# Patient Record
Sex: Female | Born: 1935 | Race: Black or African American | Hispanic: No | Smoking: Never smoker
Health system: Southern US, Community
[De-identification: ages and names within clinical notes are randomized; demographics above are authoritative.]

## PROBLEM LIST (undated history)

## (undated) DIAGNOSIS — M1990 Unspecified osteoarthritis, unspecified site: Secondary | ICD-10-CM

## (undated) DIAGNOSIS — I10 Essential (primary) hypertension: Secondary | ICD-10-CM

## (undated) HISTORY — PX: TOTAL KNEE ARTHROPLASTY: SHX125

## (undated) HISTORY — PX: JOINT REPLACEMENT: SHX530

## (undated) HISTORY — PX: ABDOMINAL HYSTERECTOMY: SHX81

---

## 2001-09-10 ENCOUNTER — Encounter: Payer: Self-pay | Admitting: Family Medicine

## 2001-09-10 ENCOUNTER — Ambulatory Visit (HOSPITAL_COMMUNITY): Admission: RE | Admit: 2001-09-10 | Discharge: 2001-09-10 | Payer: Self-pay | Admitting: Family Medicine

## 2002-03-06 ENCOUNTER — Ambulatory Visit (HOSPITAL_COMMUNITY): Admission: RE | Admit: 2002-03-06 | Discharge: 2002-03-06 | Payer: Self-pay | Admitting: Family Medicine

## 2002-03-06 ENCOUNTER — Encounter: Payer: Self-pay | Admitting: Family Medicine

## 2002-05-16 ENCOUNTER — Encounter (HOSPITAL_COMMUNITY): Admission: RE | Admit: 2002-05-16 | Discharge: 2002-06-15 | Payer: Self-pay | Admitting: Family Medicine

## 2002-09-12 ENCOUNTER — Encounter: Payer: Self-pay | Admitting: Family Medicine

## 2002-09-12 ENCOUNTER — Ambulatory Visit (HOSPITAL_COMMUNITY): Admission: RE | Admit: 2002-09-12 | Discharge: 2002-09-12 | Payer: Self-pay | Admitting: Family Medicine

## 2002-12-24 ENCOUNTER — Encounter: Payer: Self-pay | Admitting: Family Medicine

## 2002-12-24 ENCOUNTER — Ambulatory Visit (HOSPITAL_COMMUNITY): Admission: RE | Admit: 2002-12-24 | Discharge: 2002-12-24 | Payer: Self-pay | Admitting: Family Medicine

## 2003-11-28 ENCOUNTER — Encounter: Admission: RE | Admit: 2003-11-28 | Discharge: 2003-11-28 | Payer: Self-pay | Admitting: Family Medicine

## 2003-12-31 ENCOUNTER — Encounter (INDEPENDENT_AMBULATORY_CARE_PROVIDER_SITE_OTHER): Payer: Self-pay | Admitting: Family Medicine

## 2004-06-11 ENCOUNTER — Inpatient Hospital Stay (HOSPITAL_COMMUNITY): Admission: RE | Admit: 2004-06-11 | Discharge: 2004-06-18 | Payer: Self-pay | Admitting: Orthopedic Surgery

## 2004-12-16 ENCOUNTER — Ambulatory Visit: Payer: Self-pay | Admitting: Orthopedic Surgery

## 2005-06-23 ENCOUNTER — Ambulatory Visit: Payer: Self-pay | Admitting: Orthopedic Surgery

## 2006-06-05 ENCOUNTER — Ambulatory Visit (HOSPITAL_COMMUNITY): Admission: RE | Admit: 2006-06-05 | Discharge: 2006-06-05 | Payer: Self-pay | Admitting: Family Medicine

## 2006-07-20 ENCOUNTER — Ambulatory Visit: Payer: Self-pay | Admitting: Family Medicine

## 2006-08-03 ENCOUNTER — Ambulatory Visit: Payer: Self-pay | Admitting: Family Medicine

## 2006-08-31 ENCOUNTER — Ambulatory Visit: Payer: Self-pay | Admitting: Family Medicine

## 2006-09-21 ENCOUNTER — Ambulatory Visit: Payer: Self-pay | Admitting: Family Medicine

## 2006-10-30 ENCOUNTER — Ambulatory Visit: Payer: Self-pay | Admitting: Family Medicine

## 2006-11-27 ENCOUNTER — Ambulatory Visit: Payer: Self-pay | Admitting: Family Medicine

## 2006-11-29 ENCOUNTER — Encounter: Payer: Self-pay | Admitting: Family Medicine

## 2006-11-29 DIAGNOSIS — F339 Major depressive disorder, recurrent, unspecified: Secondary | ICD-10-CM | POA: Insufficient documentation

## 2006-11-29 DIAGNOSIS — F411 Generalized anxiety disorder: Secondary | ICD-10-CM | POA: Insufficient documentation

## 2006-11-29 DIAGNOSIS — E669 Obesity, unspecified: Secondary | ICD-10-CM | POA: Insufficient documentation

## 2006-11-29 DIAGNOSIS — I10 Essential (primary) hypertension: Secondary | ICD-10-CM | POA: Insufficient documentation

## 2006-11-29 DIAGNOSIS — IMO0002 Reserved for concepts with insufficient information to code with codable children: Secondary | ICD-10-CM | POA: Insufficient documentation

## 2006-11-29 DIAGNOSIS — J309 Allergic rhinitis, unspecified: Secondary | ICD-10-CM | POA: Insufficient documentation

## 2006-11-29 DIAGNOSIS — F329 Major depressive disorder, single episode, unspecified: Secondary | ICD-10-CM | POA: Insufficient documentation

## 2006-11-29 DIAGNOSIS — M1990 Unspecified osteoarthritis, unspecified site: Secondary | ICD-10-CM | POA: Insufficient documentation

## 2007-01-26 ENCOUNTER — Telehealth (INDEPENDENT_AMBULATORY_CARE_PROVIDER_SITE_OTHER): Payer: Self-pay | Admitting: Family Medicine

## 2007-02-07 ENCOUNTER — Encounter (INDEPENDENT_AMBULATORY_CARE_PROVIDER_SITE_OTHER): Payer: Self-pay | Admitting: Family Medicine

## 2007-02-08 LAB — CONVERTED CEMR LAB
ALT: 19 units/L (ref 0–35)
AST: 20 units/L (ref 0–37)
Basophils Absolute: 0 10*3/uL (ref 0.0–0.1)
Basophils Relative: 0 % (ref 0–1)
Chloride: 105 meq/L (ref 96–112)
Creatinine, Ser: 0.87 mg/dL (ref 0.40–1.20)
Hemoglobin: 13.5 g/dL (ref 12.0–15.0)
Lymphocytes Relative: 35 % (ref 12–46)
MCHC: 32.4 g/dL (ref 30.0–36.0)
Monocytes Absolute: 0.5 10*3/uL (ref 0.2–0.7)
Neutro Abs: 3.9 10*3/uL (ref 1.7–7.7)
Platelets: 385 10*3/uL (ref 150–400)
RDW: 13.2 % (ref 11.5–14.0)
Sodium: 143 meq/L (ref 135–145)
TSH: 1.193 microintl units/mL (ref 0.350–5.50)
Total Bilirubin: 0.4 mg/dL (ref 0.3–1.2)
Total CHOL/HDL Ratio: 3.3
Total Protein: 7.4 g/dL (ref 6.0–8.3)
VLDL: 18 mg/dL (ref 0–40)

## 2007-02-26 ENCOUNTER — Ambulatory Visit: Payer: Self-pay | Admitting: Family Medicine

## 2007-02-26 LAB — CONVERTED CEMR LAB
Cholesterol, target level: 200 mg/dL
LDL Goal: 130 mg/dL

## 2007-03-02 ENCOUNTER — Encounter (INDEPENDENT_AMBULATORY_CARE_PROVIDER_SITE_OTHER): Payer: Self-pay | Admitting: Family Medicine

## 2007-03-05 ENCOUNTER — Ambulatory Visit (HOSPITAL_COMMUNITY): Admission: RE | Admit: 2007-03-05 | Discharge: 2007-03-05 | Payer: Self-pay | Admitting: Family Medicine

## 2007-03-05 ENCOUNTER — Encounter (INDEPENDENT_AMBULATORY_CARE_PROVIDER_SITE_OTHER): Payer: Self-pay | Admitting: Family Medicine

## 2007-03-06 ENCOUNTER — Encounter (INDEPENDENT_AMBULATORY_CARE_PROVIDER_SITE_OTHER): Payer: Self-pay | Admitting: Family Medicine

## 2007-03-06 DIAGNOSIS — E042 Nontoxic multinodular goiter: Secondary | ICD-10-CM | POA: Insufficient documentation

## 2007-03-12 ENCOUNTER — Telehealth (INDEPENDENT_AMBULATORY_CARE_PROVIDER_SITE_OTHER): Payer: Self-pay | Admitting: Family Medicine

## 2007-03-19 ENCOUNTER — Telehealth (INDEPENDENT_AMBULATORY_CARE_PROVIDER_SITE_OTHER): Payer: Self-pay | Admitting: Family Medicine

## 2007-03-30 ENCOUNTER — Ambulatory Visit: Payer: Self-pay | Admitting: Family Medicine

## 2007-04-17 ENCOUNTER — Encounter (INDEPENDENT_AMBULATORY_CARE_PROVIDER_SITE_OTHER): Payer: Self-pay | Admitting: Family Medicine

## 2007-05-03 ENCOUNTER — Encounter (INDEPENDENT_AMBULATORY_CARE_PROVIDER_SITE_OTHER): Payer: Self-pay | Admitting: Family Medicine

## 2007-05-22 ENCOUNTER — Encounter (INDEPENDENT_AMBULATORY_CARE_PROVIDER_SITE_OTHER): Payer: Self-pay | Admitting: Family Medicine

## 2007-06-11 ENCOUNTER — Telehealth (INDEPENDENT_AMBULATORY_CARE_PROVIDER_SITE_OTHER): Payer: Self-pay | Admitting: Family Medicine

## 2007-06-12 ENCOUNTER — Encounter (INDEPENDENT_AMBULATORY_CARE_PROVIDER_SITE_OTHER): Payer: Self-pay | Admitting: Family Medicine

## 2007-06-29 ENCOUNTER — Ambulatory Visit: Payer: Self-pay | Admitting: Family Medicine

## 2007-06-30 ENCOUNTER — Encounter (INDEPENDENT_AMBULATORY_CARE_PROVIDER_SITE_OTHER): Payer: Self-pay | Admitting: Family Medicine

## 2007-07-01 LAB — CONVERTED CEMR LAB
Basophils Absolute: 0 10*3/uL (ref 0.0–0.1)
Basophils Relative: 0 % (ref 0–1)
Calcium: 8.8 mg/dL (ref 8.4–10.5)
Eosinophils Absolute: 0.5 10*3/uL (ref 0.0–0.7)
Eosinophils Relative: 7 % — ABNORMAL HIGH (ref 0–5)
Glucose, Bld: 77 mg/dL (ref 70–99)
HCT: 37.9 % (ref 36.0–46.0)
MCHC: 33 g/dL (ref 30.0–36.0)
MCV: 94.8 fL (ref 78.0–100.0)
Platelets: 278 10*3/uL (ref 150–400)
RDW: 14 % (ref 11.5–14.0)
Sodium: 142 meq/L (ref 135–145)

## 2007-07-02 ENCOUNTER — Telehealth (INDEPENDENT_AMBULATORY_CARE_PROVIDER_SITE_OTHER): Payer: Self-pay | Admitting: *Deleted

## 2007-07-13 ENCOUNTER — Ambulatory Visit: Payer: Self-pay | Admitting: Family Medicine

## 2007-09-17 ENCOUNTER — Ambulatory Visit: Payer: Self-pay | Admitting: Family Medicine

## 2007-09-17 DIAGNOSIS — K219 Gastro-esophageal reflux disease without esophagitis: Secondary | ICD-10-CM | POA: Insufficient documentation

## 2007-09-18 ENCOUNTER — Encounter (INDEPENDENT_AMBULATORY_CARE_PROVIDER_SITE_OTHER): Payer: Self-pay | Admitting: Family Medicine

## 2007-10-09 ENCOUNTER — Ambulatory Visit: Payer: Self-pay | Admitting: Family Medicine

## 2007-10-18 ENCOUNTER — Telehealth (INDEPENDENT_AMBULATORY_CARE_PROVIDER_SITE_OTHER): Payer: Self-pay | Admitting: Family Medicine

## 2007-11-22 ENCOUNTER — Ambulatory Visit: Payer: Self-pay | Admitting: Family Medicine

## 2007-12-04 ENCOUNTER — Encounter (INDEPENDENT_AMBULATORY_CARE_PROVIDER_SITE_OTHER): Payer: Self-pay | Admitting: Family Medicine

## 2007-12-17 ENCOUNTER — Encounter (INDEPENDENT_AMBULATORY_CARE_PROVIDER_SITE_OTHER): Payer: Self-pay | Admitting: Family Medicine

## 2008-01-01 ENCOUNTER — Encounter (INDEPENDENT_AMBULATORY_CARE_PROVIDER_SITE_OTHER): Payer: Self-pay | Admitting: Family Medicine

## 2008-02-04 ENCOUNTER — Encounter (INDEPENDENT_AMBULATORY_CARE_PROVIDER_SITE_OTHER): Payer: Self-pay | Admitting: Family Medicine

## 2008-02-21 ENCOUNTER — Ambulatory Visit: Payer: Self-pay | Admitting: Family Medicine

## 2008-03-01 ENCOUNTER — Encounter (INDEPENDENT_AMBULATORY_CARE_PROVIDER_SITE_OTHER): Payer: Self-pay | Admitting: Family Medicine

## 2008-03-02 LAB — CONVERTED CEMR LAB
Albumin: 4.1 g/dL (ref 3.5–5.2)
CO2: 25 meq/L (ref 19–32)
Cholesterol: 184 mg/dL (ref 0–200)
Eosinophils Relative: 3 % (ref 0–5)
Glucose, Bld: 89 mg/dL (ref 70–99)
HCT: 41.9 % (ref 36.0–46.0)
Hemoglobin: 13.6 g/dL (ref 12.0–15.0)
Lymphocytes Relative: 37 % (ref 12–46)
Lymphs Abs: 2.3 10*3/uL (ref 0.7–4.0)
Monocytes Relative: 7 % (ref 3–12)
Platelets: 319 10*3/uL (ref 150–400)
RBC: 4.32 M/uL (ref 3.87–5.11)
Sodium: 142 meq/L (ref 135–145)
Total Bilirubin: 0.5 mg/dL (ref 0.3–1.2)
Total Protein: 7.2 g/dL (ref 6.0–8.3)
Triglycerides: 67 mg/dL (ref <150)
VLDL: 13 mg/dL (ref 0–40)
WBC: 6.3 10*3/uL (ref 4.0–10.5)

## 2008-03-03 ENCOUNTER — Telehealth (INDEPENDENT_AMBULATORY_CARE_PROVIDER_SITE_OTHER): Payer: Self-pay | Admitting: *Deleted

## 2008-03-07 ENCOUNTER — Ambulatory Visit: Payer: Self-pay | Admitting: Family Medicine

## 2008-03-07 ENCOUNTER — Telehealth (INDEPENDENT_AMBULATORY_CARE_PROVIDER_SITE_OTHER): Payer: Self-pay | Admitting: *Deleted

## 2008-03-13 ENCOUNTER — Encounter (INDEPENDENT_AMBULATORY_CARE_PROVIDER_SITE_OTHER): Payer: Self-pay | Admitting: Family Medicine

## 2008-03-13 ENCOUNTER — Ambulatory Visit (HOSPITAL_COMMUNITY): Admission: RE | Admit: 2008-03-13 | Discharge: 2008-03-13 | Payer: Self-pay | Admitting: Family Medicine

## 2008-04-03 ENCOUNTER — Ambulatory Visit: Payer: Self-pay | Admitting: Family Medicine

## 2008-04-03 LAB — CONVERTED CEMR LAB: Hemoglobin: 13.2 g/dL

## 2008-06-18 ENCOUNTER — Telehealth (INDEPENDENT_AMBULATORY_CARE_PROVIDER_SITE_OTHER): Payer: Self-pay | Admitting: *Deleted

## 2008-06-24 ENCOUNTER — Telehealth (INDEPENDENT_AMBULATORY_CARE_PROVIDER_SITE_OTHER): Payer: Self-pay | Admitting: Family Medicine

## 2008-07-08 ENCOUNTER — Ambulatory Visit: Payer: Self-pay | Admitting: Family Medicine

## 2008-08-07 ENCOUNTER — Telehealth (INDEPENDENT_AMBULATORY_CARE_PROVIDER_SITE_OTHER): Payer: Self-pay | Admitting: *Deleted

## 2008-10-06 ENCOUNTER — Ambulatory Visit: Payer: Self-pay | Admitting: Family Medicine

## 2008-10-07 ENCOUNTER — Encounter (INDEPENDENT_AMBULATORY_CARE_PROVIDER_SITE_OTHER): Payer: Self-pay | Admitting: Family Medicine

## 2008-10-07 LAB — CONVERTED CEMR LAB
CO2: 23 meq/L (ref 19–32)
Creatinine, Ser: 0.81 mg/dL (ref 0.40–1.20)
Eosinophils Relative: 7 % — ABNORMAL HIGH (ref 0–5)
Glucose, Bld: 78 mg/dL (ref 70–99)
HCT: 36.3 % (ref 36.0–46.0)
Hemoglobin: 11.9 g/dL — ABNORMAL LOW (ref 12.0–15.0)
Lymphocytes Relative: 43 % (ref 12–46)
Lymphs Abs: 1.9 10*3/uL (ref 0.7–4.0)
Monocytes Absolute: 0.3 10*3/uL (ref 0.1–1.0)
Total Bilirubin: 0.3 mg/dL (ref 0.3–1.2)
Total Protein: 6.5 g/dL (ref 6.0–8.3)
WBC: 4.4 10*3/uL (ref 4.0–10.5)

## 2008-10-27 ENCOUNTER — Telehealth (INDEPENDENT_AMBULATORY_CARE_PROVIDER_SITE_OTHER): Payer: Self-pay | Admitting: *Deleted

## 2009-01-05 ENCOUNTER — Ambulatory Visit: Payer: Self-pay | Admitting: Family Medicine

## 2009-01-05 LAB — CONVERTED CEMR LAB: Hemoglobin: 12.8 g/dL

## 2009-02-24 ENCOUNTER — Ambulatory Visit: Payer: Self-pay | Admitting: Family Medicine

## 2009-02-24 DIAGNOSIS — R609 Edema, unspecified: Secondary | ICD-10-CM | POA: Insufficient documentation

## 2009-04-06 ENCOUNTER — Ambulatory Visit: Payer: Self-pay | Admitting: Family Medicine

## 2009-04-07 ENCOUNTER — Encounter (INDEPENDENT_AMBULATORY_CARE_PROVIDER_SITE_OTHER): Payer: Self-pay | Admitting: Family Medicine

## 2009-04-07 LAB — CONVERTED CEMR LAB
ALT: 18 units/L (ref 0–35)
AST: 30 units/L (ref 0–37)
Albumin: 3.8 g/dL (ref 3.5–5.2)
BUN: 21 mg/dL (ref 6–23)
Calcium: 9 mg/dL (ref 8.4–10.5)
Chloride: 109 meq/L (ref 96–112)
Eosinophils Relative: 8 % — ABNORMAL HIGH (ref 0–5)
HCT: 36.9 % (ref 36.0–46.0)
HDL: 67 mg/dL (ref >39)
Hemoglobin: 12.1 g/dL (ref 12.0–15.0)
LDL Cholesterol: 110 mg/dL — ABNORMAL HIGH (ref 0–99)
Lymphocytes Relative: 36 % (ref 12–46)
Lymphs Abs: 1.6 10*3/uL (ref 0.7–4.0)
Neutro Abs: 2.2 10*3/uL (ref 1.7–7.7)
Platelets: 286 10*3/uL (ref 150–400)
Potassium: 4 meq/L (ref 3.5–5.3)
Sodium: 141 meq/L (ref 135–145)
TSH: 0.818 microintl units/mL (ref 0.350–4.500)
Total Protein: 7.2 g/dL (ref 6.0–8.3)
WBC: 4.5 10*3/uL (ref 4.0–10.5)

## 2009-04-08 ENCOUNTER — Encounter (INDEPENDENT_AMBULATORY_CARE_PROVIDER_SITE_OTHER): Payer: Self-pay | Admitting: Family Medicine

## 2009-05-12 ENCOUNTER — Telehealth (INDEPENDENT_AMBULATORY_CARE_PROVIDER_SITE_OTHER): Payer: Self-pay | Admitting: *Deleted

## 2009-05-19 ENCOUNTER — Ambulatory Visit: Payer: Self-pay | Admitting: Family Medicine

## 2009-07-09 ENCOUNTER — Encounter (INDEPENDENT_AMBULATORY_CARE_PROVIDER_SITE_OTHER): Payer: Self-pay | Admitting: Family Medicine

## 2009-07-13 ENCOUNTER — Ambulatory Visit: Payer: Self-pay | Admitting: Family Medicine

## 2009-07-13 DIAGNOSIS — H8110 Benign paroxysmal vertigo, unspecified ear: Secondary | ICD-10-CM | POA: Insufficient documentation

## 2009-07-14 LAB — CONVERTED CEMR LAB
CO2: 22 meq/L (ref 19–32)
Chloride: 108 meq/L (ref 96–112)
Potassium: 3.6 meq/L (ref 3.5–5.3)
Sodium: 144 meq/L (ref 135–145)

## 2011-05-06 NOTE — Discharge Summary (Signed)
NAME:  Dana Ellis, Dana Ellis                         ACCOUNT NO.:  [id number]   MEDICAL RECORD NO.:  [id number]                   PATIENT TYPE:  INP   LOCATION:  A340                                 FACILITY:  [facility]   PHYSICIAN:  Carole Civil, M.D.           DATE OF BIRTH:  11/16/1934   DATE OF ADMISSION:  06/10/2004  DATE OF DISCHARGE:                                 DISCHARGE SUMMARY   Ms. [name] was admitted on June 23.  She underwent uncomplicated  right total knee.  She tolerated that well.  She had a good postoperative  course with no complications.  She was going to go to rehabilitation; they  did not have a bed, so we extended her stay for two days and she will be  discharged.   ADMITTING DIAGNOSIS:  Osteoarthritis, right knee.   DISCHARGE DIAGNOSIS:  Osteoarthritis, right knee.   Discharging hemoglobin was 9.5, potassium was 3.8.   Follow-up should be one week after discharge from the hospital to have her  staples taken out.  She will have home health and home physical therapy.  She is weightbearing as tolerated.  She will be on Lovenox 40 mg q. day for  three weeks.   DISCHARGE MEDICATIONS:  1. Norvasc 10 mg a day.  2. Feosol 45 mg b.i.d.  3. Ativan 0.5 mg t.i.d.  4. Skelaxin 800 mg t.i.d.  5. Zaroxolyn 2.5 mg daily.  6. Protonix 40 mg daily.  7. Potassium 20 mEq b.i.d.  8. Darvocet one every four hours.   DISPOSITION:  Home.   CONDITION:  Improved.     ___________________________________________                                         Carole Civil, M.D.   SEH/MEDQ  D:  06/18/2004  T:  06/18/2004  Job:  [id number]

## 2011-05-06 NOTE — H&P (Signed)
NAME:  Dana Ellis, Dana Ellis                         ACCOUNT NO.:  [id number]   MEDICAL RECORD NO.:  [id number]                   PATIENT TYPE:  AMB   LOCATION:  DAY                                  FACILITY:  [facility]   PHYSICIAN:  Carole Civil, M.D.           DATE OF BIRTH:  1934/06/20   DATE OF ADMISSION:  DATE OF DISCHARGE:                                HISTORY & PHYSICAL   CHIEF COMPLAINT:  Right knee pain.   HISTORY:  Dana Ellis is now 77 years old.  She is complaining of right  knee pain for several years, worsening in severity.  Located primarily on  the lateral compartment.  The pain has an aching, dull quality to it.  It  has become severe and is constant.  It is in the setting of osteoarthritis  modified by activity.  The latest signs and symptoms are crepitus and  deformity of the limb.   REVIEW OF SYSTEMS:  Poor gait pattern.  Thyroid disease.  Anxiety.  Allergies and sinusitis.  General, respiratory, GI, urinary, skin, ears,  nose, and throat, lymphatic, no complaints.  CARDIOVASCULAR:  Complains of  circulation problems related to coolness in the extremities.   ALLERGIES:  ACE inhibitors.   PAST MEDICAL HISTORY:  1. Hypertension.  2. Arthritis.  3. Joint swelling.   PAST SURGICAL HISTORY:  Left total knee replacement done several years ago  by myself.   PHARMACY:  [facility].   LISTED MEDICATIONS:  Norvasc.  Skelaxin.  Evista.  Lorazepam.  Potassium.  Zaroxylan.  Arthrotec.  Aspirin.  Prevacid.  Tylenol w/Codeine.  Bisoprolol  HCTZ.   FAMILY HISTORY:  Noted.  Heart disease.  Arthritis.   SOCIAL HISTORY:  Family history, Dr. Berdine Addison.  Marital status:  Single.  Employment:  CNA.  Social habits:  None.  Caffeine use:  Yes.  Highest grade  completed in school:  12.   PHYSICAL EXAMINATION:  VITAL SIGNS:  Weight 248.  Pulse 78, respiratory rate  20.  GENERAL APPEARANCE:  Normal development and nutrition.  Knee deformity is  noted.  Grooming normal.  Body  habitus:  Medium-to-large frame.  CARDIOVASCULAR:  Observation.  No swelling or varicose veins.  Palpation:  Pulses and temperature normal.  No edema or tenderness.  LYMPH NODES:  In the neck area, normal.  Groin area, normal.  Popliteal  space, normal.  MUSCULOSKELETAL:  Gait and station:  Valgus knee on the right, poor gait  pattern with pain.  Inspection and palpation of the knee, lateral  compartment pain and crepitus.  Malalignment with valgus:  Tenderness, as  noted.  Range of motion:  There is a flexion contracture.  She has 100  degrees of flexion.  Pain limits some of the motion.  The joint is stable.  Strength and muscle tone are normal.  The left knee had total knee  replacement incision noted in the anterior knee.  Flexion 115 degrees.  Extension near 0.  SKIN:  Otherwise normal.  NEURO:  Coordination is good.  Reflexes are normal.  Sensation, touch, and  pain normal.  MENTAL STATUS:  She is awake, alert and oriented x 3.  Mood and affect  pleasant.   DIAGNOSIS:  Osteoarthritis, right knee.   PLAN:  Right total knee replacement.  Admission x4 days at [facility].     ___________________________________________                                         Carole Civil, M.D.   SEH/MEDQ  D:  06/10/2004  T:  06/10/2004  Job:  [id number]

## 2011-05-06 NOTE — Discharge Summary (Signed)
NAME:  Dana Ellis, Dana Ellis                         ACCOUNT NO.:  [id number]   MEDICAL RECORD NO.:  [id number]                   PATIENT TYPE:  INP   LOCATION:  A340                                 FACILITY:  [facility]   PHYSICIAN:  Carole Civil, M.D.           DATE OF BIRTH:  21-Jun-1934   DATE OF ADMISSION:  06/11/2004  DATE OF DISCHARGE:                                 DISCHARGE SUMMARY   ADMISSION DIAGNOSIS:  Osteoarthritis of the right knee.   DISCHARGE DIAGNOSIS:  Osteoarthritis of the right knee.   PROCEDURE:  Right total knee replacement performed on June 11, 2004.  Right  total knee arthroplasty, Osteonics Scorpio total knee, posterior stabilizer.  Components:  Size 7 femur, 7 tibia, 5 patella and 10 mm insert with a flexed  configuration.   ESTIMATED BLOOD LOSS:  Zero.   TOURNIQUET TIME:  1 hour and 29 minutes.   ANESTHESIA:  Spinal anesthesia was used.   HOSPITAL COURSE:  The patient was admitted on June 11, 2004 and had an  uncomplicated total knee replacement.  She was admitted back to the floor  and followed a routine postoperative total knee protocol and progressed  normally.  She was referred for rehab and approved.   DISCHARGE MEDICATIONS:  1. Norvasc 10 mg daily.  2. Lovenox 30 mg subcutaneous q.12h until discharged from rehab.  3. Ativan 0.5 mg t.i.d.  4. Skelaxin 800 mg t.i.d.  5. Zaroxolyn 2.5 mg daily.  6. Protonix 40 mg daily.  7. Potassium 20 mEq b.i.d.  8. Darvocet one q.4h.  9. Evista 60 mg daily.  10.      Reglan 10 mg q.6h p.r.n.   SPECIAL INSTRUCTIONS:  1. Staples out on postoperative day number twelve which will be June 23, 2004.  2. Follow postoperative total knee protocol.  3. CPM advance as tolerated.  4. Full weight bearing.  5. Passive and active assisted range of motion exercises.   FOLLOW UP:  Follow-up in the office in the fourth week of July.  Call [phone] for an appointment.     ___________________________________________                                         Carole Civil, M.D.   SEH/MEDQ  D:  06/15/2004  T:  06/15/2004  Job:  [id number]

## 2011-05-06 NOTE — Op Note (Signed)
NAME:  Dana Ellis, Dana Ellis                         ACCOUNT NO.:  [id number]   MEDICAL RECORD NO.:  [id number]                   PATIENT TYPE:  AMB   LOCATION:  DAY                                  FACILITY:  [facility]   PHYSICIAN:  Carole Civil, M.D.           DATE OF BIRTH:  09/28/1934   DATE OF PROCEDURE:  06/11/2004  DATE OF DISCHARGE:                                 OPERATIVE REPORT   INDICATIONS FOR PROCEDURE:  Knee pain.   HISTORY:  This is a 77 year old female status post left total knee  presenting with right knee pain, end-stage arthritis.  Failed conservative  care and presented for a right total knee replacement.   PREOPERATIVE DIAGNOSIS:  Osteoarthritis of the right knee.   POSTOPERATIVE DIAGNOSIS:  Osteoarthritis of the right knee.   PROCEDURE:  Right total knee arthroplasty.   COMPONENTS USED:  [employer] Osteonics Scorpio total knee and posterior  stabilized knee replacement.  We used a 7 femur, 7 tibia, 5 patella, and a  10-mm polyethylene insert with a flexed configuration.   SURGEON:  Carole Civil, M.D.   FINDINGS:  Degenerative arthritis of the cartilaginous surfaces of the  tibia, femur, and patella as well as approximately 10 loose bodies.   ESTIMATED BLOOD LOSS:  Zero.   TOURNIQUET TIME:  1 hour and 29 minutes.   COUNTS:  Sponge and needle counts were correct.   DESCRIPTION OF PROCEDURE:  In the preoperative holding area, the medical  record was reviewed, including the consent and history.  My initials were  placed over the surgical site as indicated by the patient prior to any  sedatives given.  Preoperative antibiotics of 1 g of Ancef were given, and  the patient was taken to the operating suite.   The patient was given a spinal anesthetic and placed supine on the operating  table, and a Foley catheter was placed.  The right knee was prepped and  draped using sterile technique, and a time out was taken.  Everyone  concurred on the procedure of  right total knee replacement on Dana Ellis.  The right lower extremity was exsanguinated with a 4-inch Esmarch,  and the tourniquet was inflated to 300 mmHg.   An incision was made over the right knee centered over the patella.  The  dissection was carried through the subcutaneous tissues to the extensor  mechanism.  The joint was entered via medial arthrotomy.  A complete  synovectomy was done.  The cruciates and menisci were removed.  A medial  periosteal sleeve was elevated.  The knee was flexed to 90 degrees and  subluxated forward.   The tibial guide was set to neutral alignment with a 5-degree posterior  slope.  The stylus was set for 10 mm resection from the normal medial side.  I measured the tibial base plate to a size 7.  The femoral canal was then  entered with  a three-eighths inch drill bit and decompressed with suction  and fluted guide rod.  A 10-mm distal resection was made.  The femur was  measured to a size 7, and the four-in-one cutting block was used to make  four femoral cuts with 3 degrees of external rotation.  The box cut was  made.   A trial reduction was done.  The cuts were acceptable.  The tibial punches  were made.  The patella was measured for thickness and cut down to a size  12.  A total of 11 mm of bone was resected.  A trial was placed and the  tracking was checked.  No lateral release was necessary.   The knee was irrigated.  The bone was dried.  The prosthesis was cemented  into place using a size 7 femur, size 7 tibia, size 5 patella, 12-mm  polyethylene flexed insert.   A Hemovac drain was placed along with a subcutaneous pain pump.  The knee  was closed in layers with #1 Bralon, 0 and 2-0 Vicryl, and skin staples.  CryoCuff was placed over sterile dressings.   The patient was moved to a regular bed and taken to the recovery room in  stable condition.   POSTOPERATIVE PLANS:  Routine postoperative total knee protocol.       ___________________________________________                                            Carole Civil, M.D.   SEH/MEDQ  D:  06/11/2004  T:  06/11/2004  Job:  [id number]

## 2011-05-06 NOTE — Group Therapy Note (Signed)
NAME:  Dana Ellis, Dana Ellis                         ACCOUNT NO.:  [id number]   MEDICAL RECORD NO.:  [id number]                   PATIENT TYPE:  INP   LOCATION:  A340                                 FACILITY:  [facility]   PHYSICIAN:  Carole Civil, M.D.           DATE OF BIRTH:  [birth year]   DATE OF PROCEDURE:  06/13/2004  DATE OF DISCHARGE:                                   PROGRESS NOTE   SUBJECTIVE:  Hemoglobin today is 9.4.  Preoperative hemoglobin was 12.9.  Potassium is 2.7.  Hemovac drain was removed.  Pain pump catheter is still  functioning.  Cuff is in place.  Knee immobilizer was in place.  The patient  wore CPM nine hours yesterday.   ASSESSMENT:  Pain level is acceptable.   PLAN:  Recommend 40 mEq of potassium x2 and then continue with b.i.d.  potassium.  Cuff can be removed at this time and used on a p.r.n. basis.      ___________________________________________                                            Carole Civil, M.D.   SEH/MEDQ  D:  06/13/2004  T:  06/13/2004  Job:  [id number]

## 2011-05-06 NOTE — Group Therapy Note (Signed)
NAME:  Dana Ellis, Dana Ellis                         ACCOUNT NO.:  [id number]   MEDICAL RECORD NO.:  [id number]                   PATIENT TYPE:  INP   LOCATION:  A340                                 FACILITY:  [facility]   PHYSICIAN:  Carole Civil, M.D.           DATE OF BIRTH:  1934/03/13   DATE OF PROCEDURE:  06/12/2004  DATE OF DISCHARGE:                                   PROGRESS NOTE   CHIEF COMPLAINT:  Right total knee replacement.   HISTORY:  This is a 77 year old female who is postop day #1 status post  right total knee replacement.  She is doing well.  She did not like the way  Vicodin made her feel but she is having minimal pain.  She is taking Tylenol  and has hit her PCA pump on 2 occasions.   Hemoglobin is 10.7.  Her knee looks good.  Her drain output is decreasing.  She is neurovascularly intact.  She is awake and alert.   Recommend add iron and potassium to her medications and continue physical  therapy as per protocol.      ___________________________________________                                            Carole Civil, M.D.   SEH/MEDQ  D:  06/12/2004  T:  06/12/2004  Job:  [id number]

## 2011-05-06 NOTE — Consult Note (Signed)
NAME:  Dana Ellis, INABNIT                         ACCOUNT NO.:  [id number]   MEDICAL RECORD NO.:  [id number]                   PATIENT TYPE:  INP   LOCATION:  A340                                 FACILITY:  [facility]   PHYSICIAN:  Barrie Folk. Berdine Addison, M.D.                DATE OF BIRTH:  [birth year]   DATE OF CONSULTATION:  06/12/2004  DATE OF DISCHARGE:                                   CONSULTATION   The patient is a 77 year old widow, gravida 1, para 1, AB 0 black female  from [place], [place].  The patient was admitted by Dr. Arther Abbott, orthopedics, for total right knee replacement due to progressive  deterioration and severe pain over several years.  The patient underwent a  total right knee replacement on June 24, [id number], without complications by Dr.  Arther Abbott.   History is positive for multiple steroid injections, use of nonsteroidal  anti-inflammatory agents, and other analgesics for pain with tentative  treatment of her right knee chronic pain.  History is positive for swelling  and severe stiffness of her right knee.  The patient found it difficult to  go up stairs due to severity of stiffness of her right knee.   Medical history is also positive for hypertension, hyperlipidemia, hiatal  hernia, and surgical menopause.  Medical history is negative for diabetes,  tuberculosis, cancer, sickle cell, asthma, and seizure disorder.   PRESCRIBED MEDICATIONS:  '  1. Norvasc 10 mg p.o. daily.  2. Zaroxolyn 2.5 mg p.o. every day.  3. K-Dur 20 mEq 1-1/2 tablets p.o. every day.  4. Lorazepam 0.5 mg p.o. t.i.d.  5. Prevacid 30 mg p.o. daily.  6. Evista 60 mg p.o. daily.   ALLERGIES:  The patient is allergic to ACE INHIBITOR.   PAST MEDICAL HISTORY:  1. Hospitalization for left knee replacement by Dr. Aline Brochure in September     1998.  2. Hysterectomy at [facility].   HABITS:  Negative for tobacco, ethanol, or street drugs.   SEXUALLY TRANSMITTED DISEASES:   Negative for gonorrhea, syphilis, herpes,  and HIV infections.   FAMILY HISTORY:  Mother deceased at age 55 secondary to heart disease.  Father deceased in his 8s secondary to complications of stroke.  One sister  deceased at age 7 secondary to complications of stroke; two brothers are  living, on in his 8s with history of hypertension, colon cancer; one  brother in his 83s is in good health.  On son is living in his 56s in good  health.   REVIEW OF SYSTEMS:  Negative for epistaxis, chronic headache, chest pain,  shortness of breath, nausea, vomiting, diarrhea, constipation, syncope,  dizziness, melena, gross hematuria, and dysuria.  Review of Systems is  positive for episodic nervous spells, hot flashes, insomnia, etc.   PHYSICAL EXAMINATION:  GENERAL:  The patient is an elderly, overweight,  medium-height, alert, black female in no apparent  respiratory distress.  VITAL SIGNS:  Blood pressure 145/68, pulse 55, respirations 20, temperature  97.6.  HEENT:  Head: Noncontributory.  Ears:  Normal auricles.  Eyes:  Lids  negative for ptosis.  Sclerae white.  Pupils equal, round, and reactive to  light.  Extraocular movements intact.  Nose: Negative for discharge.  Mouth:  Positive upper and lower dentures.  No oral lesions.  Posterior pharynx  benign.  NECK:  Negative for lymphadenopathy or thyromegaly.  No carotid bruits on  auscultation.  LUNGS:  Clear.  HEART:  Audible S1 and S2 without murmur.  Rate equals 64 and regular.  ABDOMEN:  Obese.  Soft and nontender in all four quadrants.  Positive old  healed mid hypogastric surgical scar.  No palpable masses.  No organomegaly.  PELVIC:  Deferred.  RECTAL:  Deferred.  EXTREMITIES:  Right knee positive for orthopedic apparatus intact.  Hemovac  drainage of dark blood.  Left knee positive for old healed surgical scar.  Flex and extend without tenderness.  No discoloration of extremities.  Palpable dorsalis pedis bilaterally.   NEUROLOGIC:  Alert and oriented to person, place, and time,  Cranial nerves  II-XII appeared intact.   LABORATORY AND X-RAY DATA:  Hemoglobin 10.7, hematocrit 31.0 on June 12, 2004.  Labs of June 09, 2004: Hemoglobin 12.9, hematocrit 37.6.  Labs on  June 09, 2004:  Sodium 140, potassium 3.7, chloride 104, CO2 31, glucose 88,  BUN 11, creatinine 0.7.  Labs of June 12, 2004: Sodium 138, potassium 3.2,  chloride 102, CO2 32, glucose 117, BUN 5, creatinine 0.7.   PRIMARY IMPRESSION:  Status post acute left knee replacement secondary to  severe degenerative joint disease.   SECONDARY DIAGNOSES:  1. Mild hypokalemia.  2. Hypertension.  3. Hyperlipidemia.  4. Surgical menopause.  5. Chronic anxiety.  6. Chronic mild anemia.   PLAN:  1. Potassium supplements.  2. Watch hemoglobin and hematocrit daily as ordered by orthopedist.  3. Continue protocol for postop knee plan.  4. Norvasc 10 mg p.o. daily.  5. Zaroxolyn 2.5 mg p.o. every day.  6. Lorazepam 0.5 mg p.o. t.i.d.  7. Prevacid 30 mg p.o. daily.  8. Evista 60 mg p.o. daily.  9. Analgesics for pain.  10.      Iron supplements.  11.      Continue IV fluids.  12.      Foley catheter, watch I's and O's.  13.      Lovenox 30 mg subcutaneously b.i.d.  14.      Tylenol for temperature with fever 101 or greater.      ___________________________________________                                            Barrie Folk. Berdine Addison, M.D.   Armanda Heritage  D:  06/12/2004  T:  06/12/2004  Job:  [id number]

## 2011-05-06 NOTE — Group Therapy Note (Signed)
NAME:  Dana Ellis, Dana Ellis                         ACCOUNT NO.:  [id number]   MEDICAL RECORD NO.:  [id number]                   PATIENT TYPE:  INP   LOCATION:  A340                                 FACILITY:  [facility]   PHYSICIAN:  Carole Civil, M.D.           DATE OF BIRTH:  [birth year]   DATE OF PROCEDURE:  06/17/2004  DATE OF DISCHARGE:                                   PROGRESS NOTE   [name] was scheduled for discharge yesterday.  She is now postop day  #6.  She had a right total knee replacement.  There are no beds at [facility], and she will have to stay in the hospital until it is time for her to  be discharged to home.  She is in stable condition.  Her hemoglobin remains  in the 9.5 with a hematocrit of 27.  Potassium is 3.8.  Stable condition.  Continue physical therapy.  Advance as tolerated.      ___________________________________________                                            Carole Civil, M.D.   SEH/MEDQ  D:  06/17/2004  T:  06/17/2004  Job:  [id number]

## 2012-09-14 ENCOUNTER — Emergency Department (HOSPITAL_COMMUNITY): Payer: Medicare Other

## 2012-09-14 ENCOUNTER — Emergency Department (HOSPITAL_COMMUNITY)
Admission: EM | Admit: 2012-09-14 | Discharge: 2012-09-14 | Disposition: A | Discharge status: Home / Self Care | Payer: Medicare Other | Attending: Emergency Medicine | Admitting: Emergency Medicine

## 2012-09-14 ENCOUNTER — Encounter (HOSPITAL_COMMUNITY): Payer: Self-pay | Admitting: *Deleted

## 2012-09-14 DIAGNOSIS — Z79899 Other long term (current) drug therapy: Secondary | ICD-10-CM | POA: Insufficient documentation

## 2012-09-14 DIAGNOSIS — I10 Essential (primary) hypertension: Secondary | ICD-10-CM | POA: Insufficient documentation

## 2012-09-14 DIAGNOSIS — F0390 Unspecified dementia without behavioral disturbance: Secondary | ICD-10-CM | POA: Insufficient documentation

## 2012-09-14 HISTORY — DX: Essential (primary) hypertension: I10

## 2012-09-14 LAB — COMPREHENSIVE METABOLIC PANEL
Albumin: 3.7 g/dL (ref 3.5–5.2)
Alkaline Phosphatase: 97 U/L (ref 39–117)
BUN: 28 mg/dL — ABNORMAL HIGH (ref 6–23)
Chloride: 100 mEq/L (ref 96–112)
Creatinine, Ser: 1.01 mg/dL (ref 0.50–1.10)
GFR calc Af Amer: 61 mL/min — ABNORMAL LOW (ref >90)
Glucose, Bld: 115 mg/dL — ABNORMAL HIGH (ref 70–99)
Potassium: 3.5 mEq/L (ref 3.5–5.1)
Total Bilirubin: 0.3 mg/dL (ref 0.3–1.2)
Total Protein: 7.7 g/dL (ref 6.0–8.3)

## 2012-09-14 LAB — CBC WITH DIFFERENTIAL/PLATELET
Basophils Relative: 1 % (ref 0–1)
Eosinophils Absolute: 0.2 10*3/uL (ref 0.0–0.7)
HCT: 34.1 % — ABNORMAL LOW (ref 36.0–46.0)
Hemoglobin: 11.5 g/dL — ABNORMAL LOW (ref 12.0–15.0)
Lymphs Abs: 1.1 10*3/uL (ref 0.7–4.0)
MCH: 30.9 pg (ref 26.0–34.0)
MCHC: 33.7 g/dL (ref 30.0–36.0)
MCV: 91.7 fL (ref 78.0–100.0)
Monocytes Absolute: 0.2 10*3/uL (ref 0.1–1.0)
Monocytes Relative: 4 % (ref 3–12)
RBC: 3.72 MIL/uL — ABNORMAL LOW (ref 3.87–5.11)

## 2012-09-14 LAB — URINALYSIS, ROUTINE W REFLEX MICROSCOPIC
Glucose, UA: NEGATIVE mg/dL
Ketones, ur: NEGATIVE mg/dL
Leukocytes, UA: NEGATIVE
Nitrite: NEGATIVE
Protein, ur: NEGATIVE mg/dL

## 2012-09-14 NOTE — ED Provider Notes (Cosign Needed)
History   This chart was scribed for Dana Diego, MD by Shona Needles. The patient was seen in room APA04/APA04. Patient's care was started at 1824.  CSN: [id number]  Arrival date & time 09/14/12  Dana Ellis   First MD Initiated Contact with Patient 09/14/12 1838      Chief Complaint  Patient presents with  . Altered Mental Status   Patient is a 78 y.o. female presenting with altered mental status. The history is provided by the patient and a relative. No language interpreter was used.  Altered Mental Status This is a new problem. The current episode started 3 to 5 hours ago. The problem occurs constantly. The problem has not changed since onset.Pertinent negatives include no chest pain, no abdominal pain and no headaches. She has tried nothing for the symptoms. The treatment provided no relief.    Dana Ellis is a 78 y.o. female who accompanied by family presents to the Emergency Department because of 5 hours of unchanged moderate constant confusion. Family denotes associated disorientation to time and forgetfulness. Pt is typically healthy and currently displaying baseline health. Pt denies fever, chills, emesis, nausea, rash, and cough.  Pt list PCP as Dr. [name]  Past Medical History  Diagnosis Date  . Hypertension     Past Surgical History  Procedure Date  . Abdominal hysterectomy   . Joint replacement   . Total knee arthroplasty     History reviewed. No pertinent family history.  History  Substance Use Topics  . Smoking status: Never Smoker   . Smokeless tobacco: Not on file  . Alcohol Use: No   Review of Systems  Constitutional: Negative for fatigue.  HENT: Negative for congestion, sinus pressure and ear discharge.   Eyes: Negative for discharge.  Respiratory: Negative for cough.   Cardiovascular: Negative for chest pain.  Gastrointestinal: Negative for abdominal pain and diarrhea.  Genitourinary: Negative for frequency and hematuria.  Musculoskeletal: Negative  for back pain.  Skin: Negative for rash.  Neurological: Negative for seizures and headaches.  Hematological: Negative.   Psychiatric/Behavioral: Positive for confusion and altered mental status. Negative for hallucinations.    Allergies  Ace inhibitors  Home Medications   Current Outpatient Rx  Name Route Sig Dispense Refill  . ACETAMINOPHEN 500 MG PO TABS Oral Take 1,000 mg by mouth daily as needed. For pain    . ASPIRIN EC 81 MG PO TBEC Oral Take 81 mg by mouth every morning.    Marland Kitchen CALCIUM CARBONATE-VITAMIN D 500-200 MG-UNIT PO TABS Oral Take 1 tablet by mouth 2 (two) times daily.    Marland Kitchen CETIRIZINE HCL 10 MG PO TABS Oral Take 10 mg by mouth every morning.    Marland Kitchen CITALOPRAM HYDROBROMIDE 10 MG PO TABS Oral Take 10 mg by mouth every morning.    . FUROSEMIDE 40 MG PO TABS Oral Take 40 mg by mouth every morning. *Take one tablet 30 minutes after taking Metolazone*    . MELOXICAM 7.5 MG PO TABS Oral Take 7.5 mg by mouth every morning.    Marland Kitchen METOLAZONE 2.5 MG PO TABS Oral Take 2.5 mg by mouth every morning. *Take one tablet by mouth daily 30 minutes prior to taking LASIX (Furosemide)    . OLMESARTAN MEDOXOMIL 20 MG PO TABS Oral Take 20 mg by mouth every morning.    Marland Kitchen OMEPRAZOLE 20 MG PO CPDR Oral Take 20 mg by mouth every morning.    Marland Kitchen POTASSIUM CHLORIDE CRYS ER 20 MEQ PO TBCR Oral Take  20-40 mEq by mouth every morning. *Alternate taking one tablet daily and taking two tablets daily*    . PRAVASTATIN SODIUM 40 MG PO TABS Oral Take 40 mg by mouth every morning.      BP 180/70  Pulse 60  Temp 98.5 F (36.9 C) (Oral)  Resp 20  Ht 5\' 5"  (1.651 m)  SpO2 98%  Physical Exam  Constitutional: She appears well-developed and well-nourished.  HENT:  Head: Normocephalic.  Right Ear: External ear normal.  Left Ear: External ear normal.  Nose: Nose normal.  Mouth/Throat: Oropharynx is clear and moist.  Eyes: EOM are normal. Pupils are equal, round, and reactive to light. Right eye exhibits no  discharge. Left eye exhibits no discharge.  Neck: Normal range of motion. Neck supple. No tracheal deviation present.       No nuchal rigidity no meningeal signs  Cardiovascular: Normal rate and regular rhythm.   Pulmonary/Chest: Effort normal and breath sounds normal. No stridor. No respiratory distress. She has no wheezes. She has no rales.  Abdominal: Soft. She exhibits no distension and no mass. There is no tenderness. There is no rebound and no guarding.  Musculoskeletal: Normal range of motion. She exhibits no edema and no tenderness.  Neurological: She is alert. She has normal reflexes. No cranial nerve deficit. Coordination normal.       Not oriented to time.  Skin: Skin is warm. No rash noted. She is not diaphoretic. No erythema. No pallor.       No pettechia no purpura    ED Course  Procedures (including critical care time) DIAGNOSTIC STUDIES: Oxygen Saturation is 98% on room air, normal by my interpretation.    COORDINATION OF CARE: 18:42- Evaluated Pt. Pt is awake and alert, though not oriented to time. 18:48- Ordered CT Head Wo Contrast 1 time imaging. 18:48- Ordered CBC with Differential Once. 18:48- Ordered Comprehensive metabolic panel STAT. 18:48- Ordered Urinalysis, Routine w reflex microscopic Once.  Labs Reviewed  CBC WITH DIFFERENTIAL - Abnormal; Notable for the following:    RBC 3.72 (*)     Hemoglobin 11.5 (*)     HCT 34.1 (*)     All other components within normal limits  COMPREHENSIVE METABOLIC PANEL - Abnormal; Notable for the following:    Glucose, Bld 115 (*)     BUN 28 (*)     GFR calc non Af Amer 53 (*)     GFR calc Af Amer 61 (*)     All other components within normal limits  URINALYSIS, ROUTINE W REFLEX MICROSCOPIC   Ct Head Wo Contrast  09/14/2012  *RADIOLOGY REPORT*  Clinical Data: Altered mental status  CT HEAD WITHOUT CONTRAST  Technique:  Contiguous axial images were obtained from the base of the skull through the vertex without contrast.   Comparison: None.  Findings: No evidence of parenchymal hemorrhage or extra-axial fluid collection. No mass lesion, mass effect, or midline shift.  No CT evidence of acute infarction.  Cerebral volume is age appropriate.  No ventriculomegaly.  The visualized paranasal sinuses are essentially clear. The mastoid air cells are unopacified.  No evidence of calvarial fracture.  IMPRESSION: No evidence of acute intracranial abnormality.   Original Report Authenticated By: Julian Hy, M.D.      No diagnosis found.  Pt's memory improving by discharge  MDM       The chart was scribed for me under my direct supervision.  I personally performed the history, physical, and medical decision making and  all procedures in the evaluation of this patient.Dana Diego, MD 09/14/12 2026  Dana Diego, MD 10/23/12 [phone]

## 2012-09-14 NOTE — ED Notes (Addendum)
Family members say pt is confused, cannot remember if she took her meds today. Knows where she is and names of her family, but says the month is January.  Walks with a cane.

## 2013-01-26 ENCOUNTER — Emergency Department (HOSPITAL_COMMUNITY)
Admission: EM | Admit: 2013-01-26 | Discharge: 2013-01-26 | Disposition: A | Discharge status: Home / Self Care | Payer: Medicare Other | Attending: Emergency Medicine | Admitting: Emergency Medicine

## 2013-01-26 ENCOUNTER — Encounter (HOSPITAL_COMMUNITY): Payer: Self-pay

## 2013-01-26 DIAGNOSIS — N390 Urinary tract infection, site not specified: Secondary | ICD-10-CM | POA: Insufficient documentation

## 2013-01-26 DIAGNOSIS — I10 Essential (primary) hypertension: Secondary | ICD-10-CM | POA: Insufficient documentation

## 2013-01-26 DIAGNOSIS — M129 Arthropathy, unspecified: Secondary | ICD-10-CM | POA: Insufficient documentation

## 2013-01-26 DIAGNOSIS — Z7982 Long term (current) use of aspirin: Secondary | ICD-10-CM | POA: Insufficient documentation

## 2013-01-26 DIAGNOSIS — R6883 Chills (without fever): Secondary | ICD-10-CM | POA: Insufficient documentation

## 2013-01-26 DIAGNOSIS — Z79899 Other long term (current) drug therapy: Secondary | ICD-10-CM | POA: Insufficient documentation

## 2013-01-26 HISTORY — DX: Unspecified osteoarthritis, unspecified site: M19.90

## 2013-01-26 LAB — URINALYSIS, ROUTINE W REFLEX MICROSCOPIC
Glucose, UA: NEGATIVE mg/dL
Ketones, ur: NEGATIVE mg/dL
Specific Gravity, Urine: 1.02 (ref 1.005–1.030)
Urobilinogen, UA: 0.2 mg/dL (ref 0.0–1.0)
pH: 6 (ref 5.0–8.0)

## 2013-01-26 LAB — CBC WITH DIFFERENTIAL/PLATELET
Basophils Absolute: 0 10*3/uL (ref 0.0–0.1)
Basophils Relative: 0 % (ref 0–1)
Lymphocytes Relative: 9 % — ABNORMAL LOW (ref 12–46)
MCHC: 33.7 g/dL (ref 30.0–36.0)
Neutro Abs: 17.1 10*3/uL — ABNORMAL HIGH (ref 1.7–7.7)
Neutrophils Relative %: 85 % — ABNORMAL HIGH (ref 43–77)
Platelets: 456 10*3/uL — ABNORMAL HIGH (ref 150–400)
RDW: 14.7 % (ref 11.5–15.5)
WBC: 20.2 10*3/uL — ABNORMAL HIGH (ref 4.0–10.5)

## 2013-01-26 LAB — BASIC METABOLIC PANEL
CO2: 24 mEq/L (ref 19–32)
Chloride: 96 mEq/L (ref 96–112)
Creatinine, Ser: 1.49 mg/dL — ABNORMAL HIGH (ref 0.50–1.10)
GFR calc Af Amer: 38 mL/min — ABNORMAL LOW (ref >90)
Potassium: 3.9 mEq/L (ref 3.5–5.1)
Sodium: 133 mEq/L — ABNORMAL LOW (ref 135–145)

## 2013-01-26 LAB — URINE MICROSCOPIC-ADD ON

## 2013-01-26 MED ORDER — CEPHALEXIN 500 MG PO CAPS: 500.0000 mg | ORAL_CAPSULE | Freq: Four times a day (QID) | ORAL | Status: DC

## 2013-01-26 MED ORDER — CEFTRIAXONE SODIUM 1 G IJ SOLR
1.0000 g | Freq: Once | INTRAMUSCULAR | Status: AC
  Filled 2013-01-26: qty 10

## 2013-01-26 MED ORDER — SODIUM CHLORIDE 0.9 % IV SOLN
Freq: Once | INTRAVENOUS | Status: AC
  Administered 2013-01-26: 14:00:00 via INTRAVENOUS

## 2013-01-26 MED ORDER — DEXTROSE 5 % IV SOLN
INTRAVENOUS | Status: AC
  Administered 2013-01-26: 14:00:00
  Filled 2013-01-26: qty 10

## 2013-01-26 NOTE — ED Notes (Signed)
RN at bedside

## 2013-01-26 NOTE — ED Notes (Signed)
Patient is just wanting to know what her results are and how long till she will be discharged.

## 2013-01-26 NOTE — ED Provider Notes (Addendum)
History    This chart was scribed for Dana Diego, MD by [name], ED Scribe. The patient was seen in room APA10/APA10. Patient's care was started at 1302.    CSN: [id number]  Arrival date & time 01/26/13  1004   First MD Initiated Contact with Patient 01/26/13 1302      Chief Complaint  Patient presents with  . Urinary Tract Infection  . Chills   Dana Ellis is a 79 y.o. female who presents to the Emergency Department complaining of persistent, moderate dysuria for the week. She states that her urine has had a foul odor for the past week and she took OTC Azo with some relief. She states that everything she has eaten has tasted salty for the past week and has had decreased fluid intake. She also reports some chills but denies any fever. Temperature here in ED is 98.2. She denies any nausea, vomiting, abdominal pain. She states that she hasn't taken her medications due to everything tasting salty. She has not been seen for her symptoms.   Patient is a 79 y.o. female presenting with dysuria. The history is provided by the patient. No language interpreter was used.  Dysuria  This is a new problem. The current episode started more than 2 days ago. The problem occurs every urination. The problem has been gradually worsening. There has been no fever. Associated symptoms include chills. Pertinent negatives include no vomiting, no frequency and no hematuria. Treatments tried: Azo.      Past Medical History  Diagnosis Date  . Hypertension   . Arthritis     Past Surgical History  Procedure Laterality Date  . Abdominal hysterectomy    . Joint replacement    . Total knee arthroplasty      No family history on file.  History  Substance Use Topics  . Smoking status: Never Smoker   . Smokeless tobacco: Not on file  . Alcohol Use: No    OB History   Grav Para Term Preterm Abortions TAB SAB Ect Mult Living                  Review of Systems  Constitutional:  Positive for chills. Negative for fever and fatigue.  HENT: Negative for congestion, sinus pressure and ear discharge.   Eyes: Negative for discharge.  Respiratory: Negative for cough.   Cardiovascular: Negative for chest pain.  Gastrointestinal: Negative for vomiting, abdominal pain and diarrhea.  Genitourinary: Positive for dysuria. Negative for frequency and hematuria.       Malodorous urine.   Musculoskeletal: Negative for back pain.  Skin: Negative for rash.  Neurological: Negative for seizures and headaches.  Psychiatric/Behavioral: Negative for hallucinations.  All other systems reviewed and are negative.    Allergies  Ace inhibitors  Home Medications   Current Outpatient Rx  Name  Route  Sig  Dispense  Refill  . acetaminophen (TYLENOL) 500 MG tablet   Oral   Take 1,000 mg by mouth daily as needed. For pain         . aspirin EC 81 MG tablet   Oral   Take 81 mg by mouth every morning.         . calcium-vitamin D (OSCAL WITH D) 500-200 MG-UNIT per tablet   Oral   Take 1 tablet by mouth 2 (two) times daily.         . cetirizine (ZYRTEC) 10 MG tablet   Oral   Take 10 mg by  mouth every morning.         . citalopram (CELEXA) 10 MG tablet   Oral   Take 10 mg by mouth every morning.         . furosemide (LASIX) 40 MG tablet   Oral   Take 40 mg by mouth every morning. *Take one tablet 30 minutes after taking Metolazone*         . meloxicam (MOBIC) 7.5 MG tablet   Oral   Take 7.5 mg by mouth every morning.         . metolazone (ZAROXOLYN) 2.5 MG tablet   Oral   Take 2.5 mg by mouth every morning. *Take one tablet by mouth daily 30 minutes prior to taking LASIX (Furosemide)         . olmesartan (BENICAR) 20 MG tablet   Oral   Take 20 mg by mouth every morning.         Marland Kitchen omeprazole (PRILOSEC) 20 MG capsule   Oral   Take 20 mg by mouth every morning.         . potassium chloride SA (K-DUR,KLOR-CON) 20 MEQ tablet   Oral   Take 20-40 mEq by  mouth every morning. *Alternate taking one tablet daily and taking two tablets daily*         . pravastatin (PRAVACHOL) 40 MG tablet   Oral   Take 40 mg by mouth every morning.           BP 123/58  Pulse 73  Temp(Src) 98.2 F (36.8 C) (Oral)  Resp 18  SpO2 98%  Physical Exam  Nursing note and vitals reviewed. Constitutional: She is oriented to person, place, and time. She appears well-developed.  HENT:  Head: Normocephalic and atraumatic.  Mouth/Throat: Mucous membranes are dry.  Eyes: Conjunctivae and EOM are normal. No scleral icterus.  Neck: Neck supple. No thyromegaly present.  Cardiovascular: Normal rate and regular rhythm.  Exam reveals no gallop and no friction rub.   No murmur heard. Pulmonary/Chest: No stridor. She has no wheezes. She has no rales. She exhibits no tenderness.  Abdominal: She exhibits no distension. There is no tenderness. There is no rebound.  Musculoskeletal: Normal range of motion. She exhibits edema.  2+ edema in ankles bilaterally.   Lymphadenopathy:    She has no cervical adenopathy.  Neurological: She is oriented to person, place, and time. Coordination normal.  Skin: No rash noted. No erythema.  Psychiatric: She has a normal mood and affect. Her behavior is normal.    ED Course  Procedures (including critical care time)  DIAGNOSTIC STUDIES: Oxygen Saturation is 98% on room air, normal by my interpretation.    COORDINATION OF CARE:  13:10-Discussed planned course of treatment with the patient including IV abx, who is agreeable at this time.   13:15-Medication Orders: Ceftriaxone (Rocephin) 1 g in dextrose 5% 50 mL IVPB  Results for orders placed during the hospital encounter of 01/26/13  CBC WITH DIFFERENTIAL      Result Value Range   WBC 20.2 (*) 4.0 - 10.5 K/uL   RBC 3.18 (*) 3.87 - 5.11 MIL/uL   Hemoglobin 9.3 (*) 12.0 - 15.0 g/dL   HCT 27.6 (*) 36.0 - 46.0 %   MCV 86.8  78.0 - 100.0 fL   MCH 29.2  26.0 - 34.0 pg   MCHC  33.7  30.0 - 36.0 g/dL   RDW 14.7  11.5 - 15.5 %   Platelets 456 (*) 150 - 400 K/uL  Neutrophils Relative 85 (*) 43 - 77 %   Neutro Abs 17.1 (*) 1.7 - 7.7 K/uL   Lymphocytes Relative 9 (*) 12 - 46 %   Lymphs Abs 1.8  0.7 - 4.0 K/uL   Monocytes Relative 5  3 - 12 %   Monocytes Absolute 1.1 (*) 0.1 - 1.0 K/uL   Eosinophils Relative 1  0 - 5 %   Eosinophils Absolute 0.3  0.0 - 0.7 K/uL   Basophils Relative 0  0 - 1 %   Basophils Absolute 0.0  0.0 - 0.1 K/uL  BASIC METABOLIC PANEL      Result Value Range   Sodium 133 (*) 135 - 145 mEq/L   Potassium 3.9  3.5 - 5.1 mEq/L   Chloride 96  96 - 112 mEq/L   CO2 24  19 - 32 mEq/L   Glucose, Bld 119 (*) 70 - 99 mg/dL   BUN 29 (*) 6 - 23 mg/dL   Creatinine, Ser 1.49 (*) 0.50 - 1.10 mg/dL   Calcium 9.3  8.4 - 10.5 mg/dL   GFR calc non Af Amer 33 (*) >90 mL/min   GFR calc Af Amer 38 (*) >90 mL/min  URINALYSIS, ROUTINE W REFLEX MICROSCOPIC      Result Value Range   Color, Urine YELLOW  YELLOW   APPearance CLOUDY (*) CLEAR   Specific Gravity, Urine 1.020  1.005 - 1.030   pH 6.0  5.0 - 8.0   Glucose, UA NEGATIVE  NEGATIVE mg/dL   Hgb urine dipstick LARGE (*) NEGATIVE   Bilirubin Urine SMALL (*) NEGATIVE   Ketones, ur NEGATIVE  NEGATIVE mg/dL   Protein, ur 100 (*) NEGATIVE mg/dL   Urobilinogen, UA 0.2  0.0 - 1.0 mg/dL   Nitrite NEGATIVE  NEGATIVE   Leukocytes, UA LARGE (*) NEGATIVE  URINE MICROSCOPIC-ADD ON      Result Value Range   WBC, UA TOO NUMEROUS TO COUNT  <3 WBC/hpf   RBC / HPF TOO NUMEROUS TO COUNT  <3 RBC/hpf   Bacteria, UA MANY (*) RARE   No results found.   No diagnosis found.    MDM      The chart was scribed for me under my direct supervision.  I personally performed the history, physical, and medical decision making and all procedures in the evaluation of this patient..     Pt improving and wants to go home Dana Diego, MD 01/26/13 [place], MD 01/26/13 [phone]

## 2013-01-26 NOTE — ED Notes (Signed)
1. Foul odor to urine for 1 week.  Took 2 days of otc azo because she felt better then stopped. 2. Chills off/on for 1 week 3. Food tastes salty for 1 week 4. Not eating well for the last week Has been unable to see pmd, no fever that she could tell.

## 2013-01-29 LAB — URINE CULTURE

## 2013-01-30 NOTE — ED Notes (Signed)
+   Urine Patient treated with Keflex-sensitive to same-chart appended per protocol MD. 

## 2013-03-26 ENCOUNTER — Ambulatory Visit (INDEPENDENT_AMBULATORY_CARE_PROVIDER_SITE_OTHER): Payer: Medicare Other | Admitting: Orthopedic Surgery

## 2013-03-26 ENCOUNTER — Encounter: Payer: Self-pay | Admitting: Orthopedic Surgery

## 2013-03-26 ENCOUNTER — Ambulatory Visit (INDEPENDENT_AMBULATORY_CARE_PROVIDER_SITE_OTHER): Payer: Medicare Other

## 2013-03-26 VITALS — BP 170/80 | Ht 64.0 in | Wt 215.0 lb

## 2013-03-26 DIAGNOSIS — M7020 Olecranon bursitis, unspecified elbow: Secondary | ICD-10-CM

## 2013-03-26 DIAGNOSIS — M7022 Olecranon bursitis, left elbow: Secondary | ICD-10-CM | POA: Insufficient documentation

## 2013-03-26 DIAGNOSIS — M25529 Pain in unspecified elbow: Secondary | ICD-10-CM

## 2013-03-26 DIAGNOSIS — M25522 Pain in left elbow: Secondary | ICD-10-CM | POA: Insufficient documentation

## 2013-03-26 NOTE — Progress Notes (Signed)
Patient ID: Dana Ellis, female   DOB: [birth year], 79 y.o.   MRN: [id number] Chief Complaint  Patient presents with  . Arm Pain    Left arm pain fell about 3 weeks ago, but doesn't remember hurting arm    History 79 year-old female status post bilateral knee replacements presents with pain and swelling and a mass over the left arm near the elbow without any trauma she did fall but she her knees at that time she complains of some numbness tingling and swelling over the left elbow and proximal forearm symptoms have been present for about 2 months came on gradually  She has a history of arthritis hypertension she's had knee replacements she is noted to have diabetes and heart disease and arthritis as part of her family history she's divorced has no social habits  She complains of some eye pain changes in her skin seasonal allergies but denies chest pain shortness of breath or heartburn  General appearance is normal, the patient is alert and oriented x3 with normal mood and affect. BP 170/80  Ht 5\' 4"  (1.626 m)  Wt 215 lb (97.523 kg)  BMI 36.89 kg/m2 Left elbow is swollen and tender it looks like she is a ruptured olecranon bursitis as the bursa is swollen and it tracks to the proximal forearm and there is a fluid wave range of motion not affected, stability tests are normal strength is normal skin is normal pulses are normal sensation is normal  X-ray shows soft tissue swelling with some arthritis in the radiocapitellar joint  Impression bursitis left elbow  She would like some pain medicine for her knees and on Tylenol No. 3  When she returns we'll check her elbow again and an x-ray both of her knee replacement since this hasn't been done in a long time and she did have a fall although her knee was actually rock stable with no swelling  Procedure note Aspiration left elbow Diagnosis left olecranon bursitis Verbal consent and timeout was taken to confirm left elbow procedure  site  Sterile alcohol prep, Medications at the chloride for anesthesia  After cleaning the area aspirated about 30 cc of bloody fluid  Sterile dressing and Ace wrap were placed  Return in about 3 weeks

## 2013-03-26 NOTE — Patient Instructions (Signed)
Ace wrap x 48 hours

## 2013-04-23 ENCOUNTER — Ambulatory Visit (INDEPENDENT_AMBULATORY_CARE_PROVIDER_SITE_OTHER): Payer: Medicare Other

## 2013-04-23 ENCOUNTER — Ambulatory Visit (INDEPENDENT_AMBULATORY_CARE_PROVIDER_SITE_OTHER): Payer: Medicare Other | Admitting: Orthopedic Surgery

## 2013-04-23 ENCOUNTER — Encounter: Payer: Self-pay | Admitting: Orthopedic Surgery

## 2013-04-23 VITALS — BP 152/70 | Ht 64.0 in | Wt 215.0 lb

## 2013-04-23 DIAGNOSIS — Z96652 Presence of left artificial knee joint: Secondary | ICD-10-CM

## 2013-04-23 DIAGNOSIS — Z96659 Presence of unspecified artificial knee joint: Secondary | ICD-10-CM

## 2013-04-23 DIAGNOSIS — M2550 Pain in unspecified joint: Secondary | ICD-10-CM

## 2013-04-23 MED ORDER — ACETAMINOPHEN-CODEINE #3 300-30 MG PO TABS: 1.0000 | ORAL_TABLET | ORAL | Status: DC | PRN

## 2013-04-23 NOTE — Progress Notes (Signed)
Patient ID: Dana Ellis, female   DOB: 12/22/1933, 79 y.o.   MRN: [id number] Chief Complaint  Patient presents with  . Follow-up    follow up left elbow and bilateral TKA's    Bilateral total knees many many years ago  Left elbow mass status post aspiration recheck  Scheduled for x-rays of both knees is surveying studies  Patient complains pain in her left knee relieved by Tylenol #3  Mass left elbow returned  Bilateral x-rays show posterior lucency on the right knee although the knee is the one that hurts this can be followed with serial x-rays  She would like them left elbow mass were excised  She would like something for pain  Resume or refill Tylenol #3 she will call us and July to have the left elbow mass surgically excised.

## 2014-02-17 ENCOUNTER — Other Ambulatory Visit: Payer: Self-pay | Admitting: *Deleted

## 2014-02-17 DIAGNOSIS — M2550 Pain in unspecified joint: Secondary | ICD-10-CM

## 2014-02-17 MED ORDER — ACETAMINOPHEN-CODEINE #3 300-30 MG PO TABS: 1.0000 | ORAL_TABLET | ORAL | Status: DC | PRN

## 2014-11-19 ENCOUNTER — Other Ambulatory Visit: Payer: Self-pay | Admitting: *Deleted

## 2014-11-19 DIAGNOSIS — M2550 Pain in unspecified joint: Secondary | ICD-10-CM

## 2014-11-19 MED ORDER — ACETAMINOPHEN-CODEINE #3 300-30 MG PO TABS: 1.0000 | ORAL_TABLET | ORAL | Status: DC | PRN

## 2015-07-21 ENCOUNTER — Other Ambulatory Visit: Payer: Self-pay | Admitting: Orthopedic Surgery

## 2015-07-30 ENCOUNTER — Other Ambulatory Visit: Payer: Self-pay | Admitting: *Deleted

## 2015-07-30 DIAGNOSIS — M2550 Pain in unspecified joint: Secondary | ICD-10-CM

## 2015-07-30 MED ORDER — ACETAMINOPHEN-CODEINE #3 300-30 MG PO TABS: 1.0000 | ORAL_TABLET | ORAL | Status: DC | PRN

## 2015-10-19 DIAGNOSIS — H524 Presbyopia: Secondary | ICD-10-CM | POA: Diagnosis not present

## 2015-10-19 DIAGNOSIS — Z0100 Encounter for examination of eyes and vision without abnormal findings: Secondary | ICD-10-CM | POA: Diagnosis not present

## 2015-10-19 DIAGNOSIS — H2510 Age-related nuclear cataract, unspecified eye: Secondary | ICD-10-CM | POA: Diagnosis not present

## 2015-11-09 DIAGNOSIS — I10 Essential (primary) hypertension: Secondary | ICD-10-CM | POA: Diagnosis not present

## 2015-11-09 DIAGNOSIS — K219 Gastro-esophageal reflux disease without esophagitis: Secondary | ICD-10-CM | POA: Diagnosis not present

## 2015-11-09 DIAGNOSIS — R69 Illness, unspecified: Secondary | ICD-10-CM | POA: Diagnosis not present

## 2015-11-09 DIAGNOSIS — E785 Hyperlipidemia, unspecified: Secondary | ICD-10-CM | POA: Diagnosis not present

## 2015-12-23 DIAGNOSIS — I10 Essential (primary) hypertension: Secondary | ICD-10-CM | POA: Diagnosis not present

## 2015-12-23 DIAGNOSIS — N183 Chronic kidney disease, stage 3 (moderate): Secondary | ICD-10-CM | POA: Diagnosis not present

## 2015-12-23 DIAGNOSIS — E782 Mixed hyperlipidemia: Secondary | ICD-10-CM | POA: Diagnosis not present

## 2015-12-23 DIAGNOSIS — R7301 Impaired fasting glucose: Secondary | ICD-10-CM | POA: Diagnosis not present

## 2015-12-23 DIAGNOSIS — D509 Iron deficiency anemia, unspecified: Secondary | ICD-10-CM | POA: Diagnosis not present

## 2016-01-04 DIAGNOSIS — E782 Mixed hyperlipidemia: Secondary | ICD-10-CM | POA: Diagnosis not present

## 2016-01-04 DIAGNOSIS — D509 Iron deficiency anemia, unspecified: Secondary | ICD-10-CM | POA: Diagnosis not present

## 2016-01-04 DIAGNOSIS — N184 Chronic kidney disease, stage 4 (severe): Secondary | ICD-10-CM | POA: Diagnosis not present

## 2016-01-04 DIAGNOSIS — R001 Bradycardia, unspecified: Secondary | ICD-10-CM | POA: Diagnosis not present

## 2016-01-04 DIAGNOSIS — R7301 Impaired fasting glucose: Secondary | ICD-10-CM | POA: Diagnosis not present

## 2016-01-04 DIAGNOSIS — I10 Essential (primary) hypertension: Secondary | ICD-10-CM | POA: Diagnosis not present

## 2016-01-04 DIAGNOSIS — M159 Polyosteoarthritis, unspecified: Secondary | ICD-10-CM | POA: Diagnosis not present

## 2016-03-14 ENCOUNTER — Other Ambulatory Visit: Payer: Self-pay | Admitting: *Deleted

## 2016-03-14 DIAGNOSIS — M2550 Pain in unspecified joint: Secondary | ICD-10-CM

## 2016-03-14 MED ORDER — ACETAMINOPHEN-CODEINE #3 300-30 MG PO TABS: 1.0000 | ORAL_TABLET | ORAL | Status: DC | PRN

## 2016-04-15 DIAGNOSIS — M4806 Spinal stenosis, lumbar region: Secondary | ICD-10-CM | POA: Diagnosis not present

## 2016-04-15 DIAGNOSIS — M1712 Unilateral primary osteoarthritis, left knee: Secondary | ICD-10-CM | POA: Diagnosis not present

## 2016-04-15 DIAGNOSIS — M1711 Unilateral primary osteoarthritis, right knee: Secondary | ICD-10-CM | POA: Diagnosis not present

## 2016-06-02 DIAGNOSIS — M170 Bilateral primary osteoarthritis of knee: Secondary | ICD-10-CM | POA: Diagnosis not present

## 2016-06-02 DIAGNOSIS — M21239 Flexion deformity, unspecified wrist: Secondary | ICD-10-CM | POA: Diagnosis not present

## 2016-06-02 DIAGNOSIS — M545 Low back pain: Secondary | ICD-10-CM | POA: Diagnosis not present

## 2016-06-02 DIAGNOSIS — M5136 Other intervertebral disc degeneration, lumbar region: Secondary | ICD-10-CM | POA: Diagnosis not present

## 2016-06-03 DIAGNOSIS — M170 Bilateral primary osteoarthritis of knee: Secondary | ICD-10-CM | POA: Diagnosis not present

## 2016-06-03 DIAGNOSIS — M5136 Other intervertebral disc degeneration, lumbar region: Secondary | ICD-10-CM | POA: Diagnosis not present

## 2016-06-03 DIAGNOSIS — M21239 Flexion deformity, unspecified wrist: Secondary | ICD-10-CM | POA: Diagnosis not present

## 2016-06-03 DIAGNOSIS — M545 Low back pain: Secondary | ICD-10-CM | POA: Diagnosis not present

## 2016-06-20 DIAGNOSIS — E782 Mixed hyperlipidemia: Secondary | ICD-10-CM | POA: Diagnosis not present

## 2016-06-20 DIAGNOSIS — D509 Iron deficiency anemia, unspecified: Secondary | ICD-10-CM | POA: Diagnosis not present

## 2016-06-20 DIAGNOSIS — R7301 Impaired fasting glucose: Secondary | ICD-10-CM | POA: Diagnosis not present

## 2016-06-27 DIAGNOSIS — R001 Bradycardia, unspecified: Secondary | ICD-10-CM | POA: Diagnosis not present

## 2016-06-27 DIAGNOSIS — N184 Chronic kidney disease, stage 4 (severe): Secondary | ICD-10-CM | POA: Diagnosis not present

## 2016-06-27 DIAGNOSIS — M159 Polyosteoarthritis, unspecified: Secondary | ICD-10-CM | POA: Diagnosis not present

## 2016-06-27 DIAGNOSIS — E782 Mixed hyperlipidemia: Secondary | ICD-10-CM | POA: Diagnosis not present

## 2016-06-27 DIAGNOSIS — I10 Essential (primary) hypertension: Secondary | ICD-10-CM | POA: Diagnosis not present

## 2016-06-27 DIAGNOSIS — L309 Dermatitis, unspecified: Secondary | ICD-10-CM | POA: Diagnosis not present

## 2016-06-27 DIAGNOSIS — Z0000 Encounter for general adult medical examination without abnormal findings: Secondary | ICD-10-CM | POA: Diagnosis not present

## 2016-06-27 DIAGNOSIS — R7301 Impaired fasting glucose: Secondary | ICD-10-CM | POA: Diagnosis not present

## 2016-06-27 DIAGNOSIS — D509 Iron deficiency anemia, unspecified: Secondary | ICD-10-CM | POA: Diagnosis not present

## 2016-07-01 DIAGNOSIS — M545 Low back pain: Secondary | ICD-10-CM | POA: Diagnosis not present

## 2016-07-01 DIAGNOSIS — M7531 Calcific tendinitis of right shoulder: Secondary | ICD-10-CM | POA: Diagnosis not present

## 2016-07-04 DIAGNOSIS — I739 Peripheral vascular disease, unspecified: Secondary | ICD-10-CM | POA: Diagnosis not present

## 2016-07-04 DIAGNOSIS — M2041 Other hammer toe(s) (acquired), right foot: Secondary | ICD-10-CM | POA: Diagnosis not present

## 2016-07-04 DIAGNOSIS — M2042 Other hammer toe(s) (acquired), left foot: Secondary | ICD-10-CM | POA: Diagnosis not present

## 2016-07-04 DIAGNOSIS — M79671 Pain in right foot: Secondary | ICD-10-CM | POA: Diagnosis not present

## 2016-07-28 DIAGNOSIS — M7531 Calcific tendinitis of right shoulder: Secondary | ICD-10-CM | POA: Diagnosis not present

## 2016-07-29 DIAGNOSIS — M545 Low back pain: Secondary | ICD-10-CM | POA: Diagnosis not present

## 2016-09-12 DIAGNOSIS — M2041 Other hammer toe(s) (acquired), right foot: Secondary | ICD-10-CM | POA: Diagnosis not present

## 2016-09-12 DIAGNOSIS — M2042 Other hammer toe(s) (acquired), left foot: Secondary | ICD-10-CM | POA: Diagnosis not present

## 2016-09-12 DIAGNOSIS — I739 Peripheral vascular disease, unspecified: Secondary | ICD-10-CM | POA: Diagnosis not present

## 2016-09-12 DIAGNOSIS — M79671 Pain in right foot: Secondary | ICD-10-CM | POA: Diagnosis not present

## 2016-10-03 DIAGNOSIS — M79671 Pain in right foot: Secondary | ICD-10-CM | POA: Diagnosis not present

## 2016-10-03 DIAGNOSIS — L89892 Pressure ulcer of other site, stage 2: Secondary | ICD-10-CM | POA: Diagnosis not present

## 2016-10-06 ENCOUNTER — Telehealth: Payer: Self-pay | Admitting: Orthopedic Surgery

## 2016-10-06 NOTE — Telephone Encounter (Signed)
[facility] fax received for refill:  acetaminophen-codeine (TYLENOL #3) 300-30 MG tablet 60 tablet 5

## 2016-10-07 ENCOUNTER — Other Ambulatory Visit: Payer: Self-pay | Admitting: *Deleted

## 2016-10-07 DIAGNOSIS — M2550 Pain in unspecified joint: Secondary | ICD-10-CM

## 2016-10-07 MED ORDER — ACETAMINOPHEN-CODEINE #3 300-30 MG PO TABS: 1.0000 | ORAL_TABLET | ORAL | 5 refills | Status: DC | PRN

## 2016-10-07 NOTE — Telephone Encounter (Signed)
yes

## 2016-10-07 NOTE — Telephone Encounter (Signed)
Routing to Dr Harrison for approval 

## 2016-11-21 DIAGNOSIS — M2041 Other hammer toe(s) (acquired), right foot: Secondary | ICD-10-CM | POA: Diagnosis not present

## 2016-11-21 DIAGNOSIS — M2042 Other hammer toe(s) (acquired), left foot: Secondary | ICD-10-CM | POA: Diagnosis not present

## 2016-11-21 DIAGNOSIS — I739 Peripheral vascular disease, unspecified: Secondary | ICD-10-CM | POA: Diagnosis not present

## 2016-11-21 DIAGNOSIS — M79671 Pain in right foot: Secondary | ICD-10-CM | POA: Diagnosis not present

## 2017-01-09 DIAGNOSIS — I10 Essential (primary) hypertension: Secondary | ICD-10-CM | POA: Diagnosis not present

## 2017-01-09 DIAGNOSIS — R7301 Impaired fasting glucose: Secondary | ICD-10-CM | POA: Diagnosis not present

## 2017-01-09 DIAGNOSIS — E782 Mixed hyperlipidemia: Secondary | ICD-10-CM | POA: Diagnosis not present

## 2017-01-16 DIAGNOSIS — M159 Polyosteoarthritis, unspecified: Secondary | ICD-10-CM | POA: Diagnosis not present

## 2017-01-16 DIAGNOSIS — K219 Gastro-esophageal reflux disease without esophagitis: Secondary | ICD-10-CM | POA: Diagnosis not present

## 2017-01-16 DIAGNOSIS — N182 Chronic kidney disease, stage 2 (mild): Secondary | ICD-10-CM | POA: Diagnosis not present

## 2017-01-16 DIAGNOSIS — R7301 Impaired fasting glucose: Secondary | ICD-10-CM | POA: Diagnosis not present

## 2017-01-16 DIAGNOSIS — E782 Mixed hyperlipidemia: Secondary | ICD-10-CM | POA: Diagnosis not present

## 2017-01-16 DIAGNOSIS — D509 Iron deficiency anemia, unspecified: Secondary | ICD-10-CM | POA: Diagnosis not present

## 2017-01-16 DIAGNOSIS — R001 Bradycardia, unspecified: Secondary | ICD-10-CM | POA: Diagnosis not present

## 2017-01-16 DIAGNOSIS — I10 Essential (primary) hypertension: Secondary | ICD-10-CM | POA: Diagnosis not present

## 2017-01-30 DIAGNOSIS — I739 Peripheral vascular disease, unspecified: Secondary | ICD-10-CM | POA: Diagnosis not present

## 2017-01-30 DIAGNOSIS — M2042 Other hammer toe(s) (acquired), left foot: Secondary | ICD-10-CM | POA: Diagnosis not present

## 2017-01-30 DIAGNOSIS — M2041 Other hammer toe(s) (acquired), right foot: Secondary | ICD-10-CM | POA: Diagnosis not present

## 2017-01-30 DIAGNOSIS — M79671 Pain in right foot: Secondary | ICD-10-CM | POA: Diagnosis not present

## 2017-02-04 ENCOUNTER — Emergency Department (HOSPITAL_COMMUNITY): Payer: Medicare HMO

## 2017-02-04 ENCOUNTER — Emergency Department (HOSPITAL_COMMUNITY)
Admission: EM | Admit: 2017-02-04 | Discharge: 2017-02-04 | Disposition: A | Discharge status: Home / Self Care | Payer: Medicare HMO | Attending: Emergency Medicine | Admitting: Emergency Medicine

## 2017-02-04 ENCOUNTER — Encounter (HOSPITAL_COMMUNITY): Payer: Self-pay | Admitting: Emergency Medicine

## 2017-02-04 DIAGNOSIS — R918 Other nonspecific abnormal finding of lung field: Secondary | ICD-10-CM | POA: Diagnosis not present

## 2017-02-04 DIAGNOSIS — N390 Urinary tract infection, site not specified: Secondary | ICD-10-CM | POA: Insufficient documentation

## 2017-02-04 DIAGNOSIS — Z79899 Other long term (current) drug therapy: Secondary | ICD-10-CM | POA: Insufficient documentation

## 2017-02-04 DIAGNOSIS — R1110 Vomiting, unspecified: Secondary | ICD-10-CM | POA: Diagnosis not present

## 2017-02-04 DIAGNOSIS — R109 Unspecified abdominal pain: Secondary | ICD-10-CM | POA: Diagnosis not present

## 2017-02-04 DIAGNOSIS — I10 Essential (primary) hypertension: Secondary | ICD-10-CM | POA: Diagnosis not present

## 2017-02-04 DIAGNOSIS — Z7982 Long term (current) use of aspirin: Secondary | ICD-10-CM | POA: Insufficient documentation

## 2017-02-04 DIAGNOSIS — R1013 Epigastric pain: Secondary | ICD-10-CM | POA: Diagnosis present

## 2017-02-04 DIAGNOSIS — R112 Nausea with vomiting, unspecified: Secondary | ICD-10-CM | POA: Diagnosis not present

## 2017-02-04 LAB — LIPASE, BLOOD: Lipase: 35 U/L (ref 11–51)

## 2017-02-04 LAB — URINALYSIS, ROUTINE W REFLEX MICROSCOPIC
BILIRUBIN URINE: NEGATIVE
Glucose, UA: NEGATIVE mg/dL
Ketones, ur: 5 mg/dL — AB
NITRITE: NEGATIVE
Protein, ur: 30 mg/dL — AB
SPECIFIC GRAVITY, URINE: 1.015 (ref 1.005–1.030)
pH: 6 (ref 5.0–8.0)

## 2017-02-04 LAB — COMPREHENSIVE METABOLIC PANEL
ALBUMIN: 3.4 g/dL — AB (ref 3.5–5.0)
ALT: 14 U/L (ref 14–54)
ANION GAP: 10 (ref 5–15)
AST: 19 U/L (ref 15–41)
Alkaline Phosphatase: 100 U/L (ref 38–126)
BUN: 15 mg/dL (ref 6–20)
CHLORIDE: 100 mmol/L — AB (ref 101–111)
CO2: 27 mmol/L (ref 22–32)
Calcium: 9.5 mg/dL (ref 8.9–10.3)
Creatinine, Ser: 0.85 mg/dL (ref 0.44–1.00)
GFR calc Af Amer: >60 mL/min (ref >60)
GFR calc non Af Amer: >60 mL/min (ref >60)
GLUCOSE: 122 mg/dL — AB (ref 65–99)
Potassium: 4.1 mmol/L (ref 3.5–5.1)
SODIUM: 137 mmol/L (ref 135–145)
Total Bilirubin: 0.5 mg/dL (ref 0.3–1.2)
Total Protein: 7.8 g/dL (ref 6.5–8.1)

## 2017-02-04 LAB — CBC
HEMATOCRIT: 36.1 % (ref 36.0–46.0)
HEMOGLOBIN: 11.8 g/dL — AB (ref 12.0–15.0)
MCH: 31.7 pg (ref 26.0–34.0)
MCHC: 32.7 g/dL (ref 30.0–36.0)
MCV: 97 fL (ref 78.0–100.0)
Platelets: 363 10*3/uL (ref 150–400)
RBC: 3.72 MIL/uL — ABNORMAL LOW (ref 3.87–5.11)
RDW: 13.2 % (ref 11.5–15.5)
WBC: 8.6 10*3/uL (ref 4.0–10.5)

## 2017-02-04 LAB — TROPONIN I

## 2017-02-04 MED ORDER — IOPAMIDOL (ISOVUE-300) INJECTION 61%
INTRAVENOUS | Status: AC
  Administered 2017-02-04: 30 mL via ORAL
  Filled 2017-02-04: qty 30

## 2017-02-04 MED ORDER — ONDANSETRON HCL 4 MG/2ML IJ SOLN
4.0000 mg | Freq: Once | INTRAMUSCULAR | Status: AC
  Administered 2017-02-04: 4 mg via INTRAVENOUS
  Filled 2017-02-04: qty 2

## 2017-02-04 MED ORDER — CEFTRIAXONE SODIUM 1 G IJ SOLR
1.0000 g | Freq: Once | INTRAMUSCULAR | Status: AC
  Administered 2017-02-04: 1 g via INTRAVENOUS
  Filled 2017-02-04: qty 10

## 2017-02-04 MED ORDER — MORPHINE SULFATE (PF) 4 MG/ML IV SOLN
4.0000 mg | Freq: Once | INTRAVENOUS | Status: AC
  Administered 2017-02-04: 4 mg via INTRAVENOUS
  Filled 2017-02-04: qty 1

## 2017-02-04 MED ORDER — SODIUM CHLORIDE 0.9 % IV SOLN
INTRAVENOUS | Status: DC
  Administered 2017-02-04: 18:00:00 via INTRAVENOUS

## 2017-02-04 MED ORDER — CEPHALEXIN 500 MG PO CAPS: 500.0000 mg | ORAL_CAPSULE | Freq: Three times a day (TID) | ORAL | 0 refills | Status: DC

## 2017-02-04 MED ORDER — IOPAMIDOL (ISOVUE-300) INJECTION 61%
100.0000 mL | Freq: Once | INTRAVENOUS | Status: AC | PRN
  Administered 2017-02-04: 100 mL via INTRAVENOUS

## 2017-02-04 NOTE — ED Provider Notes (Signed)
[facility] DEPT Provider Note   CSN: [id number] Arrival date & time: 02/04/17  1429     History   Chief Complaint Chief Complaint  Patient presents with  . Abdominal Pain    HPI Dana Ellis is a 83 y.o. female.  Pain started in the upper abdomen.  Moved to the left side.   The history is provided by the patient.  Abdominal Pain   This is a new problem. The current episode started 3 to 5 hours ago. The problem occurs constantly. The problem has been gradually worsening. The pain is located in the epigastric region. The quality of the pain is sharp and aching. The pain is severe. Associated symptoms include nausea and vomiting. Pertinent negatives include fever and dysuria. Exacerbated by: moving and coughing. Nothing relieves the symptoms. Her past medical history does not include gallstones or GERD.    Past Medical History:  Diagnosis Date  . Arthritis   . Hypertension     Patient Active Problem List   Diagnosis Date Noted  . Olecranon bursitis of left elbow 03/26/2013  . Left elbow pain 03/26/2013  . BENIGN POSITIONAL VERTIGO 07/13/2009  . PERIPHERAL EDEMA 02/24/2009  . GERD 09/17/2007  . GOITER, MULTINODULAR 03/06/2007  . OBESITY NOS 11/29/2006  . ANXIETY 11/29/2006  . DEPRESSION 11/29/2006  . HYPERTENSION 11/29/2006  . ALLERGIC RHINITIS 11/29/2006  . OSTEOARTHRITIS 11/29/2006  . DEGENERATION, DISC NOS 11/29/2006    Past Surgical History:  Procedure Laterality Date  . ABDOMINAL HYSTERECTOMY    . JOINT REPLACEMENT    . TOTAL KNEE ARTHROPLASTY      OB History    No data available       Home Medications    Prior to Admission medications   Medication Sig Start Date End Date Taking? Authorizing Provider  acetaminophen (TYLENOL) 500 MG tablet Take 1,000 mg by mouth 2 (two) times daily. For pain    Yes Historical Provider, MD  acetaminophen-codeine (TYLENOL #3) 300-30 MG tablet Take 1 tablet by mouth every 4 (four) hours as needed. Patient  taking differently: Take 1 tablet by mouth every 4 (four) hours as needed for moderate pain or severe pain.  10/07/16  Yes Carole Civil, MD  amLODipine (NORVASC) 2.5 MG tablet Take 2.5 mg by mouth daily.   Yes Historical Provider, MD  aspirin EC 81 MG tablet Take 81 mg by mouth daily.    Yes Historical Provider, MD  calcium-vitamin D (OSCAL WITH D) 500-200 MG-UNIT per tablet Take 1 tablet by mouth 2 (two) times daily.   Yes Historical Provider, MD  cetirizine (ZYRTEC) 10 MG tablet Take 10 mg by mouth daily.    Yes Historical Provider, MD  citalopram (CELEXA) 20 MG tablet Take 20 mg by mouth daily.    Yes Historical Provider, MD  ferrous sulfate 325 (65 FE) MG tablet Take 325 mg by mouth daily with breakfast.   Yes Historical Provider, MD  meclizine (ANTIVERT) 12.5 MG tablet Take 12.5 mg by mouth daily.   Yes Historical Provider, MD  metoprolol succinate (TOPROL-XL) 50 MG 24 hr tablet Take 50 mg by mouth daily. Take with or immediately following a meal.   Yes Historical Provider, MD  Multiple Vitamin (MULTIVITAMIN WITH MINERALS) TABS Take 1 tablet by mouth daily.   Yes Historical Provider, MD  olmesartan (BENICAR) 40 MG tablet Take 40 mg by mouth daily.    Yes Historical Provider, MD  potassium chloride SA (K-DUR,KLOR-CON) 20 MEQ tablet Take 20-40 mEq by mouth  every morning. *Alternate taking one tablet daily and taking two tablets daily*   Yes Historical Provider, MD  pravastatin (PRAVACHOL) 40 MG tablet Take 40 mg by mouth daily.    Yes Historical Provider, MD  cephALEXin (KEFLEX) 500 MG capsule Take 1 capsule (500 mg total) by mouth 3 (three) times daily. 02/04/17   Dorie Rank, MD  furosemide (LASIX) 40 MG tablet Take 40 mg by mouth daily. *Take one tablet 30 minutes after taking Metolazone*    Historical Provider, MD  meloxicam (MOBIC) 7.5 MG tablet Take 7.5 mg by mouth 2 (two) times daily. 01/12/17   Historical Provider, MD  metolazone (ZAROXOLYN) 2.5 MG tablet Take 2.5 mg by mouth daily.  *Take one tablet by mouth daily 30 minutes prior to taking LASIX (Furosemide)    Historical Provider, MD    Family History No family history on file.  Social History Social History  Substance Use Topics  . Smoking status: Never Smoker  . Smokeless tobacco: Never Used  . Alcohol use No     Allergies   Ace inhibitors   Review of Systems Review of Systems  Constitutional: Negative for fever.  Respiratory: Negative for shortness of breath.   Cardiovascular: Negative for chest pain.  Gastrointestinal: Positive for abdominal pain, nausea and vomiting.  Endocrine: Negative for polyuria.  Genitourinary: Negative for dysuria.  All other systems reviewed and are negative.    Physical Exam Updated Vital Signs BP 173/86   Pulse 66   Temp 97.8 F (36.6 C) (Oral)   Resp 21   Ht 5\' 4"  (1.626 m)   Wt 81.2 kg   SpO2 94%   BMI 30.73 kg/m   Physical Exam  Constitutional: She appears well-developed and well-nourished. No distress.  HENT:  Head: Normocephalic and atraumatic.  Right Ear: External ear normal.  Left Ear: External ear normal.  Eyes: Conjunctivae are normal. Right eye exhibits no discharge. Left eye exhibits no discharge. No scleral icterus.  Neck: Neck supple. No tracheal deviation present.  Cardiovascular: Normal rate, regular rhythm and intact distal pulses.   Pulmonary/Chest: Effort normal and breath sounds normal. No stridor. No respiratory distress. She has no wheezes. She has no rales.  Abdominal: Soft. Bowel sounds are normal. She exhibits no distension. There is generalized tenderness. There is no rebound and no guarding.  Musculoskeletal: She exhibits no edema or tenderness.  Neurological: She is alert. She has normal strength. No cranial nerve deficit (no facial droop, extraocular movements intact, no slurred speech) or sensory deficit. She exhibits normal muscle tone. She displays no seizure activity. Coordination normal.  Skin: Skin is warm and dry. No  rash noted.  Psychiatric: She has a normal mood and affect.  Nursing note and vitals reviewed.    ED Treatments / Results  Labs (all labs ordered are listed, but only abnormal results are displayed) Labs Reviewed  COMPREHENSIVE METABOLIC PANEL - Abnormal; Notable for the following:       Result Value   Chloride 100 (*)    Glucose, Bld 122 (*)    Albumin 3.4 (*)    All other components within normal limits  CBC - Abnormal; Notable for the following:    RBC 3.72 (*)    Hemoglobin 11.8 (*)    All other components within normal limits  URINALYSIS, ROUTINE W REFLEX MICROSCOPIC - Abnormal; Notable for the following:    APPearance CLOUDY (*)    Hgb urine dipstick SMALL (*)    Ketones, ur 5 (*)  Protein, ur 30 (*)    Leukocytes, UA LARGE (*)    Bacteria, UA MANY (*)    All other components within normal limits  URINE CULTURE  LIPASE, BLOOD  TROPONIN I    EKG  EKG Interpretation  Date/Time:  Saturday February 04 2017 19:50:11 EST Ventricular Rate:  66 PR Interval:    QRS Duration: 103 QT Interval:  422 QTC Calculation: 443 R Axis:   -7 Text Interpretation:  Sinus rhythm Atrial premature complexes Borderline prolonged PR interval Probable left atrial enlargement Left ventricular hypertrophy No old tracing to compare Confirmed by Ilhan Madan  MD-J, Jackeline Gutknecht ([id number]) on 02/04/2017 8:09:14 PM       Radiology Dg Chest 2 View  Result Date: 02/04/2017 CLINICAL DATA:  Patient with upper abdominal pain. EXAM: CHEST  2 VIEW COMPARISON:  None. FINDINGS: Low lung volumes. Cardiomegaly. Bibasilar heterogeneous opacities. Large hiatal hernia containing gas. No pleural effusion or pneumothorax. Thoracic and lumbar spine degenerative changes. IMPRESSION: Low lung volumes with basilar opacities favored to represent atelectasis. Large gas-filled hiatal hernia. Electronically Signed   By: Lovey Newcomer M.D.   On: 02/04/2017 18:17   Ct Abdomen Pelvis W Contrast  Result Date: 02/04/2017 CLINICAL DATA:   83 year old female with history of left upper abdominal pain since this morning. Nausea and vomiting. Symptoms worsen with movement and deep breathing. EXAM: CT ABDOMEN AND PELVIS WITH CONTRAST TECHNIQUE: Multidetector CT imaging of the abdomen and pelvis was performed using the standard protocol following bolus administration of intravenous contrast. CONTRAST:  1 ISOVUE-300 IOPAMIDOL (ISOVUE-300) INJECTION 61%, 144mL ISOVUE-300 IOPAMIDOL (ISOVUE-300) INJECTION 61% COMPARISON:  No priors. FINDINGS: Lower chest: Large hiatal hernia which includes not only the majority of the stomach, but also a short segment of the distal transverse colon. Atherosclerotic calcifications in the left main, left anterior descending and right coronary arteries. Hepatobiliary: No cystic or solid hepatic lesions. No intra or extrahepatic biliary ductal dilatation. Gallbladder is normal in appearance. Pancreas: No discrete pancreatic mass. However, there is fluid and inflammatory changes adjacent to the pancreas, most evident adjacent to the body and tail. The largest focal fluid collection is adjacent to the tail best demonstrated on axial image 35 of series 2 where this measures 3.4 x 4.8 cm. Spleen: Unremarkable. Adrenals/Urinary Tract: Mild multifocal cortical thinning in the kidneys bilaterally. Mild right-sided hydroureteronephrosis. Urinary bladder is moderately distended, and there is a large amount of nondependent gas in the lumen of the urinary bladder. 2.3 cm diverticulum extending off the posterolateral aspect of the urinary bladder on the right side near the right ureterovesicular junction. Bilateral adrenal glands are normal in appearance. Stomach/Bowel: Intraabdominal portion of the stomach is unremarkable in appearance. No pathologic dilatation of small bowel or colon. Several colonic diverticulae are noted, without surrounding inflammatory changes to suggest an acute diverticulitis at this time. As mentioned above, there is  a short segment of the distal transverse colon which extends into the patient's large hiatal hernia. The appendix is not confidently identified and may be surgically absent. Regardless, there are no inflammatory changes noted adjacent to the cecum to suggest the presence of an acute appendicitis at this time. Vascular/Lymphatic: Aortic atherosclerosis, without evidence of aneurysm or dissection in the abdominal or pelvic vasculature. Borderline enlarged 9 mm short axis peripancreatic lymph node near the pancreatic head is nonspecific. Several other prominent borderline enlarged retroperitoneal lymph nodes are also noted, but nonspecific. No definite lymphadenopathy noted elsewhere in the abdomen or pelvis. Reproductive: Status post hysterectomy. Ovaries are not confidently  identified may be surgically absent or atrophic. Other: Small umbilical hernia containing only omental fat. No significant volume of ascites. No pneumoperitoneum. Musculoskeletal: There are no aggressive appearing lytic or blastic lesions noted in the visualized portions of the skeleton. IMPRESSION: 1. Inflammatory changes and fluid adjacent to the pancreas concerning for an acute pancreatitis. Clinical correlation is suggested. 2. Large amount of gas in the nondependent portion of the urinary bladder. Correlation with urinalysis is recommended to exclude infection with gas-forming organisms. There is diverticular disease of the colon, but no overt findings to strongly suggest presence of colovesical fistula or other abnormality which could account for these findings. 3. Colonic diverticulosis without evidence of acute diverticulitis at this time. 4. Mild right-sided hydroureteronephrosis. This is of uncertain etiology and significance. No obstructing stone or other etiology is identified. 5. Large hiatal hernia containing the majority of the stomach as well as a short segment of distal transverse colon. 6. Aortic atherosclerosis, in addition to  left main and 2 vessel coronary artery disease. 7. Additional incidental findings, as above. Electronically Signed   By: Vinnie Langton M.D.   On: 02/04/2017 20:43    Procedures Procedures (including critical care time)  Medications Ordered in ED Medications  morphine 4 MG/ML injection 4 mg (4 mg Intravenous Given 02/04/17 1752)  ondansetron (ZOFRAN) injection 4 mg (4 mg Intravenous Given 02/04/17 1752)  cefTRIAXone (ROCEPHIN) 1 g in dextrose 5 % 50 mL IVPB (0 g Intravenous Stopped 02/04/17 2048)  iopamidol (ISOVUE-300) 61 % injection (30 mLs Oral Contrast Given 02/04/17 1910)  iopamidol (ISOVUE-300) 61 % injection 100 mL (100 mLs Intravenous Contrast Given 02/04/17 2020)     Initial Impression / Assessment and Plan / ED Course  I have reviewed the triage vital signs and the nursing notes.  Pertinent labs & imaging results that were available during my care of the patient were reviewed by me and considered in my medical decision making (see chart for details).   Labs notable for possible UTI.   CT scan with findings around the pancreas although lipase is normal.  Doubt acute pancreatitis.  Air in the bladder related to in and out cath.  Doubt gas forming organism.  Discussed admitting the patient to the hospital considering the CT scan findings.   Pt did not want to be admitted.  She wanted to go home.  Will dc home with oral abx.  Follow up with PCP.   Return to the ED for worsening symptoms  Final Clinical Impressions(s) / ED Diagnoses   Final diagnoses:  Urinary tract infection without hematuria, site unspecified    New Prescriptions Discharge Medication List as of 02/04/2017 11:25 PM    START taking these medications   Details  cephALEXin (KEFLEX) 500 MG capsule Take 1 capsule (500 mg total) by mouth 3 (three) times daily., Starting Sat 02/04/2017, Print         Dorie Rank, MD 02/06/17 [phone]

## 2017-02-04 NOTE — ED Notes (Signed)
Informed pt and family that Dr is with a critical patient and that is the reason for the delay, pt and family verbalized understanding.

## 2017-02-04 NOTE — ED Triage Notes (Signed)
Patient c/o left upper abd pain that started this morning. Patient states nausea and vomiting. Denies any diarrhea, fever, or urinary symptoms. Patient states worse with movement or deep breath.

## 2017-02-04 NOTE — ED Notes (Signed)
Pt in CT at this time.

## 2017-02-04 NOTE — Discharge Instructions (Signed)
Follow-up with your primary care doctor this week to be rechecked. As we discussed, the CT scan suggested some inflammation around her pancreas however your pancreas blood tests were normal. Return to The emergency room if you  start having any worsening symptoms fever, vomiting

## 2017-02-06 LAB — URINE CULTURE

## 2017-02-11 DIAGNOSIS — N390 Urinary tract infection, site not specified: Secondary | ICD-10-CM | POA: Diagnosis not present

## 2017-02-11 DIAGNOSIS — R932 Abnormal findings on diagnostic imaging of liver and biliary tract: Secondary | ICD-10-CM | POA: Diagnosis not present

## 2017-02-11 DIAGNOSIS — R310 Gross hematuria: Secondary | ICD-10-CM | POA: Diagnosis not present

## 2017-02-25 DIAGNOSIS — N390 Urinary tract infection, site not specified: Secondary | ICD-10-CM | POA: Diagnosis not present

## 2017-04-17 DIAGNOSIS — M2042 Other hammer toe(s) (acquired), left foot: Secondary | ICD-10-CM | POA: Diagnosis not present

## 2017-04-17 DIAGNOSIS — M79671 Pain in right foot: Secondary | ICD-10-CM | POA: Diagnosis not present

## 2017-04-17 DIAGNOSIS — M2041 Other hammer toe(s) (acquired), right foot: Secondary | ICD-10-CM | POA: Diagnosis not present

## 2017-04-17 DIAGNOSIS — I739 Peripheral vascular disease, unspecified: Secondary | ICD-10-CM | POA: Diagnosis not present

## 2017-04-27 ENCOUNTER — Other Ambulatory Visit: Payer: Self-pay | Admitting: *Deleted

## 2017-04-27 DIAGNOSIS — M2550 Pain in unspecified joint: Secondary | ICD-10-CM

## 2017-05-01 DIAGNOSIS — M2042 Other hammer toe(s) (acquired), left foot: Secondary | ICD-10-CM | POA: Diagnosis not present

## 2017-05-01 DIAGNOSIS — L89892 Pressure ulcer of other site, stage 2: Secondary | ICD-10-CM | POA: Diagnosis not present

## 2017-05-01 DIAGNOSIS — I739 Peripheral vascular disease, unspecified: Secondary | ICD-10-CM | POA: Diagnosis not present

## 2017-05-01 DIAGNOSIS — M79671 Pain in right foot: Secondary | ICD-10-CM | POA: Diagnosis not present

## 2017-05-02 ENCOUNTER — Telehealth: Payer: Self-pay | Admitting: Orthopedic Surgery

## 2017-05-02 NOTE — Telephone Encounter (Signed)
Patient's request for refill of medication from [facility] for:  Acetaminophen-COD#3 Tablet received and reviewed by Dr Aline Brochure.  Advises appointment for office visit to be scheduled.  Called patient (5/11, 5/14 no answer or answer machine.  Reached 05/02/17; states will need to wait until after her travel to [place] to tend to very ill family member.  Will call upon return.

## 2017-05-05 DIAGNOSIS — R69 Illness, unspecified: Secondary | ICD-10-CM | POA: Diagnosis not present

## 2017-05-08 DIAGNOSIS — R112 Nausea with vomiting, unspecified: Secondary | ICD-10-CM | POA: Diagnosis not present

## 2017-07-03 DIAGNOSIS — I739 Peripheral vascular disease, unspecified: Secondary | ICD-10-CM | POA: Diagnosis not present

## 2017-07-03 DIAGNOSIS — M2041 Other hammer toe(s) (acquired), right foot: Secondary | ICD-10-CM | POA: Diagnosis not present

## 2017-07-03 DIAGNOSIS — M2042 Other hammer toe(s) (acquired), left foot: Secondary | ICD-10-CM | POA: Diagnosis not present

## 2017-07-03 DIAGNOSIS — M79671 Pain in right foot: Secondary | ICD-10-CM | POA: Diagnosis not present

## 2017-07-17 DIAGNOSIS — I10 Essential (primary) hypertension: Secondary | ICD-10-CM | POA: Diagnosis not present

## 2017-07-31 DIAGNOSIS — Z0131 Encounter for examination of blood pressure with abnormal findings: Secondary | ICD-10-CM | POA: Diagnosis not present

## 2017-07-31 DIAGNOSIS — I10 Essential (primary) hypertension: Secondary | ICD-10-CM | POA: Diagnosis not present

## 2017-08-14 DIAGNOSIS — I10 Essential (primary) hypertension: Secondary | ICD-10-CM | POA: Diagnosis not present

## 2017-08-28 DIAGNOSIS — R42 Dizziness and giddiness: Secondary | ICD-10-CM | POA: Diagnosis not present

## 2017-08-28 DIAGNOSIS — I10 Essential (primary) hypertension: Secondary | ICD-10-CM | POA: Diagnosis not present

## 2017-09-25 DIAGNOSIS — M2042 Other hammer toe(s) (acquired), left foot: Secondary | ICD-10-CM | POA: Diagnosis not present

## 2017-09-25 DIAGNOSIS — I739 Peripheral vascular disease, unspecified: Secondary | ICD-10-CM | POA: Diagnosis not present

## 2017-09-25 DIAGNOSIS — M79671 Pain in right foot: Secondary | ICD-10-CM | POA: Diagnosis not present

## 2017-09-25 DIAGNOSIS — M2041 Other hammer toe(s) (acquired), right foot: Secondary | ICD-10-CM | POA: Diagnosis not present

## 2017-10-09 DIAGNOSIS — H6500 Acute serous otitis media, unspecified ear: Secondary | ICD-10-CM | POA: Diagnosis not present

## 2017-10-09 DIAGNOSIS — I10 Essential (primary) hypertension: Secondary | ICD-10-CM | POA: Diagnosis not present

## 2017-10-10 DIAGNOSIS — M1712 Unilateral primary osteoarthritis, left knee: Secondary | ICD-10-CM | POA: Diagnosis not present

## 2017-10-10 DIAGNOSIS — M1711 Unilateral primary osteoarthritis, right knee: Secondary | ICD-10-CM | POA: Diagnosis not present

## 2017-10-10 DIAGNOSIS — M545 Low back pain: Secondary | ICD-10-CM | POA: Diagnosis not present

## 2017-10-22 DIAGNOSIS — L401 Generalized pustular psoriasis: Secondary | ICD-10-CM | POA: Diagnosis not present

## 2017-11-07 DIAGNOSIS — I10 Essential (primary) hypertension: Secondary | ICD-10-CM | POA: Diagnosis not present

## 2017-11-07 DIAGNOSIS — D509 Iron deficiency anemia, unspecified: Secondary | ICD-10-CM | POA: Diagnosis not present

## 2017-11-07 DIAGNOSIS — E782 Mixed hyperlipidemia: Secondary | ICD-10-CM | POA: Diagnosis not present

## 2017-11-07 DIAGNOSIS — R7301 Impaired fasting glucose: Secondary | ICD-10-CM | POA: Diagnosis not present

## 2017-11-13 DIAGNOSIS — M159 Polyosteoarthritis, unspecified: Secondary | ICD-10-CM | POA: Diagnosis not present

## 2017-11-13 DIAGNOSIS — R69 Illness, unspecified: Secondary | ICD-10-CM | POA: Diagnosis not present

## 2017-11-13 DIAGNOSIS — N182 Chronic kidney disease, stage 2 (mild): Secondary | ICD-10-CM | POA: Diagnosis not present

## 2017-11-13 DIAGNOSIS — R7301 Impaired fasting glucose: Secondary | ICD-10-CM | POA: Diagnosis not present

## 2017-11-13 DIAGNOSIS — I10 Essential (primary) hypertension: Secondary | ICD-10-CM | POA: Diagnosis not present

## 2017-11-13 DIAGNOSIS — D509 Iron deficiency anemia, unspecified: Secondary | ICD-10-CM | POA: Diagnosis not present

## 2017-11-13 DIAGNOSIS — E782 Mixed hyperlipidemia: Secondary | ICD-10-CM | POA: Diagnosis not present

## 2017-11-13 DIAGNOSIS — I119 Hypertensive heart disease without heart failure: Secondary | ICD-10-CM | POA: Diagnosis not present

## 2017-11-13 DIAGNOSIS — K219 Gastro-esophageal reflux disease without esophagitis: Secondary | ICD-10-CM | POA: Diagnosis not present

## 2017-11-13 DIAGNOSIS — J309 Allergic rhinitis, unspecified: Secondary | ICD-10-CM | POA: Diagnosis not present

## 2017-11-17 DIAGNOSIS — L401 Generalized pustular psoriasis: Secondary | ICD-10-CM | POA: Diagnosis not present

## 2017-11-18 DIAGNOSIS — S83412A Sprain of medial collateral ligament of left knee, initial encounter: Secondary | ICD-10-CM | POA: Diagnosis not present

## 2017-11-20 DIAGNOSIS — S83412A Sprain of medial collateral ligament of left knee, initial encounter: Secondary | ICD-10-CM | POA: Diagnosis not present

## 2017-12-25 DIAGNOSIS — M79671 Pain in right foot: Secondary | ICD-10-CM | POA: Diagnosis not present

## 2017-12-25 DIAGNOSIS — M2041 Other hammer toe(s) (acquired), right foot: Secondary | ICD-10-CM | POA: Diagnosis not present

## 2017-12-25 DIAGNOSIS — I739 Peripheral vascular disease, unspecified: Secondary | ICD-10-CM | POA: Diagnosis not present

## 2017-12-25 DIAGNOSIS — M2042 Other hammer toe(s) (acquired), left foot: Secondary | ICD-10-CM | POA: Diagnosis not present

## 2018-01-29 DIAGNOSIS — E782 Mixed hyperlipidemia: Secondary | ICD-10-CM | POA: Diagnosis not present

## 2018-01-29 DIAGNOSIS — I10 Essential (primary) hypertension: Secondary | ICD-10-CM | POA: Diagnosis not present

## 2018-01-29 DIAGNOSIS — R7301 Impaired fasting glucose: Secondary | ICD-10-CM | POA: Diagnosis not present

## 2018-02-05 DIAGNOSIS — K219 Gastro-esophageal reflux disease without esophagitis: Secondary | ICD-10-CM | POA: Diagnosis not present

## 2018-02-05 DIAGNOSIS — N182 Chronic kidney disease, stage 2 (mild): Secondary | ICD-10-CM | POA: Diagnosis not present

## 2018-02-05 DIAGNOSIS — D509 Iron deficiency anemia, unspecified: Secondary | ICD-10-CM | POA: Diagnosis not present

## 2018-02-05 DIAGNOSIS — I10 Essential (primary) hypertension: Secondary | ICD-10-CM | POA: Diagnosis not present

## 2018-02-05 DIAGNOSIS — E782 Mixed hyperlipidemia: Secondary | ICD-10-CM | POA: Diagnosis not present

## 2018-02-05 DIAGNOSIS — Z6832 Body mass index (BMI) 32.0-32.9, adult: Secondary | ICD-10-CM | POA: Diagnosis not present

## 2018-02-26 DIAGNOSIS — M159 Polyosteoarthritis, unspecified: Secondary | ICD-10-CM | POA: Diagnosis not present

## 2018-03-05 DIAGNOSIS — H524 Presbyopia: Secondary | ICD-10-CM | POA: Diagnosis not present

## 2018-03-06 DIAGNOSIS — M79671 Pain in right foot: Secondary | ICD-10-CM | POA: Diagnosis not present

## 2018-03-06 DIAGNOSIS — M2041 Other hammer toe(s) (acquired), right foot: Secondary | ICD-10-CM | POA: Diagnosis not present

## 2018-03-06 DIAGNOSIS — I739 Peripheral vascular disease, unspecified: Secondary | ICD-10-CM | POA: Diagnosis not present

## 2018-03-06 DIAGNOSIS — M2042 Other hammer toe(s) (acquired), left foot: Secondary | ICD-10-CM | POA: Diagnosis not present

## 2018-04-23 DIAGNOSIS — Z6829 Body mass index (BMI) 29.0-29.9, adult: Secondary | ICD-10-CM | POA: Diagnosis not present

## 2018-04-23 DIAGNOSIS — E782 Mixed hyperlipidemia: Secondary | ICD-10-CM | POA: Diagnosis not present

## 2018-04-23 DIAGNOSIS — J309 Allergic rhinitis, unspecified: Secondary | ICD-10-CM | POA: Diagnosis not present

## 2018-04-23 DIAGNOSIS — R69 Illness, unspecified: Secondary | ICD-10-CM | POA: Diagnosis not present

## 2018-04-23 DIAGNOSIS — Z0001 Encounter for general adult medical examination with abnormal findings: Secondary | ICD-10-CM | POA: Diagnosis not present

## 2018-04-23 DIAGNOSIS — M159 Polyosteoarthritis, unspecified: Secondary | ICD-10-CM | POA: Diagnosis not present

## 2018-04-23 DIAGNOSIS — R7301 Impaired fasting glucose: Secondary | ICD-10-CM | POA: Diagnosis not present

## 2018-04-23 DIAGNOSIS — I119 Hypertensive heart disease without heart failure: Secondary | ICD-10-CM | POA: Diagnosis not present

## 2018-04-23 DIAGNOSIS — Z23 Encounter for immunization: Secondary | ICD-10-CM | POA: Diagnosis not present

## 2018-04-23 DIAGNOSIS — N182 Chronic kidney disease, stage 2 (mild): Secondary | ICD-10-CM | POA: Diagnosis not present

## 2018-04-30 DIAGNOSIS — Z6828 Body mass index (BMI) 28.0-28.9, adult: Secondary | ICD-10-CM | POA: Diagnosis not present

## 2018-04-30 DIAGNOSIS — H8149 Vertigo of central origin, unspecified ear: Secondary | ICD-10-CM | POA: Diagnosis not present

## 2018-04-30 DIAGNOSIS — J309 Allergic rhinitis, unspecified: Secondary | ICD-10-CM | POA: Diagnosis not present

## 2018-04-30 DIAGNOSIS — I10 Essential (primary) hypertension: Secondary | ICD-10-CM | POA: Diagnosis not present

## 2018-04-30 DIAGNOSIS — K219 Gastro-esophageal reflux disease without esophagitis: Secondary | ICD-10-CM | POA: Diagnosis not present

## 2018-04-30 DIAGNOSIS — Z7409 Other reduced mobility: Secondary | ICD-10-CM | POA: Diagnosis not present

## 2018-04-30 DIAGNOSIS — N182 Chronic kidney disease, stage 2 (mild): Secondary | ICD-10-CM | POA: Diagnosis not present

## 2018-04-30 DIAGNOSIS — D509 Iron deficiency anemia, unspecified: Secondary | ICD-10-CM | POA: Diagnosis not present

## 2018-04-30 DIAGNOSIS — E782 Mixed hyperlipidemia: Secondary | ICD-10-CM | POA: Diagnosis not present

## 2018-07-31 DIAGNOSIS — I119 Hypertensive heart disease without heart failure: Secondary | ICD-10-CM | POA: Diagnosis not present

## 2018-07-31 DIAGNOSIS — R7301 Impaired fasting glucose: Secondary | ICD-10-CM | POA: Diagnosis not present

## 2018-07-31 DIAGNOSIS — E782 Mixed hyperlipidemia: Secondary | ICD-10-CM | POA: Diagnosis not present

## 2018-07-31 DIAGNOSIS — R69 Illness, unspecified: Secondary | ICD-10-CM | POA: Diagnosis not present

## 2018-07-31 DIAGNOSIS — J309 Allergic rhinitis, unspecified: Secondary | ICD-10-CM | POA: Diagnosis not present

## 2018-07-31 DIAGNOSIS — M159 Polyosteoarthritis, unspecified: Secondary | ICD-10-CM | POA: Diagnosis not present

## 2018-07-31 DIAGNOSIS — Z6832 Body mass index (BMI) 32.0-32.9, adult: Secondary | ICD-10-CM | POA: Diagnosis not present

## 2018-07-31 DIAGNOSIS — N182 Chronic kidney disease, stage 2 (mild): Secondary | ICD-10-CM | POA: Diagnosis not present

## 2018-07-31 DIAGNOSIS — Z6829 Body mass index (BMI) 29.0-29.9, adult: Secondary | ICD-10-CM | POA: Diagnosis not present

## 2018-07-31 DIAGNOSIS — Z0001 Encounter for general adult medical examination with abnormal findings: Secondary | ICD-10-CM | POA: Diagnosis not present

## 2018-08-06 DIAGNOSIS — R7301 Impaired fasting glucose: Secondary | ICD-10-CM | POA: Diagnosis not present

## 2018-08-06 DIAGNOSIS — Z7409 Other reduced mobility: Secondary | ICD-10-CM | POA: Diagnosis not present

## 2018-08-06 DIAGNOSIS — M159 Polyosteoarthritis, unspecified: Secondary | ICD-10-CM | POA: Diagnosis not present

## 2018-08-06 DIAGNOSIS — I10 Essential (primary) hypertension: Secondary | ICD-10-CM | POA: Diagnosis not present

## 2018-08-06 DIAGNOSIS — R69 Illness, unspecified: Secondary | ICD-10-CM | POA: Diagnosis not present

## 2018-08-06 DIAGNOSIS — E782 Mixed hyperlipidemia: Secondary | ICD-10-CM | POA: Diagnosis not present

## 2018-08-06 DIAGNOSIS — N189 Chronic kidney disease, unspecified: Secondary | ICD-10-CM | POA: Diagnosis not present

## 2018-08-06 DIAGNOSIS — D509 Iron deficiency anemia, unspecified: Secondary | ICD-10-CM | POA: Diagnosis not present

## 2018-08-06 DIAGNOSIS — Z0000 Encounter for general adult medical examination without abnormal findings: Secondary | ICD-10-CM | POA: Diagnosis not present

## 2018-08-06 DIAGNOSIS — K219 Gastro-esophageal reflux disease without esophagitis: Secondary | ICD-10-CM | POA: Diagnosis not present

## 2018-08-14 DIAGNOSIS — M79671 Pain in right foot: Secondary | ICD-10-CM | POA: Diagnosis not present

## 2018-08-14 DIAGNOSIS — I739 Peripheral vascular disease, unspecified: Secondary | ICD-10-CM | POA: Diagnosis not present

## 2018-08-14 DIAGNOSIS — M2041 Other hammer toe(s) (acquired), right foot: Secondary | ICD-10-CM | POA: Diagnosis not present

## 2018-08-14 DIAGNOSIS — M2042 Other hammer toe(s) (acquired), left foot: Secondary | ICD-10-CM | POA: Diagnosis not present

## 2018-08-21 DIAGNOSIS — M25529 Pain in unspecified elbow: Secondary | ICD-10-CM | POA: Diagnosis not present

## 2018-08-21 DIAGNOSIS — M25572 Pain in left ankle and joints of left foot: Secondary | ICD-10-CM | POA: Diagnosis not present

## 2018-08-21 DIAGNOSIS — M25571 Pain in right ankle and joints of right foot: Secondary | ICD-10-CM | POA: Diagnosis not present

## 2018-08-21 DIAGNOSIS — M542 Cervicalgia: Secondary | ICD-10-CM | POA: Diagnosis not present

## 2018-09-08 IMAGING — CT CT ABD-PELV W/ CM
2 of 5 series · 14 of 46 positions shown, 16 images · IV contrast (iopamidol)
Comparison: No priors.

CLINICAL DATA: 82-year-old female with history of left upper
abdominal pain since this morning. Nausea and vomiting. Symptoms
worsen with movement and deep breathing.

EXAM:
CT ABDOMEN AND PELVIS WITH CONTRAST
TECHNIQUE: Multidetector CT imaging of the abdomen and pelvis was performed
using the standard protocol following bolus administration of
intravenous contrast.
CONTRAST:  1 JE90P6-9TT IOPAMIDOL (JE90P6-9TT) INJECTION 61%, 100mL
JE90P6-9TT IOPAMIDOL (JE90P6-9TT) INJECTION 61%

[Series 2: axial st · axial · 0.89mm/px · z∈[-838,-433]mm · 11 of 91 slices shown, 13 images]
[im 5/91  soft-tissue]
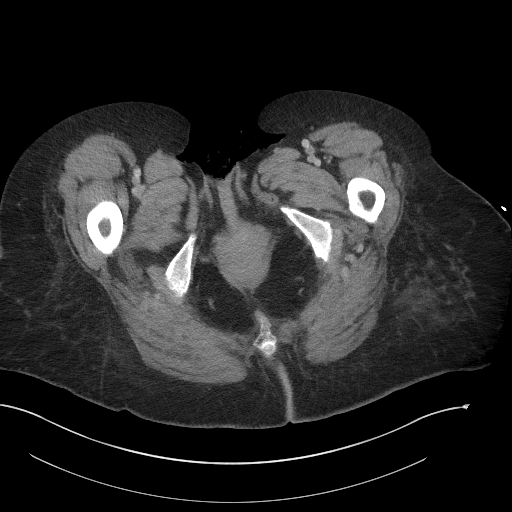
[im 5/91  bone]
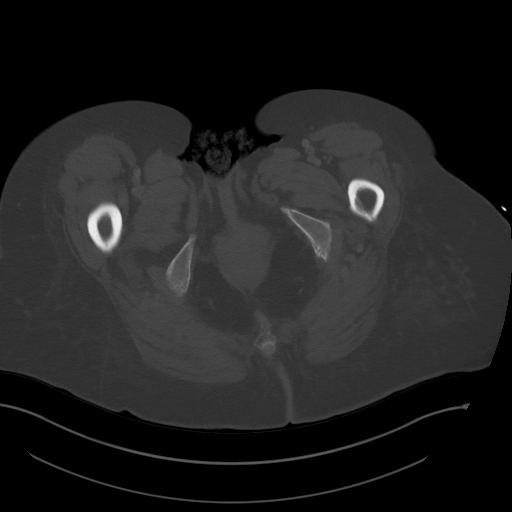
[im 15/91  soft-tissue]
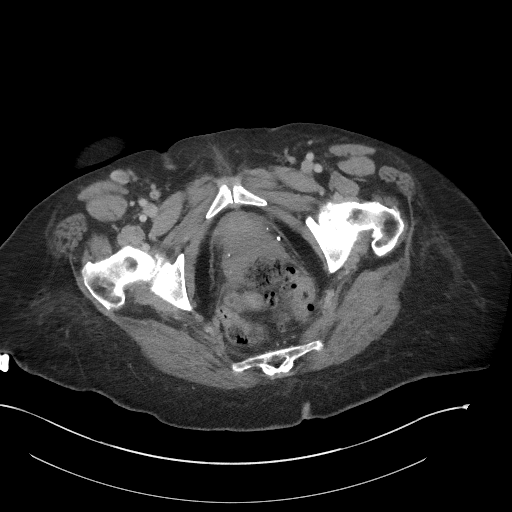
[im 24/91  soft-tissue]
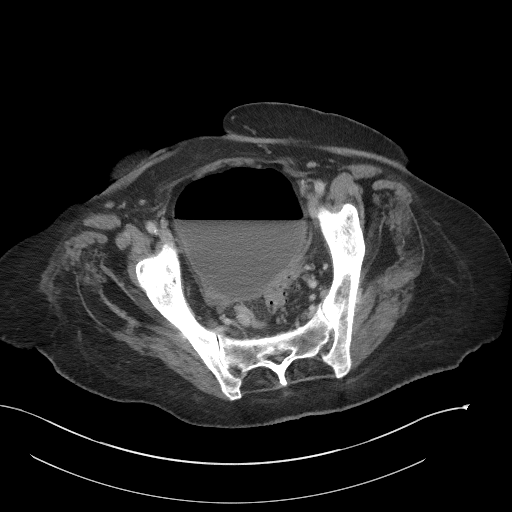
[im 29/91  soft-tissue]
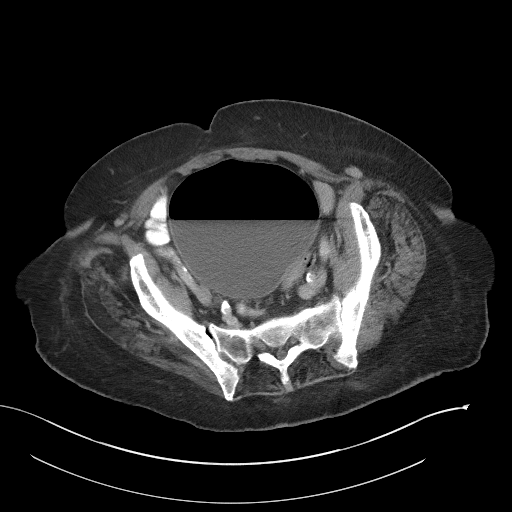
[im 38/91  soft-tissue]
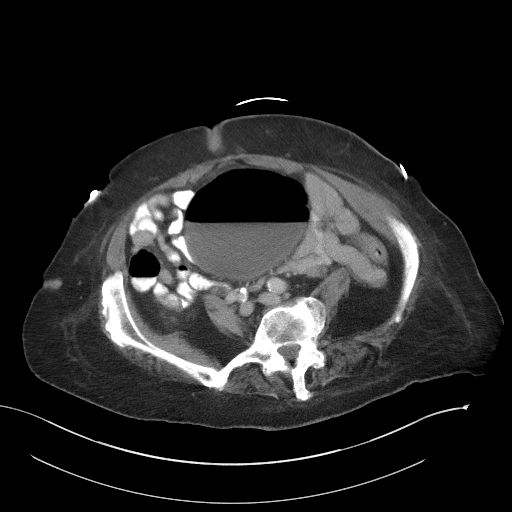
[im 48/91  soft-tissue]
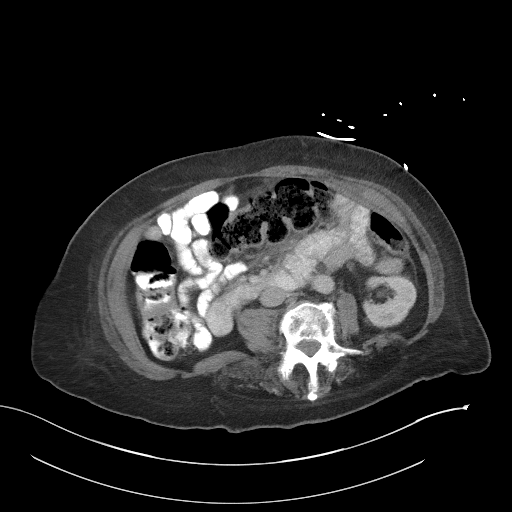
[im 53/91  soft-tissue]
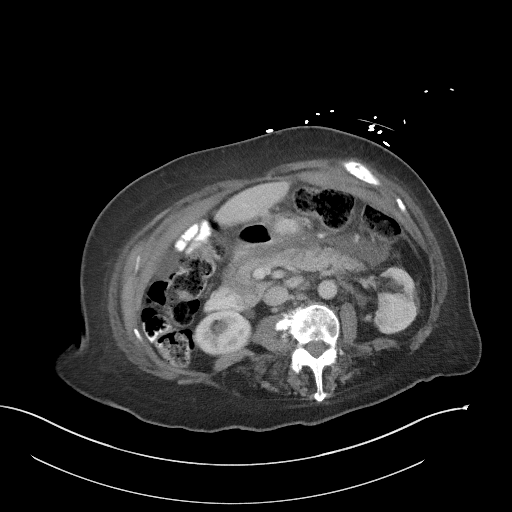
[im 62/91  soft-tissue]
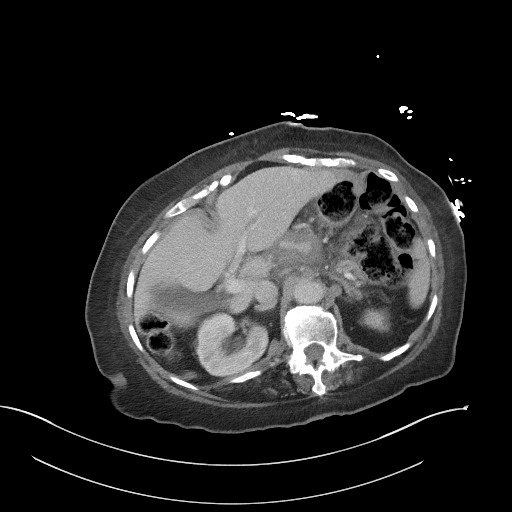
[im 67/91  soft-tissue]
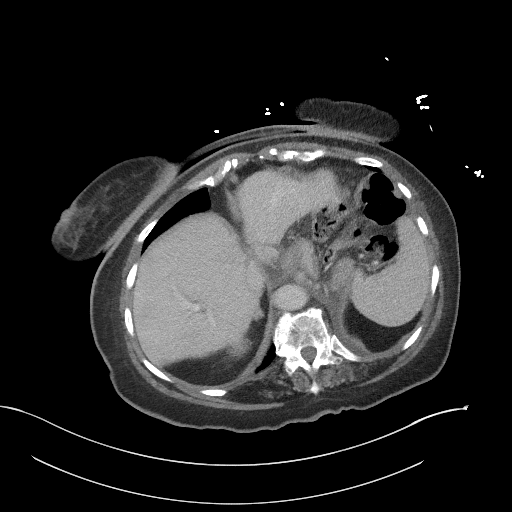
[im 67/91  bone]
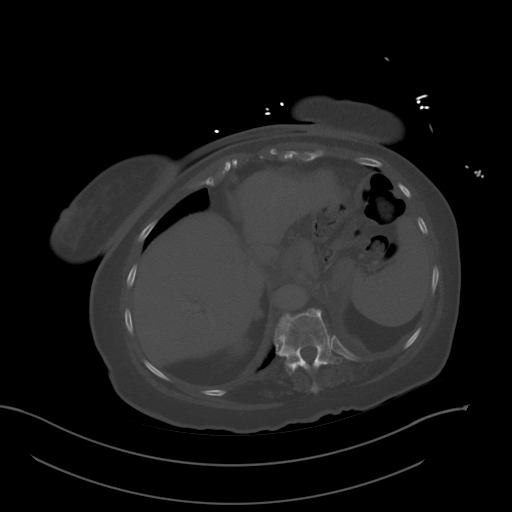
[im 76/91  soft-tissue]
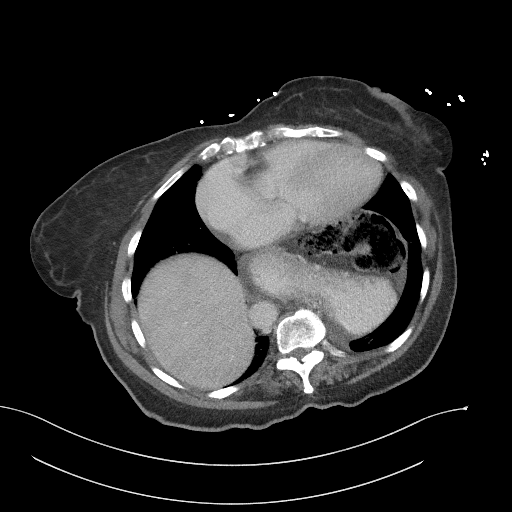
[im 86/91  soft-tissue]
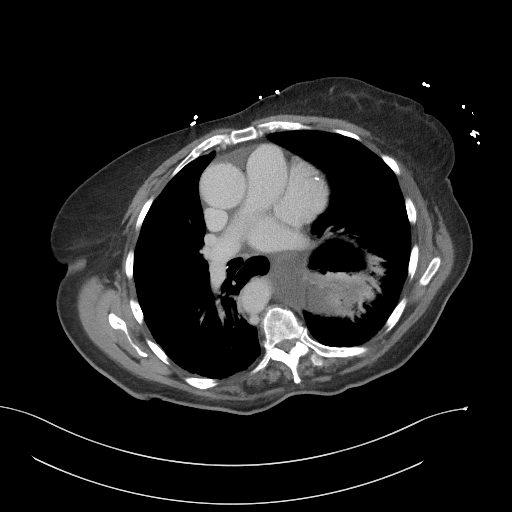

[Series 4: coronal st · coronal · 0.79mm/px · 3 of 92 slices shown]
[im 31/92  soft-tissue]
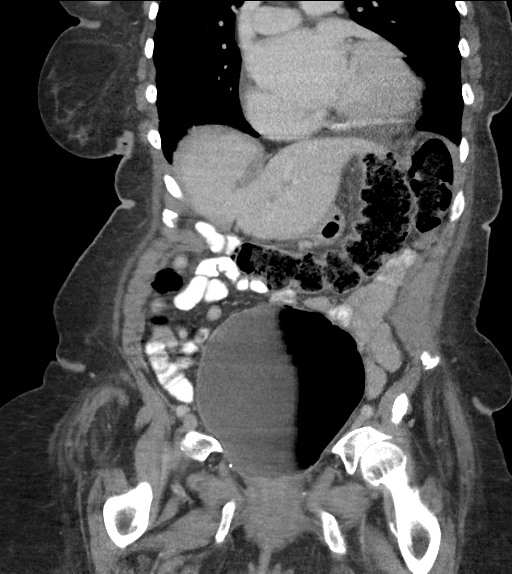
[im 41/92  soft-tissue]
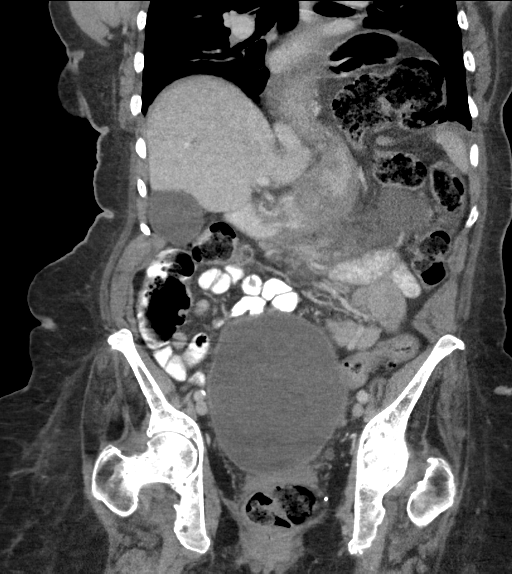
[im 51/92  soft-tissue]
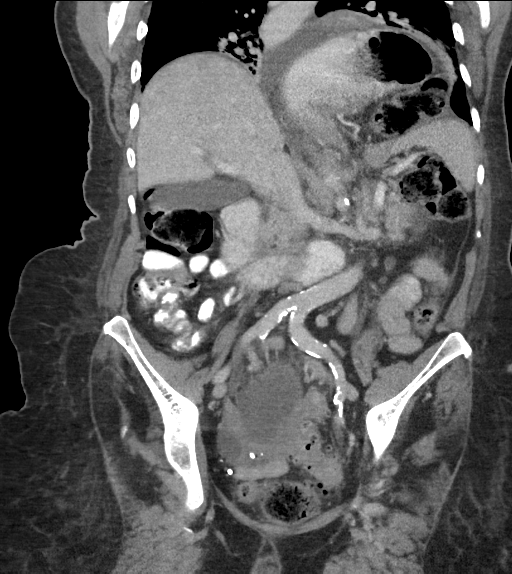

[14 of 46 positions shown; findings below may reference images not displayed]

FINDINGS: Lower chest: Large hiatal hernia which includes not only the
majority of the stomach, but also a short segment of the distal
transverse colon. Atherosclerotic calcifications in the left main,
left anterior descending and right coronary arteries.

Hepatobiliary: No cystic or solid hepatic lesions. No intra or
extrahepatic biliary ductal dilatation. Gallbladder is normal in
appearance.

Pancreas: No discrete pancreatic mass. However, there is fluid and
inflammatory changes adjacent to the pancreas, most evident adjacent
to the body and tail. The largest focal fluid collection is adjacent
to the tail best demonstrated on axial image 35 of series 2 where
this measures 3.4 x 4.8 cm.

Spleen: Unremarkable.

Adrenals/Urinary Tract: Mild multifocal cortical thinning in the
kidneys bilaterally. Mild right-sided hydroureteronephrosis. Urinary
bladder is moderately distended, and there is a large amount of
nondependent gas in the lumen of the urinary bladder. 2.3 cm
diverticulum extending off the posterolateral aspect of the urinary
bladder on the right side near the right ureterovesicular junction.
Bilateral adrenal glands are normal in appearance.

Stomach/Bowel: Intraabdominal portion of the stomach is unremarkable
in appearance. No pathologic dilatation of small bowel or colon.
Several colonic diverticulae are noted, without surrounding
inflammatory changes to suggest an acute diverticulitis at this
time. As mentioned above, there is a short segment of the distal
transverse colon which extends into the patient's large hiatal
hernia. The appendix is not confidently identified and may be
surgically absent. Regardless, there are no inflammatory changes
noted adjacent to the cecum to suggest the presence of an acute
appendicitis at this time.

Vascular/Lymphatic: Aortic atherosclerosis, without evidence of
aneurysm or dissection in the abdominal or pelvic vasculature.
Borderline enlarged 9 mm short axis peripancreatic lymph node near
the pancreatic head is nonspecific. Several other prominent
borderline enlarged retroperitoneal lymph nodes are also noted, but
nonspecific. No definite lymphadenopathy noted elsewhere in the
abdomen or pelvis.

Reproductive: Status post hysterectomy. Ovaries are not confidently
identified may be surgically absent or atrophic.

Other: Small umbilical hernia containing only omental fat. No
significant volume of ascites. No pneumoperitoneum.

Musculoskeletal: There are no aggressive appearing lytic or blastic
lesions noted in the visualized portions of the skeleton.
IMPRESSION: 1. Inflammatory changes and fluid adjacent to the pancreas
concerning for an acute pancreatitis. Clinical correlation is
suggested.
2. Large amount of gas in the nondependent portion of the urinary
bladder. Correlation with urinalysis is recommended to exclude
infection with gas-forming organisms. There is diverticular disease
of the colon, but no overt findings to strongly suggest presence of
colovesical fistula or other abnormality which could account for
these findings.
3. Colonic diverticulosis without evidence of acute diverticulitis
at this time.
4. Mild right-sided hydroureteronephrosis. This is of uncertain
etiology and significance. No obstructing stone or other etiology is
identified.
5. Large hiatal hernia containing the majority of the stomach as
well as a short segment of distal transverse colon.
6. Aortic atherosclerosis, in addition to left main and 2 vessel
coronary artery disease.
7. Additional incidental findings, as above.

## 2018-10-02 DIAGNOSIS — R69 Illness, unspecified: Secondary | ICD-10-CM | POA: Diagnosis not present

## 2018-10-09 DIAGNOSIS — E782 Mixed hyperlipidemia: Secondary | ICD-10-CM | POA: Diagnosis not present

## 2018-10-09 DIAGNOSIS — I10 Essential (primary) hypertension: Secondary | ICD-10-CM | POA: Diagnosis not present

## 2018-10-09 DIAGNOSIS — R7301 Impaired fasting glucose: Secondary | ICD-10-CM | POA: Diagnosis not present

## 2018-10-09 DIAGNOSIS — M159 Polyosteoarthritis, unspecified: Secondary | ICD-10-CM | POA: Diagnosis not present

## 2018-10-09 DIAGNOSIS — N182 Chronic kidney disease, stage 2 (mild): Secondary | ICD-10-CM | POA: Diagnosis not present

## 2018-10-09 DIAGNOSIS — N189 Chronic kidney disease, unspecified: Secondary | ICD-10-CM | POA: Diagnosis not present

## 2018-10-09 DIAGNOSIS — H814 Vertigo of central origin: Secondary | ICD-10-CM | POA: Diagnosis not present

## 2018-10-09 DIAGNOSIS — K219 Gastro-esophageal reflux disease without esophagitis: Secondary | ICD-10-CM | POA: Diagnosis not present

## 2018-10-09 DIAGNOSIS — D509 Iron deficiency anemia, unspecified: Secondary | ICD-10-CM | POA: Diagnosis not present

## 2018-10-11 DIAGNOSIS — M549 Dorsalgia, unspecified: Secondary | ICD-10-CM | POA: Diagnosis not present

## 2018-10-11 DIAGNOSIS — M25512 Pain in left shoulder: Secondary | ICD-10-CM | POA: Diagnosis not present

## 2018-10-11 DIAGNOSIS — M25511 Pain in right shoulder: Secondary | ICD-10-CM | POA: Diagnosis not present

## 2018-10-29 DIAGNOSIS — M2042 Other hammer toe(s) (acquired), left foot: Secondary | ICD-10-CM | POA: Diagnosis not present

## 2018-10-29 DIAGNOSIS — M2041 Other hammer toe(s) (acquired), right foot: Secondary | ICD-10-CM | POA: Diagnosis not present

## 2018-10-29 DIAGNOSIS — M79671 Pain in right foot: Secondary | ICD-10-CM | POA: Diagnosis not present

## 2018-10-29 DIAGNOSIS — I739 Peripheral vascular disease, unspecified: Secondary | ICD-10-CM | POA: Diagnosis not present

## 2019-01-14 DIAGNOSIS — M2041 Other hammer toe(s) (acquired), right foot: Secondary | ICD-10-CM | POA: Diagnosis not present

## 2019-01-14 DIAGNOSIS — M79672 Pain in left foot: Secondary | ICD-10-CM | POA: Diagnosis not present

## 2019-01-14 DIAGNOSIS — M79671 Pain in right foot: Secondary | ICD-10-CM | POA: Diagnosis not present

## 2019-01-14 DIAGNOSIS — M2042 Other hammer toe(s) (acquired), left foot: Secondary | ICD-10-CM | POA: Diagnosis not present

## 2019-01-14 DIAGNOSIS — I739 Peripheral vascular disease, unspecified: Secondary | ICD-10-CM | POA: Diagnosis not present

## 2019-02-05 DIAGNOSIS — I10 Essential (primary) hypertension: Secondary | ICD-10-CM | POA: Diagnosis not present

## 2019-02-05 DIAGNOSIS — I119 Hypertensive heart disease without heart failure: Secondary | ICD-10-CM | POA: Diagnosis not present

## 2019-02-05 DIAGNOSIS — N189 Chronic kidney disease, unspecified: Secondary | ICD-10-CM | POA: Diagnosis not present

## 2019-02-05 DIAGNOSIS — K219 Gastro-esophageal reflux disease without esophagitis: Secondary | ICD-10-CM | POA: Diagnosis not present

## 2019-02-05 DIAGNOSIS — D509 Iron deficiency anemia, unspecified: Secondary | ICD-10-CM | POA: Diagnosis not present

## 2019-02-05 DIAGNOSIS — E782 Mixed hyperlipidemia: Secondary | ICD-10-CM | POA: Diagnosis not present

## 2019-02-05 DIAGNOSIS — R69 Illness, unspecified: Secondary | ICD-10-CM | POA: Diagnosis not present

## 2019-02-05 DIAGNOSIS — R001 Bradycardia, unspecified: Secondary | ICD-10-CM | POA: Diagnosis not present

## 2019-02-05 DIAGNOSIS — R7301 Impaired fasting glucose: Secondary | ICD-10-CM | POA: Diagnosis not present

## 2019-02-05 DIAGNOSIS — N182 Chronic kidney disease, stage 2 (mild): Secondary | ICD-10-CM | POA: Diagnosis not present

## 2019-02-06 DIAGNOSIS — R7301 Impaired fasting glucose: Secondary | ICD-10-CM | POA: Diagnosis not present

## 2019-02-06 DIAGNOSIS — E782 Mixed hyperlipidemia: Secondary | ICD-10-CM | POA: Diagnosis not present

## 2019-02-06 DIAGNOSIS — D509 Iron deficiency anemia, unspecified: Secondary | ICD-10-CM | POA: Diagnosis not present

## 2019-02-06 DIAGNOSIS — I10 Essential (primary) hypertension: Secondary | ICD-10-CM | POA: Diagnosis not present

## 2019-02-06 DIAGNOSIS — N182 Chronic kidney disease, stage 2 (mild): Secondary | ICD-10-CM | POA: Diagnosis not present

## 2019-02-11 DIAGNOSIS — M1990 Unspecified osteoarthritis, unspecified site: Secondary | ICD-10-CM | POA: Diagnosis not present

## 2019-02-11 DIAGNOSIS — K219 Gastro-esophageal reflux disease without esophagitis: Secondary | ICD-10-CM | POA: Diagnosis not present

## 2019-02-11 DIAGNOSIS — I129 Hypertensive chronic kidney disease with stage 1 through stage 4 chronic kidney disease, or unspecified chronic kidney disease: Secondary | ICD-10-CM | POA: Diagnosis not present

## 2019-02-11 DIAGNOSIS — D509 Iron deficiency anemia, unspecified: Secondary | ICD-10-CM | POA: Diagnosis not present

## 2019-02-11 DIAGNOSIS — E782 Mixed hyperlipidemia: Secondary | ICD-10-CM | POA: Diagnosis not present

## 2019-02-11 DIAGNOSIS — N183 Chronic kidney disease, stage 3 (moderate): Secondary | ICD-10-CM | POA: Diagnosis not present

## 2019-02-11 DIAGNOSIS — R7301 Impaired fasting glucose: Secondary | ICD-10-CM | POA: Diagnosis not present

## 2019-02-11 DIAGNOSIS — Z7409 Other reduced mobility: Secondary | ICD-10-CM | POA: Diagnosis not present

## 2019-02-11 DIAGNOSIS — J309 Allergic rhinitis, unspecified: Secondary | ICD-10-CM | POA: Diagnosis not present

## 2019-02-11 DIAGNOSIS — R011 Cardiac murmur, unspecified: Secondary | ICD-10-CM | POA: Diagnosis not present

## 2019-02-15 ENCOUNTER — Encounter (HOSPITAL_COMMUNITY): Payer: Self-pay

## 2019-02-15 ENCOUNTER — Other Ambulatory Visit (HOSPITAL_COMMUNITY): Payer: Self-pay | Admitting: Internal Medicine

## 2019-02-15 ENCOUNTER — Ambulatory Visit (HOSPITAL_COMMUNITY): Admission: RE | Admit: 2019-02-15 | Payer: Medicare Other | Source: Ambulatory Visit

## 2019-02-15 DIAGNOSIS — R011 Cardiac murmur, unspecified: Secondary | ICD-10-CM

## 2019-03-28 DIAGNOSIS — M19032 Primary osteoarthritis, left wrist: Secondary | ICD-10-CM | POA: Diagnosis not present

## 2019-03-28 DIAGNOSIS — M62542 Muscle wasting and atrophy, not elsewhere classified, left hand: Secondary | ICD-10-CM | POA: Diagnosis not present

## 2019-04-08 DIAGNOSIS — Z0000 Encounter for general adult medical examination without abnormal findings: Secondary | ICD-10-CM | POA: Diagnosis not present

## 2019-04-27 DIAGNOSIS — M62542 Muscle wasting and atrophy, not elsewhere classified, left hand: Secondary | ICD-10-CM | POA: Diagnosis not present

## 2019-05-28 DIAGNOSIS — M62542 Muscle wasting and atrophy, not elsewhere classified, left hand: Secondary | ICD-10-CM | POA: Diagnosis not present

## 2019-06-10 DIAGNOSIS — R7301 Impaired fasting glucose: Secondary | ICD-10-CM | POA: Diagnosis not present

## 2019-06-10 DIAGNOSIS — I10 Essential (primary) hypertension: Secondary | ICD-10-CM | POA: Diagnosis not present

## 2019-06-10 DIAGNOSIS — I119 Hypertensive heart disease without heart failure: Secondary | ICD-10-CM | POA: Diagnosis not present

## 2019-06-10 DIAGNOSIS — E782 Mixed hyperlipidemia: Secondary | ICD-10-CM | POA: Diagnosis not present

## 2019-06-10 DIAGNOSIS — K219 Gastro-esophageal reflux disease without esophagitis: Secondary | ICD-10-CM | POA: Diagnosis not present

## 2019-06-10 DIAGNOSIS — D509 Iron deficiency anemia, unspecified: Secondary | ICD-10-CM | POA: Diagnosis not present

## 2019-06-10 DIAGNOSIS — R69 Illness, unspecified: Secondary | ICD-10-CM | POA: Diagnosis not present

## 2019-06-10 DIAGNOSIS — R001 Bradycardia, unspecified: Secondary | ICD-10-CM | POA: Diagnosis not present

## 2019-06-10 DIAGNOSIS — N182 Chronic kidney disease, stage 2 (mild): Secondary | ICD-10-CM | POA: Diagnosis not present

## 2019-06-10 DIAGNOSIS — N189 Chronic kidney disease, unspecified: Secondary | ICD-10-CM | POA: Diagnosis not present

## 2019-06-17 DIAGNOSIS — N183 Chronic kidney disease, stage 3 (moderate): Secondary | ICD-10-CM | POA: Diagnosis not present

## 2019-06-17 DIAGNOSIS — R69 Illness, unspecified: Secondary | ICD-10-CM | POA: Diagnosis not present

## 2019-06-17 DIAGNOSIS — D509 Iron deficiency anemia, unspecified: Secondary | ICD-10-CM | POA: Diagnosis not present

## 2019-06-17 DIAGNOSIS — R011 Cardiac murmur, unspecified: Secondary | ICD-10-CM | POA: Diagnosis not present

## 2019-06-17 DIAGNOSIS — R7301 Impaired fasting glucose: Secondary | ICD-10-CM | POA: Diagnosis not present

## 2019-06-17 DIAGNOSIS — I129 Hypertensive chronic kidney disease with stage 1 through stage 4 chronic kidney disease, or unspecified chronic kidney disease: Secondary | ICD-10-CM | POA: Diagnosis not present

## 2019-06-17 DIAGNOSIS — K219 Gastro-esophageal reflux disease without esophagitis: Secondary | ICD-10-CM | POA: Diagnosis not present

## 2019-06-17 DIAGNOSIS — R7303 Prediabetes: Secondary | ICD-10-CM | POA: Diagnosis not present

## 2019-06-17 DIAGNOSIS — E782 Mixed hyperlipidemia: Secondary | ICD-10-CM | POA: Diagnosis not present

## 2019-06-17 DIAGNOSIS — M1990 Unspecified osteoarthritis, unspecified site: Secondary | ICD-10-CM | POA: Diagnosis not present

## 2019-06-25 ENCOUNTER — Emergency Department (HOSPITAL_COMMUNITY)
Admission: EM | Admit: 2019-06-25 | Discharge: 2019-06-25 | Disposition: A | Discharge status: Home / Self Care | Payer: Medicare HMO | Attending: Emergency Medicine | Admitting: Emergency Medicine

## 2019-06-25 ENCOUNTER — Emergency Department (HOSPITAL_COMMUNITY): Payer: Medicare HMO

## 2019-06-25 ENCOUNTER — Encounter (HOSPITAL_COMMUNITY): Payer: Self-pay | Admitting: Emergency Medicine

## 2019-06-25 ENCOUNTER — Other Ambulatory Visit: Payer: Self-pay

## 2019-06-25 DIAGNOSIS — Z7982 Long term (current) use of aspirin: Secondary | ICD-10-CM | POA: Insufficient documentation

## 2019-06-25 DIAGNOSIS — R1010 Upper abdominal pain, unspecified: Secondary | ICD-10-CM | POA: Insufficient documentation

## 2019-06-25 DIAGNOSIS — K449 Diaphragmatic hernia without obstruction or gangrene: Secondary | ICD-10-CM | POA: Diagnosis not present

## 2019-06-25 DIAGNOSIS — R350 Frequency of micturition: Secondary | ICD-10-CM | POA: Insufficient documentation

## 2019-06-25 DIAGNOSIS — E860 Dehydration: Secondary | ICD-10-CM

## 2019-06-25 DIAGNOSIS — Z96659 Presence of unspecified artificial knee joint: Secondary | ICD-10-CM | POA: Insufficient documentation

## 2019-06-25 DIAGNOSIS — R531 Weakness: Secondary | ICD-10-CM | POA: Insufficient documentation

## 2019-06-25 DIAGNOSIS — I10 Essential (primary) hypertension: Secondary | ICD-10-CM | POA: Insufficient documentation

## 2019-06-25 DIAGNOSIS — N179 Acute kidney failure, unspecified: Secondary | ICD-10-CM | POA: Diagnosis not present

## 2019-06-25 DIAGNOSIS — R112 Nausea with vomiting, unspecified: Secondary | ICD-10-CM | POA: Diagnosis present

## 2019-06-25 DIAGNOSIS — Z79899 Other long term (current) drug therapy: Secondary | ICD-10-CM | POA: Insufficient documentation

## 2019-06-25 DIAGNOSIS — N3000 Acute cystitis without hematuria: Secondary | ICD-10-CM | POA: Insufficient documentation

## 2019-06-25 DIAGNOSIS — R1111 Vomiting without nausea: Secondary | ICD-10-CM | POA: Diagnosis not present

## 2019-06-25 LAB — URINALYSIS, ROUTINE W REFLEX MICROSCOPIC
Bilirubin Urine: NEGATIVE
Glucose, UA: NEGATIVE mg/dL
Ketones, ur: NEGATIVE mg/dL
Nitrite: NEGATIVE
Protein, ur: 30 mg/dL — AB
Specific Gravity, Urine: 1.015 (ref 1.005–1.030)
WBC, UA: >50 WBC/hpf — ABNORMAL HIGH (ref 0–5)
pH: 7 (ref 5.0–8.0)

## 2019-06-25 LAB — COMPREHENSIVE METABOLIC PANEL
ALT: 22 U/L (ref 0–44)
AST: 39 U/L (ref 15–41)
Albumin: 3.7 g/dL (ref 3.5–5.0)
Alkaline Phosphatase: 88 U/L (ref 38–126)
Anion gap: 16 — ABNORMAL HIGH (ref 5–15)
BUN: 42 mg/dL — ABNORMAL HIGH (ref 8–23)
CO2: 23 mmol/L (ref 22–32)
Calcium: 9.1 mg/dL (ref 8.9–10.3)
Chloride: 100 mmol/L (ref 98–111)
Creatinine, Ser: 1.66 mg/dL — ABNORMAL HIGH (ref 0.44–1.00)
GFR calc Af Amer: 33 mL/min — ABNORMAL LOW (ref >60)
GFR calc non Af Amer: 28 mL/min — ABNORMAL LOW (ref >60)
Glucose, Bld: 103 mg/dL — ABNORMAL HIGH (ref 70–99)
Potassium: 4 mmol/L (ref 3.5–5.1)
Sodium: 139 mmol/L (ref 135–145)
Total Bilirubin: 1.3 mg/dL — ABNORMAL HIGH (ref 0.3–1.2)
Total Protein: 7.8 g/dL (ref 6.5–8.1)

## 2019-06-25 LAB — CBC WITH DIFFERENTIAL/PLATELET
Abs Immature Granulocytes: 0.06 10*3/uL (ref 0.00–0.07)
Basophils Absolute: 0 10*3/uL (ref 0.0–0.1)
Basophils Relative: 0 %
Eosinophils Absolute: 0 10*3/uL (ref 0.0–0.5)
Eosinophils Relative: 0 %
HCT: 45.3 % (ref 36.0–46.0)
Hemoglobin: 14.6 g/dL (ref 12.0–15.0)
Immature Granulocytes: 1 %
Lymphocytes Relative: 14 %
Lymphs Abs: 1.6 10*3/uL (ref 0.7–4.0)
MCH: 31.4 pg (ref 26.0–34.0)
MCHC: 32.2 g/dL (ref 30.0–36.0)
MCV: 97.4 fL (ref 80.0–100.0)
Monocytes Absolute: 1 10*3/uL (ref 0.1–1.0)
Monocytes Relative: 9 %
Neutro Abs: 8.7 10*3/uL — ABNORMAL HIGH (ref 1.7–7.7)
Neutrophils Relative %: 76 %
Platelets: 323 10*3/uL (ref 150–400)
RBC: 4.65 MIL/uL (ref 3.87–5.11)
RDW: 13.6 % (ref 11.5–15.5)
WBC: 11.4 10*3/uL — ABNORMAL HIGH (ref 4.0–10.5)
nRBC: 0 % (ref 0.0–0.2)

## 2019-06-25 LAB — LIPASE, BLOOD: Lipase: 25 U/L (ref 11–51)

## 2019-06-25 MED ORDER — SODIUM CHLORIDE 0.9 % IV SOLN
1.0000 g | Freq: Once | INTRAVENOUS | Status: AC
  Administered 2019-06-25: 1 g via INTRAVENOUS
  Filled 2019-06-25: qty 10

## 2019-06-25 MED ORDER — SODIUM CHLORIDE 0.9 % IV BOLUS
500.0000 mL | Freq: Once | INTRAVENOUS | Status: AC
  Administered 2019-06-25: 500 mL via INTRAVENOUS

## 2019-06-25 MED ORDER — SODIUM CHLORIDE 0.9 % IV BOLUS
1000.0000 mL | Freq: Once | INTRAVENOUS | Status: AC
  Administered 2019-06-25: 1000 mL via INTRAVENOUS

## 2019-06-25 MED ORDER — SODIUM CHLORIDE 0.9 % IV SOLN: 1.0000 g | Freq: Once | INTRAVENOUS | Status: DC

## 2019-06-25 MED ORDER — ONDANSETRON 4 MG PO TBDP: 4.0000 mg | ORAL_TABLET | Freq: Three times a day (TID) | ORAL | 0 refills | Status: DC | PRN

## 2019-06-25 MED ORDER — CEPHALEXIN 250 MG PO CAPS: 250.0000 mg | ORAL_CAPSULE | Freq: Four times a day (QID) | ORAL | 0 refills | Status: AC

## 2019-06-25 NOTE — ED Provider Notes (Signed)
[facility] EMERGENCY DEPARTMENT Provider Note   CSN: [id number] Arrival date & time: 06/25/19  1121    History   Chief Complaint Chief Complaint  Patient presents with   Abdominal Pain    HPI Dana Ellis is a 85 y.o. female presenting for evaluation of n/v and abd pain.  Pt states 4 days ago she started to have n/v. Since then she has developed intermittent upper abd pain. Pain is associated with emesis. She states she is having 3-4 episodes of emesis a day. It is nonbloody, non bilious. She states she is feeling weaker today than normal. She denies fevers, chills, cough, cp, sob, lower abd pain, or abnormal BMs. She reports frequent urination with mild discomfort for the past 2 days. She denies hematuria. She denies sick contacts. No recent medication changes. She denies h/o abd surgeries. Last BM last night.   Additional history obtained from chart review. H/o pancreatitis, htn, gerd.     HPI  Past Medical History:  Diagnosis Date   Arthritis    Hypertension     Patient Active Problem List   Diagnosis Date Noted   Olecranon bursitis of left elbow 03/26/2013   Left elbow pain 03/26/2013   BENIGN POSITIONAL VERTIGO 07/13/2009   PERIPHERAL EDEMA 02/24/2009   GERD 09/17/2007   GOITER, MULTINODULAR 03/06/2007   OBESITY NOS 11/29/2006   ANXIETY 11/29/2006   DEPRESSION 11/29/2006   HYPERTENSION 11/29/2006   ALLERGIC RHINITIS 11/29/2006   OSTEOARTHRITIS 11/29/2006   DEGENERATION, DISC NOS 11/29/2006    Past Surgical History:  Procedure Laterality Date   ABDOMINAL HYSTERECTOMY     JOINT REPLACEMENT     TOTAL KNEE ARTHROPLASTY       OB History   No obstetric history on file.      Home Medications    Prior to Admission medications   Medication Sig Start Date End Date Taking? Authorizing Provider  amLODipine (NORVASC) 2.5 MG tablet Take 2.5 mg by mouth daily.   Yes [provider]  aspirin EC 81 MG tablet Take 81 mg by mouth  daily.    Yes [provider]  calcium-vitamin D (OSCAL WITH D) 500-200 MG-UNIT per tablet Take 1 tablet by mouth 2 (two) times daily.   Yes [provider]  cetirizine (ZYRTEC) 10 MG tablet Take 10 mg by mouth daily.    Yes [provider]  citalopram (CELEXA) 20 MG tablet Take 20 mg by mouth daily.    Yes [provider]  cloNIDine (CATAPRES) 0.3 MG tablet Take 0.3 mg by mouth 2 (two) times daily. 06/06/19  Yes [provider]  ferrous sulfate 325 (65 FE) MG tablet Take 325 mg by mouth daily with breakfast.   Yes [provider]  fluticasone (FLONASE) 50 MCG/ACT nasal spray Place 2 sprays into both nostrils daily. 06/03/19  Yes [provider]  furosemide (LASIX) 40 MG tablet Take 40 mg by mouth daily. *Take one tablet 30 minutes after taking Metolazone*   Yes [provider]  metolazone (ZAROXOLYN) 2.5 MG tablet Take 2.5 mg by mouth daily. *Take one tablet by mouth daily 30 minutes prior to taking LASIX (Furosemide)   Yes [provider]  metoprolol succinate (TOPROL-XL) 50 MG 24 hr tablet Take 50 mg by mouth daily. Take with or immediately following a meal.   Yes [provider]  Multiple Vitamin (MULTIVITAMIN WITH MINERALS) TABS Take 1 tablet by mouth daily.   Yes [provider]  olmesartan (BENICAR) 40 MG  tablet Take 40 mg by mouth daily.    Yes [provider]  omeprazole (PRILOSEC) 20 MG capsule Take 1 capsule by mouth daily. 06/13/19  Yes [provider]  potassium chloride SA (K-DUR,KLOR-CON) 20 MEQ tablet Take 20-40 mEq by mouth every morning. *Alternate taking one tablet daily and taking two tablets daily*   Yes [provider]  pravastatin (PRAVACHOL) 40 MG tablet Take 40 mg by mouth daily.    Yes [provider]  acetaminophen (TYLENOL) 500 MG tablet Take 1,000 mg by mouth 2 (two) times daily. For pain     [provider]  cephALEXin (KEFLEX) 250  MG capsule Take 1 capsule (250 mg total) by mouth 4 (four) times daily for 7 days. 06/25/19 07/02/19  Anurag Scarfo, PA-C  meclizine (ANTIVERT) 12.5 MG tablet Take 12.5 mg by mouth daily as needed.     [provider]  meloxicam (MOBIC) 7.5 MG tablet Take 7.5 mg by mouth 2 (two) times daily. 01/12/17   [provider]  ondansetron (ZOFRAN ODT) 4 MG disintegrating tablet Take 1 tablet (4 mg total) by mouth every 8 (eight) hours as needed for nausea or vomiting. 06/25/19   Lona Six, PA-C    Family History No family history on file.  Social History Social History   Tobacco Use   Smoking status: Never Smoker   Smokeless tobacco: Never Used  Substance Use Topics   Alcohol use: No   Drug use: No     Allergies   Ace inhibitors   Review of Systems Review of Systems  Gastrointestinal: Positive for abdominal pain, nausea and vomiting.  Neurological: Positive for weakness.  All other systems reviewed and are negative.    Physical Exam Updated Vital Signs BP 135/81    Pulse 91    Temp 98.4 F (36.9 C) (Oral)    Resp 17    Ht 6' (1.829 m)    Wt 78 kg    SpO2 96%    BMI 23.32 kg/m   Physical Exam Vitals signs and nursing note reviewed.  Constitutional:      General: She is not in acute distress.    Appearance: She is well-developed.     Comments: Appears nontoxic  HENT:     Head: Normocephalic and atraumatic.  Eyes:     Conjunctiva/sclera: Conjunctivae normal.     Pupils: Pupils are equal, round, and reactive to light.  Neck:     Musculoskeletal: Normal range of motion and neck supple.  Cardiovascular:     Rate and Rhythm: Normal rate and regular rhythm.     Pulses: Normal pulses.  Pulmonary:     Effort: Pulmonary effort is normal. No respiratory distress.     Breath sounds: Normal breath sounds. No wheezing.  Abdominal:     General: There is no distension.     Palpations: Abdomen is soft.     Tenderness: There is no abdominal tenderness.      Comments: No ttp of the abd. Soft without rigidity, guarding, distention. Negative rebound.   Musculoskeletal: Normal range of motion.  Skin:    General: Skin is warm and dry.     Capillary Refill: Capillary refill takes less than 2 seconds.  Neurological:     Mental Status: She is alert and oriented to person, place, and time.      ED Treatments / Results  Labs (all labs ordered are listed, but only abnormal results are displayed) Labs Reviewed  URINALYSIS, [place]  MICROSCOPIC - Abnormal; Notable for the following components:      Result Value   Color, Urine AMBER (*)    APPearance CLOUDY (*)    Hgb urine dipstick SMALL (*)    Protein, ur 30 (*)    Leukocytes,Ua LARGE (*)    WBC, UA >50 (*)    Bacteria, UA MANY (*)    All other components within normal limits  CBC WITH DIFFERENTIAL/PLATELET - Abnormal; Notable for the following components:   WBC 11.4 (*)    Neutro Abs 8.7 (*)    All other components within normal limits  COMPREHENSIVE METABOLIC PANEL - Abnormal; Notable for the following components:   Glucose, Bld 103 (*)    BUN 42 (*)    Creatinine, Ser 1.66 (*)    Total Bilirubin 1.3 (*)    GFR calc non Af Amer 28 (*)    GFR calc Af Amer 33 (*)    Anion gap 16 (*)    All other components within normal limits  URINE CULTURE  LIPASE, BLOOD    EKG None  Radiology Ct Abdomen Pelvis Wo Contrast  Result Date: 06/25/2019 CLINICAL DATA:  Nausea, vomiting and abdominal pain. Concern for acute pancreatitis. EXAM: CT ABDOMEN AND PELVIS WITHOUT CONTRAST TECHNIQUE: Multidetector CT imaging of the abdomen and pelvis was performed following the standard protocol without IV contrast. COMPARISON:  02/04/2017 FINDINGS: Lower chest: Large hiatal hernia with adjacent pulmonary atelectasis. No evidence of pneumonia or effusion. Hepatobiliary: The liver is diminutive. No focal lesion. No calcified gallstones. Pancreas: Pancreas appears negative by CT. No evidence inflammation.  Single punctate parenchymal calcification in the body. Spleen: Normal Adrenals/Urinary Tract: Adrenal glands are normal. Kidneys are normal. Air-fluid level in the bladder, presumably subsequent to catheterization. If not, this would indicate either a fistula or gas-forming infection. Stomach/Bowel: No acute abdominal finding. No sign of obstruction or focal bowel lesion. Vascular/Lymphatic: Aortic atherosclerosis. No aneurysm. IVC is normal. No retroperitoneal adenopathy. Reproductive: Previous hysterectomy.  No pelvic mass. Other: No free fluid or air. Musculoskeletal: Curvature in chronic degenerative changes of the spine. Osteoarthritis of the hips. IMPRESSION: No sign of acute pancreatitis by CT. Large hiatal hernia. Aortic atherosclerosis. 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 Air/gas within the bladder. This is presumed to be subsequent to previous catheterization. If not, this could indicate a fistula or gas-forming infection. Electronically Signed   By: Nelson Chimes M.D.   On: 06/25/2019 16:44    Procedures Procedures (including critical care time)  Medications Ordered in ED Medications  sodium chloride 0.9 % bolus 500 mL (0 mLs Intravenous Stopped 06/25/19 1455)  sodium chloride 0.9 % bolus 500 mL (0 mLs Intravenous Stopped 06/25/19 1600)  sodium chloride 0.9 % bolus 1,000 mL (0 mLs Intravenous Stopped 06/25/19 1935)  cefTRIAXone (ROCEPHIN) 1 g in sodium chloride 0.9 % 100 mL IVPB (0 g Intravenous Stopped 06/25/19 2017)     Initial Impression / Assessment and Plan / ED Course  I have reviewed the triage vital signs and the nursing notes.  Pertinent labs & imaging results that were available during my care of the patient were reviewed by me and considered in my medical decision making (see chart for details).        Presented for evaluation nausea, vomiting, abdominal pain, urinary symptoms.  Physical exam shows patient who is nontoxic.  No obvious abdominal tenderness.  However,  considering her age, vomiting, and weakness, concern for dehydration.  History of pancreatitis.  Concerned about her urinary  symptoms.  As such, will order labs and CT abdomen pelvis for further evaluation.  Urine ordered.  Labs show slight leukocytosis at 11.4.  Creatinine elevated from baseline at 1.6.  BUN almost doubled from baseline, and current GFR however from baseline.  As such, patient with an AKI, likely due to dehydration.  Orthostatics consistent with dehydration as her blood pressure dropped from 751 systolic to 90 systolic.  2 L of fluid ordered.  Lipase negative.  CT pending.  CT abdomen pelvis negative except for air/gas in the bladder, as she has not had a recent foley, concern for gas-producing bacteria.  Urine positive for infection. On eeassessment, patient reports she is feeling better.  She appears very well at this time.  Discussed with attending, Dr. Estill Cotta evaluated the patient.  Will consult with urology considering age, UTI with concurrent AKI, and concern for vasculitis and bacteria.  Discussed with Dr. Tresa Moore from urology who reviewed the films and labs.  Recommended dose of Rocephin here and Keflex outpatient.  Discussed strict return precautions including signs of sepsis.  Discussed findings and plan with patient, who is agreeable.  At this time, patient appears safe for discharge.  Return precautions given.  Patient states she understands and agrees to plan.  Final Clinical Impressions(s) / ED Diagnoses   Final diagnoses:  Acute cystitis without hematuria  Dehydration  AKI (acute kidney injury) [facility])    ED Discharge Orders         Ordered    cephALEXin (KEFLEX) 250 MG capsule  4 times daily     06/25/19 1923    ondansetron (ZOFRAN ODT) 4 MG disintegrating tablet  Every 8 hours PRN     06/25/19 1924           Franchot Heidelberg, PA-C 06/25/19 2300    Nat Christen, MD 06/26/19 1705

## 2019-06-25 NOTE — ED Triage Notes (Signed)
Abd pain in middle of abdomen, nausea and vomiting since Sunday.  Denies urinary s/s, no issues with bowel movements.

## 2019-06-25 NOTE — ED Notes (Signed)
Pt attempted to give urine sample but unable to at this time.

## 2019-06-25 NOTE — Discharge Instructions (Signed)
Take antibiotics as prescribed.  Take the entire course, even if your symptoms improve. It is very important that you stay well-hydrated water.  Your urine should be clear to pale yellow. Use Zofran as needed for nausea or vomiting. Return to the emergency room immediately if you develop high fevers, severe worsening pain, persistent vomiting despite medication, or any new, worsening, or concerning symptoms.

## 2019-06-27 DIAGNOSIS — M62542 Muscle wasting and atrophy, not elsewhere classified, left hand: Secondary | ICD-10-CM | POA: Diagnosis not present

## 2019-06-28 LAB — URINE CULTURE: Culture: >=100000 — AB

## 2019-06-29 ENCOUNTER — Telehealth: Payer: Self-pay | Admitting: Emergency Medicine

## 2019-06-29 NOTE — Telephone Encounter (Signed)
Post ED Visit - Positive Culture Follow-up  Culture report reviewed by antimicrobial stewardship pharmacist: [place] Team []  Elenor Quinones, Pharm.D. []  Heide Guile, Pharm.D., BCPS AQ-ID []  Parks Neptune, Pharm.D., BCPS []  Alycia Rossetti, Pharm.D., BCPS []  [place], [place].D., BCPS, AAHIVP []  Legrand Como, Pharm.D., BCPS, AAHIVP []  Salome Arnt, PharmD, BCPS []  Johnnette Gourd, PharmD, BCPS [x]  Hughes Better, PharmD, BCPS []  Leeroy Cha, PharmD []  Laqueta Linden, PharmD, BCPS []  Albertina Parr, PharmD  [place] Team []  Leodis Sias, PharmD []  Lindell Spar, PharmD []  Royetta Asal, PharmD []  Graylin Shiver, Rph []  Rema Fendt) Glennon Mac, PharmD []  Arlyn Dunning, PharmD []  Netta Cedars, PharmD []  Dia Sitter, PharmD []  Leone Haven, PharmD []  Gretta Arab, PharmD []  Theodis Shove, PharmD []  Peggyann Juba, PharmD []  Reuel Boom, PharmD   Positive urine culture Treated with Cephalexin, organism sensitive to the same and no further patient follow-up is required at this time.  Sandi Raveling Tam [place]isle 06/29/2019, 2:48 PM

## 2019-07-02 DIAGNOSIS — N390 Urinary tract infection, site not specified: Secondary | ICD-10-CM | POA: Diagnosis not present

## 2019-07-02 DIAGNOSIS — N183 Chronic kidney disease, stage 3 (moderate): Secondary | ICD-10-CM | POA: Diagnosis not present

## 2019-07-02 DIAGNOSIS — I10 Essential (primary) hypertension: Secondary | ICD-10-CM | POA: Diagnosis not present

## 2019-07-02 DIAGNOSIS — R531 Weakness: Secondary | ICD-10-CM | POA: Diagnosis not present

## 2019-07-02 DIAGNOSIS — R351 Nocturia: Secondary | ICD-10-CM | POA: Diagnosis not present

## 2019-07-02 DIAGNOSIS — Z6828 Body mass index (BMI) 28.0-28.9, adult: Secondary | ICD-10-CM | POA: Diagnosis not present

## 2019-07-02 DIAGNOSIS — Z6831 Body mass index (BMI) 31.0-31.9, adult: Secondary | ICD-10-CM | POA: Diagnosis not present

## 2019-07-02 DIAGNOSIS — I129 Hypertensive chronic kidney disease with stage 1 through stage 4 chronic kidney disease, or unspecified chronic kidney disease: Secondary | ICD-10-CM | POA: Diagnosis not present

## 2019-07-02 DIAGNOSIS — Z7409 Other reduced mobility: Secondary | ICD-10-CM | POA: Diagnosis not present

## 2019-07-02 DIAGNOSIS — R7303 Prediabetes: Secondary | ICD-10-CM | POA: Diagnosis not present

## 2019-07-02 DIAGNOSIS — N182 Chronic kidney disease, stage 2 (mild): Secondary | ICD-10-CM | POA: Diagnosis not present

## 2019-07-02 DIAGNOSIS — Z0000 Encounter for general adult medical examination without abnormal findings: Secondary | ICD-10-CM | POA: Diagnosis not present

## 2019-07-02 DIAGNOSIS — N179 Acute kidney failure, unspecified: Secondary | ICD-10-CM | POA: Diagnosis not present

## 2019-07-02 DIAGNOSIS — R001 Bradycardia, unspecified: Secondary | ICD-10-CM | POA: Diagnosis not present

## 2019-07-02 DIAGNOSIS — N189 Chronic kidney disease, unspecified: Secondary | ICD-10-CM | POA: Diagnosis not present

## 2019-07-02 DIAGNOSIS — R011 Cardiac murmur, unspecified: Secondary | ICD-10-CM | POA: Diagnosis not present

## 2019-07-02 DIAGNOSIS — M1990 Unspecified osteoarthritis, unspecified site: Secondary | ICD-10-CM | POA: Diagnosis not present

## 2019-08-27 DIAGNOSIS — R69 Illness, unspecified: Secondary | ICD-10-CM | POA: Diagnosis not present

## 2019-08-27 DIAGNOSIS — E782 Mixed hyperlipidemia: Secondary | ICD-10-CM | POA: Diagnosis not present

## 2019-08-27 DIAGNOSIS — R7301 Impaired fasting glucose: Secondary | ICD-10-CM | POA: Diagnosis not present

## 2019-08-27 DIAGNOSIS — I10 Essential (primary) hypertension: Secondary | ICD-10-CM | POA: Diagnosis not present

## 2019-08-27 DIAGNOSIS — I119 Hypertensive heart disease without heart failure: Secondary | ICD-10-CM | POA: Diagnosis not present

## 2019-08-27 DIAGNOSIS — K219 Gastro-esophageal reflux disease without esophagitis: Secondary | ICD-10-CM | POA: Diagnosis not present

## 2019-08-27 DIAGNOSIS — N182 Chronic kidney disease, stage 2 (mild): Secondary | ICD-10-CM | POA: Diagnosis not present

## 2019-08-27 DIAGNOSIS — R001 Bradycardia, unspecified: Secondary | ICD-10-CM | POA: Diagnosis not present

## 2019-08-27 DIAGNOSIS — N189 Chronic kidney disease, unspecified: Secondary | ICD-10-CM | POA: Diagnosis not present

## 2019-08-27 DIAGNOSIS — D509 Iron deficiency anemia, unspecified: Secondary | ICD-10-CM | POA: Diagnosis not present

## 2019-09-20 DIAGNOSIS — R69 Illness, unspecified: Secondary | ICD-10-CM | POA: Diagnosis not present

## 2019-09-24 DIAGNOSIS — M2041 Other hammer toe(s) (acquired), right foot: Secondary | ICD-10-CM | POA: Diagnosis not present

## 2019-09-24 DIAGNOSIS — I739 Peripheral vascular disease, unspecified: Secondary | ICD-10-CM | POA: Diagnosis not present

## 2019-09-24 DIAGNOSIS — M2042 Other hammer toe(s) (acquired), left foot: Secondary | ICD-10-CM | POA: Diagnosis not present

## 2019-09-24 DIAGNOSIS — M79672 Pain in left foot: Secondary | ICD-10-CM | POA: Diagnosis not present

## 2019-09-24 DIAGNOSIS — M79671 Pain in right foot: Secondary | ICD-10-CM | POA: Diagnosis not present

## 2019-10-23 DIAGNOSIS — D509 Iron deficiency anemia, unspecified: Secondary | ICD-10-CM | POA: Diagnosis not present

## 2019-10-23 DIAGNOSIS — I10 Essential (primary) hypertension: Secondary | ICD-10-CM | POA: Diagnosis not present

## 2019-10-23 DIAGNOSIS — E782 Mixed hyperlipidemia: Secondary | ICD-10-CM | POA: Diagnosis not present

## 2019-10-23 DIAGNOSIS — M159 Polyosteoarthritis, unspecified: Secondary | ICD-10-CM | POA: Diagnosis not present

## 2019-10-23 DIAGNOSIS — R7303 Prediabetes: Secondary | ICD-10-CM | POA: Diagnosis not present

## 2019-10-23 DIAGNOSIS — R7301 Impaired fasting glucose: Secondary | ICD-10-CM | POA: Diagnosis not present

## 2019-10-29 DIAGNOSIS — D509 Iron deficiency anemia, unspecified: Secondary | ICD-10-CM | POA: Diagnosis not present

## 2019-10-29 DIAGNOSIS — K219 Gastro-esophageal reflux disease without esophagitis: Secondary | ICD-10-CM | POA: Diagnosis not present

## 2019-10-29 DIAGNOSIS — R7301 Impaired fasting glucose: Secondary | ICD-10-CM | POA: Diagnosis not present

## 2019-10-29 DIAGNOSIS — Z7409 Other reduced mobility: Secondary | ICD-10-CM | POA: Diagnosis not present

## 2019-10-29 DIAGNOSIS — M1990 Unspecified osteoarthritis, unspecified site: Secondary | ICD-10-CM | POA: Diagnosis not present

## 2019-10-29 DIAGNOSIS — J302 Other seasonal allergic rhinitis: Secondary | ICD-10-CM | POA: Diagnosis not present

## 2019-10-29 DIAGNOSIS — I129 Hypertensive chronic kidney disease with stage 1 through stage 4 chronic kidney disease, or unspecified chronic kidney disease: Secondary | ICD-10-CM | POA: Diagnosis not present

## 2019-10-29 DIAGNOSIS — R011 Cardiac murmur, unspecified: Secondary | ICD-10-CM | POA: Diagnosis not present

## 2019-10-29 DIAGNOSIS — E663 Overweight: Secondary | ICD-10-CM | POA: Diagnosis not present

## 2019-10-29 DIAGNOSIS — Z6829 Body mass index (BMI) 29.0-29.9, adult: Secondary | ICD-10-CM | POA: Diagnosis not present

## 2019-12-02 DIAGNOSIS — E782 Mixed hyperlipidemia: Secondary | ICD-10-CM | POA: Diagnosis not present

## 2019-12-02 DIAGNOSIS — R69 Illness, unspecified: Secondary | ICD-10-CM | POA: Diagnosis not present

## 2019-12-02 DIAGNOSIS — I119 Hypertensive heart disease without heart failure: Secondary | ICD-10-CM | POA: Diagnosis not present

## 2019-12-02 DIAGNOSIS — D509 Iron deficiency anemia, unspecified: Secondary | ICD-10-CM | POA: Diagnosis not present

## 2019-12-02 DIAGNOSIS — N189 Chronic kidney disease, unspecified: Secondary | ICD-10-CM | POA: Diagnosis not present

## 2019-12-02 DIAGNOSIS — R001 Bradycardia, unspecified: Secondary | ICD-10-CM | POA: Diagnosis not present

## 2019-12-02 DIAGNOSIS — K219 Gastro-esophageal reflux disease without esophagitis: Secondary | ICD-10-CM | POA: Diagnosis not present

## 2019-12-02 DIAGNOSIS — I10 Essential (primary) hypertension: Secondary | ICD-10-CM | POA: Diagnosis not present

## 2019-12-02 DIAGNOSIS — R7301 Impaired fasting glucose: Secondary | ICD-10-CM | POA: Diagnosis not present

## 2019-12-12 DIAGNOSIS — M62542 Muscle wasting and atrophy, not elsewhere classified, left hand: Secondary | ICD-10-CM | POA: Diagnosis not present

## 2020-01-14 DIAGNOSIS — E782 Mixed hyperlipidemia: Secondary | ICD-10-CM | POA: Diagnosis not present

## 2020-01-14 DIAGNOSIS — R69 Illness, unspecified: Secondary | ICD-10-CM | POA: Diagnosis not present

## 2020-01-14 DIAGNOSIS — D509 Iron deficiency anemia, unspecified: Secondary | ICD-10-CM | POA: Diagnosis not present

## 2020-01-14 DIAGNOSIS — E7849 Other hyperlipidemia: Secondary | ICD-10-CM | POA: Diagnosis not present

## 2020-01-14 DIAGNOSIS — R001 Bradycardia, unspecified: Secondary | ICD-10-CM | POA: Diagnosis not present

## 2020-01-14 DIAGNOSIS — I129 Hypertensive chronic kidney disease with stage 1 through stage 4 chronic kidney disease, or unspecified chronic kidney disease: Secondary | ICD-10-CM | POA: Diagnosis not present

## 2020-01-14 DIAGNOSIS — R7301 Impaired fasting glucose: Secondary | ICD-10-CM | POA: Diagnosis not present

## 2020-01-14 DIAGNOSIS — I10 Essential (primary) hypertension: Secondary | ICD-10-CM | POA: Diagnosis not present

## 2020-01-14 DIAGNOSIS — K219 Gastro-esophageal reflux disease without esophagitis: Secondary | ICD-10-CM | POA: Diagnosis not present

## 2020-01-14 DIAGNOSIS — N189 Chronic kidney disease, unspecified: Secondary | ICD-10-CM | POA: Diagnosis not present

## 2020-01-17 DIAGNOSIS — H524 Presbyopia: Secondary | ICD-10-CM | POA: Diagnosis not present

## 2020-01-17 DIAGNOSIS — H25013 Cortical age-related cataract, bilateral: Secondary | ICD-10-CM | POA: Diagnosis not present

## 2020-02-06 DIAGNOSIS — I10 Essential (primary) hypertension: Secondary | ICD-10-CM | POA: Diagnosis not present

## 2020-02-06 DIAGNOSIS — D509 Iron deficiency anemia, unspecified: Secondary | ICD-10-CM | POA: Diagnosis not present

## 2020-02-06 DIAGNOSIS — R7301 Impaired fasting glucose: Secondary | ICD-10-CM | POA: Diagnosis not present

## 2020-02-06 DIAGNOSIS — K219 Gastro-esophageal reflux disease without esophagitis: Secondary | ICD-10-CM | POA: Diagnosis not present

## 2020-02-06 DIAGNOSIS — R001 Bradycardia, unspecified: Secondary | ICD-10-CM | POA: Diagnosis not present

## 2020-02-06 DIAGNOSIS — E782 Mixed hyperlipidemia: Secondary | ICD-10-CM | POA: Diagnosis not present

## 2020-02-06 DIAGNOSIS — R69 Illness, unspecified: Secondary | ICD-10-CM | POA: Diagnosis not present

## 2020-02-06 DIAGNOSIS — N189 Chronic kidney disease, unspecified: Secondary | ICD-10-CM | POA: Diagnosis not present

## 2020-02-06 DIAGNOSIS — I129 Hypertensive chronic kidney disease with stage 1 through stage 4 chronic kidney disease, or unspecified chronic kidney disease: Secondary | ICD-10-CM | POA: Diagnosis not present

## 2020-03-30 DIAGNOSIS — M545 Low back pain: Secondary | ICD-10-CM | POA: Diagnosis not present

## 2020-03-30 DIAGNOSIS — M47816 Spondylosis without myelopathy or radiculopathy, lumbar region: Secondary | ICD-10-CM | POA: Diagnosis not present

## 2020-03-30 DIAGNOSIS — M25511 Pain in right shoulder: Secondary | ICD-10-CM | POA: Diagnosis not present

## 2020-03-30 DIAGNOSIS — M542 Cervicalgia: Secondary | ICD-10-CM | POA: Diagnosis not present

## 2020-04-03 DIAGNOSIS — Z6828 Body mass index (BMI) 28.0-28.9, adult: Secondary | ICD-10-CM | POA: Diagnosis not present

## 2020-04-03 DIAGNOSIS — R69 Illness, unspecified: Secondary | ICD-10-CM | POA: Diagnosis not present

## 2020-04-03 DIAGNOSIS — E782 Mixed hyperlipidemia: Secondary | ICD-10-CM | POA: Diagnosis not present

## 2020-04-03 DIAGNOSIS — J309 Allergic rhinitis, unspecified: Secondary | ICD-10-CM | POA: Diagnosis not present

## 2020-04-03 DIAGNOSIS — Z0000 Encounter for general adult medical examination without abnormal findings: Secondary | ICD-10-CM | POA: Diagnosis not present

## 2020-04-03 DIAGNOSIS — J302 Other seasonal allergic rhinitis: Secondary | ICD-10-CM | POA: Diagnosis not present

## 2020-04-03 DIAGNOSIS — D509 Iron deficiency anemia, unspecified: Secondary | ICD-10-CM | POA: Diagnosis not present

## 2020-04-03 DIAGNOSIS — E663 Overweight: Secondary | ICD-10-CM | POA: Diagnosis not present

## 2020-04-03 DIAGNOSIS — I129 Hypertensive chronic kidney disease with stage 1 through stage 4 chronic kidney disease, or unspecified chronic kidney disease: Secondary | ICD-10-CM | POA: Diagnosis not present

## 2020-04-03 DIAGNOSIS — E7849 Other hyperlipidemia: Secondary | ICD-10-CM | POA: Diagnosis not present

## 2020-04-03 DIAGNOSIS — I119 Hypertensive heart disease without heart failure: Secondary | ICD-10-CM | POA: Diagnosis not present

## 2020-04-03 DIAGNOSIS — I10 Essential (primary) hypertension: Secondary | ICD-10-CM | POA: Diagnosis not present

## 2020-04-03 DIAGNOSIS — Z23 Encounter for immunization: Secondary | ICD-10-CM | POA: Diagnosis not present

## 2020-04-03 DIAGNOSIS — R7303 Prediabetes: Secondary | ICD-10-CM | POA: Diagnosis not present

## 2020-04-03 DIAGNOSIS — R7301 Impaired fasting glucose: Secondary | ICD-10-CM | POA: Diagnosis not present

## 2020-04-03 DIAGNOSIS — Z0001 Encounter for general adult medical examination with abnormal findings: Secondary | ICD-10-CM | POA: Diagnosis not present

## 2020-04-07 DIAGNOSIS — Z0001 Encounter for general adult medical examination with abnormal findings: Secondary | ICD-10-CM | POA: Diagnosis not present

## 2020-04-07 DIAGNOSIS — Z7409 Other reduced mobility: Secondary | ICD-10-CM | POA: Diagnosis not present

## 2020-04-07 DIAGNOSIS — R7301 Impaired fasting glucose: Secondary | ICD-10-CM | POA: Diagnosis not present

## 2020-04-07 DIAGNOSIS — I129 Hypertensive chronic kidney disease with stage 1 through stage 4 chronic kidney disease, or unspecified chronic kidney disease: Secondary | ICD-10-CM | POA: Diagnosis not present

## 2020-04-07 DIAGNOSIS — R69 Illness, unspecified: Secondary | ICD-10-CM | POA: Diagnosis not present

## 2020-04-07 DIAGNOSIS — Z6829 Body mass index (BMI) 29.0-29.9, adult: Secondary | ICD-10-CM | POA: Diagnosis not present

## 2020-04-07 DIAGNOSIS — E782 Mixed hyperlipidemia: Secondary | ICD-10-CM | POA: Diagnosis not present

## 2020-04-07 DIAGNOSIS — N182 Chronic kidney disease, stage 2 (mild): Secondary | ICD-10-CM | POA: Diagnosis not present

## 2020-04-07 DIAGNOSIS — K219 Gastro-esophageal reflux disease without esophagitis: Secondary | ICD-10-CM | POA: Diagnosis not present

## 2020-04-07 DIAGNOSIS — J302 Other seasonal allergic rhinitis: Secondary | ICD-10-CM | POA: Diagnosis not present

## 2020-04-07 DIAGNOSIS — D509 Iron deficiency anemia, unspecified: Secondary | ICD-10-CM | POA: Diagnosis not present

## 2020-04-07 DIAGNOSIS — E663 Overweight: Secondary | ICD-10-CM | POA: Diagnosis not present

## 2020-04-07 DIAGNOSIS — M1990 Unspecified osteoarthritis, unspecified site: Secondary | ICD-10-CM | POA: Diagnosis not present

## 2020-04-07 DIAGNOSIS — N189 Chronic kidney disease, unspecified: Secondary | ICD-10-CM | POA: Diagnosis not present

## 2020-04-07 DIAGNOSIS — I119 Hypertensive heart disease without heart failure: Secondary | ICD-10-CM | POA: Diagnosis not present

## 2020-04-07 DIAGNOSIS — I10 Essential (primary) hypertension: Secondary | ICD-10-CM | POA: Diagnosis not present

## 2020-04-07 DIAGNOSIS — R011 Cardiac murmur, unspecified: Secondary | ICD-10-CM | POA: Diagnosis not present

## 2020-04-07 DIAGNOSIS — R001 Bradycardia, unspecified: Secondary | ICD-10-CM | POA: Diagnosis not present

## 2020-05-07 DIAGNOSIS — M79671 Pain in right foot: Secondary | ICD-10-CM | POA: Diagnosis not present

## 2020-05-07 DIAGNOSIS — M79672 Pain in left foot: Secondary | ICD-10-CM | POA: Diagnosis not present

## 2020-05-07 DIAGNOSIS — M2042 Other hammer toe(s) (acquired), left foot: Secondary | ICD-10-CM | POA: Diagnosis not present

## 2020-05-07 DIAGNOSIS — I739 Peripheral vascular disease, unspecified: Secondary | ICD-10-CM | POA: Diagnosis not present

## 2020-05-07 DIAGNOSIS — M2041 Other hammer toe(s) (acquired), right foot: Secondary | ICD-10-CM | POA: Diagnosis not present

## 2020-07-28 DIAGNOSIS — I10 Essential (primary) hypertension: Secondary | ICD-10-CM | POA: Diagnosis not present

## 2020-07-28 DIAGNOSIS — R69 Illness, unspecified: Secondary | ICD-10-CM | POA: Diagnosis not present

## 2020-07-28 DIAGNOSIS — R7301 Impaired fasting glucose: Secondary | ICD-10-CM | POA: Diagnosis not present

## 2020-07-28 DIAGNOSIS — I129 Hypertensive chronic kidney disease with stage 1 through stage 4 chronic kidney disease, or unspecified chronic kidney disease: Secondary | ICD-10-CM | POA: Diagnosis not present

## 2020-07-28 DIAGNOSIS — K219 Gastro-esophageal reflux disease without esophagitis: Secondary | ICD-10-CM | POA: Diagnosis not present

## 2020-07-28 DIAGNOSIS — R001 Bradycardia, unspecified: Secondary | ICD-10-CM | POA: Diagnosis not present

## 2020-07-28 DIAGNOSIS — N189 Chronic kidney disease, unspecified: Secondary | ICD-10-CM | POA: Diagnosis not present

## 2020-07-28 DIAGNOSIS — D509 Iron deficiency anemia, unspecified: Secondary | ICD-10-CM | POA: Diagnosis not present

## 2020-09-08 DIAGNOSIS — Z6828 Body mass index (BMI) 28.0-28.9, adult: Secondary | ICD-10-CM | POA: Diagnosis not present

## 2020-09-08 DIAGNOSIS — R011 Cardiac murmur, unspecified: Secondary | ICD-10-CM | POA: Diagnosis not present

## 2020-09-08 DIAGNOSIS — E663 Overweight: Secondary | ICD-10-CM | POA: Diagnosis not present

## 2020-09-08 DIAGNOSIS — Z0000 Encounter for general adult medical examination without abnormal findings: Secondary | ICD-10-CM | POA: Diagnosis not present

## 2020-09-08 DIAGNOSIS — Z7409 Other reduced mobility: Secondary | ICD-10-CM | POA: Diagnosis not present

## 2020-09-08 DIAGNOSIS — Z6831 Body mass index (BMI) 31.0-31.9, adult: Secondary | ICD-10-CM | POA: Diagnosis not present

## 2020-09-08 DIAGNOSIS — E559 Vitamin D deficiency, unspecified: Secondary | ICD-10-CM | POA: Diagnosis not present

## 2020-09-08 DIAGNOSIS — N1831 Chronic kidney disease, stage 3a: Secondary | ICD-10-CM | POA: Diagnosis not present

## 2020-09-08 DIAGNOSIS — M1990 Unspecified osteoarthritis, unspecified site: Secondary | ICD-10-CM | POA: Diagnosis not present

## 2020-09-08 DIAGNOSIS — Z6832 Body mass index (BMI) 32.0-32.9, adult: Secondary | ICD-10-CM | POA: Diagnosis not present

## 2020-09-08 DIAGNOSIS — N189 Chronic kidney disease, unspecified: Secondary | ICD-10-CM | POA: Diagnosis not present

## 2020-09-08 DIAGNOSIS — I129 Hypertensive chronic kidney disease with stage 1 through stage 4 chronic kidney disease, or unspecified chronic kidney disease: Secondary | ICD-10-CM | POA: Diagnosis not present

## 2020-09-18 DIAGNOSIS — I129 Hypertensive chronic kidney disease with stage 1 through stage 4 chronic kidney disease, or unspecified chronic kidney disease: Secondary | ICD-10-CM | POA: Diagnosis not present

## 2020-09-18 DIAGNOSIS — Z7409 Other reduced mobility: Secondary | ICD-10-CM | POA: Diagnosis not present

## 2020-09-18 DIAGNOSIS — D509 Iron deficiency anemia, unspecified: Secondary | ICD-10-CM | POA: Diagnosis not present

## 2020-09-18 DIAGNOSIS — R69 Illness, unspecified: Secondary | ICD-10-CM | POA: Diagnosis not present

## 2020-09-18 DIAGNOSIS — K219 Gastro-esophageal reflux disease without esophagitis: Secondary | ICD-10-CM | POA: Diagnosis not present

## 2020-09-18 DIAGNOSIS — R011 Cardiac murmur, unspecified: Secondary | ICD-10-CM | POA: Diagnosis not present

## 2020-09-18 DIAGNOSIS — E782 Mixed hyperlipidemia: Secondary | ICD-10-CM | POA: Diagnosis not present

## 2020-09-18 DIAGNOSIS — Z23 Encounter for immunization: Secondary | ICD-10-CM | POA: Diagnosis not present

## 2020-09-18 DIAGNOSIS — M1990 Unspecified osteoarthritis, unspecified site: Secondary | ICD-10-CM | POA: Diagnosis not present

## 2020-09-18 DIAGNOSIS — J302 Other seasonal allergic rhinitis: Secondary | ICD-10-CM | POA: Diagnosis not present

## 2020-11-20 DIAGNOSIS — R7303 Prediabetes: Secondary | ICD-10-CM | POA: Diagnosis not present

## 2020-11-20 DIAGNOSIS — R531 Weakness: Secondary | ICD-10-CM | POA: Diagnosis not present

## 2020-11-20 DIAGNOSIS — M1990 Unspecified osteoarthritis, unspecified site: Secondary | ICD-10-CM | POA: Diagnosis not present

## 2020-11-20 DIAGNOSIS — Z6832 Body mass index (BMI) 32.0-32.9, adult: Secondary | ICD-10-CM | POA: Diagnosis not present

## 2020-11-20 DIAGNOSIS — I129 Hypertensive chronic kidney disease with stage 1 through stage 4 chronic kidney disease, or unspecified chronic kidney disease: Secondary | ICD-10-CM | POA: Diagnosis not present

## 2020-11-20 DIAGNOSIS — Z7409 Other reduced mobility: Secondary | ICD-10-CM | POA: Diagnosis not present

## 2020-11-20 DIAGNOSIS — N189 Chronic kidney disease, unspecified: Secondary | ICD-10-CM | POA: Diagnosis not present

## 2020-11-20 DIAGNOSIS — Z6828 Body mass index (BMI) 28.0-28.9, adult: Secondary | ICD-10-CM | POA: Diagnosis not present

## 2020-11-20 DIAGNOSIS — E559 Vitamin D deficiency, unspecified: Secondary | ICD-10-CM | POA: Diagnosis not present

## 2020-11-20 DIAGNOSIS — Z6831 Body mass index (BMI) 31.0-31.9, adult: Secondary | ICD-10-CM | POA: Diagnosis not present

## 2020-11-20 DIAGNOSIS — E663 Overweight: Secondary | ICD-10-CM | POA: Diagnosis not present

## 2020-11-20 DIAGNOSIS — Z0000 Encounter for general adult medical examination without abnormal findings: Secondary | ICD-10-CM | POA: Diagnosis not present

## 2020-11-20 DIAGNOSIS — R635 Abnormal weight gain: Secondary | ICD-10-CM | POA: Diagnosis not present

## 2020-11-20 DIAGNOSIS — R011 Cardiac murmur, unspecified: Secondary | ICD-10-CM | POA: Diagnosis not present

## 2020-11-20 DIAGNOSIS — N390 Urinary tract infection, site not specified: Secondary | ICD-10-CM | POA: Diagnosis not present

## 2021-01-19 DIAGNOSIS — E663 Overweight: Secondary | ICD-10-CM | POA: Diagnosis not present

## 2021-01-19 DIAGNOSIS — Z6831 Body mass index (BMI) 31.0-31.9, adult: Secondary | ICD-10-CM | POA: Diagnosis not present

## 2021-01-19 DIAGNOSIS — E559 Vitamin D deficiency, unspecified: Secondary | ICD-10-CM | POA: Diagnosis not present

## 2021-01-19 DIAGNOSIS — Z7409 Other reduced mobility: Secondary | ICD-10-CM | POA: Diagnosis not present

## 2021-01-19 DIAGNOSIS — Z6832 Body mass index (BMI) 32.0-32.9, adult: Secondary | ICD-10-CM | POA: Diagnosis not present

## 2021-01-19 DIAGNOSIS — I129 Hypertensive chronic kidney disease with stage 1 through stage 4 chronic kidney disease, or unspecified chronic kidney disease: Secondary | ICD-10-CM | POA: Diagnosis not present

## 2021-01-19 DIAGNOSIS — R011 Cardiac murmur, unspecified: Secondary | ICD-10-CM | POA: Diagnosis not present

## 2021-01-19 DIAGNOSIS — N189 Chronic kidney disease, unspecified: Secondary | ICD-10-CM | POA: Diagnosis not present

## 2021-01-19 DIAGNOSIS — M1990 Unspecified osteoarthritis, unspecified site: Secondary | ICD-10-CM | POA: Diagnosis not present

## 2021-01-19 DIAGNOSIS — Z6828 Body mass index (BMI) 28.0-28.9, adult: Secondary | ICD-10-CM | POA: Diagnosis not present

## 2021-01-19 DIAGNOSIS — Z0000 Encounter for general adult medical examination without abnormal findings: Secondary | ICD-10-CM | POA: Diagnosis not present

## 2021-01-21 ENCOUNTER — Telehealth: Payer: Self-pay | Admitting: *Deleted

## 2021-01-21 DIAGNOSIS — R0600 Dyspnea, unspecified: Secondary | ICD-10-CM

## 2021-01-21 NOTE — Telephone Encounter (Signed)
-----   Message from Josue Hector, MD sent at 01/21/2021  5:30 PM EST ----- New patient next Friday need records Ok to order echo and CXR for dyspnea now

## 2021-01-21 NOTE — Telephone Encounter (Signed)
Orders placed.

## 2021-01-21 NOTE — Progress Notes (Signed)
CARDIOLOGY CONSULT NOTE       Patient ID: Dana Ellis MRN: [id number] DOB/AGE: [birth year] 85 y.o.  Admit date: (Not on file) Referring Physician: [name] Primary Physician: [name], MD Primary Cardiologist: New Reason for Consultation: Dyspnea/Murmur   Active Problems:   * No active hospital problems. *   HPI:  87 y.o. referred by Dr [name] for dyspnea History of HTN Reviewed his office note from 11/20/20 Patient complaining Of headache, leg pain and dizziness. Has arthritis and scoliosis with poor balance and back pain Weight is up BP labile Some exertional dyspnea note indicates murmur and to order echo Labs including Hct/TSH and Cr normal   Reviewed her echo from 01/27/21  EF 60-65% severe LVH but only grade one diastolic dysfunction  AV sclerosis mild AR trivial pericardial effusion   CXR 01/27/21 NAD   ROS All other systems reviewed and negative except as noted above  Past Medical History:  Diagnosis Date  . Arthritis   . Hypertension     Family History  Problem Relation Age of Onset  . CAD Mother   . Arthritis Mother   . CVA Father   . Hypertension Father   . Arthritis Brother     Social History   Socioeconomic History  . Marital status: Divorced    Spouse name: Not on file  . Number of children: Not on file  . Years of education: Not on file  . Highest education level: Not on file  Occupational History  . Not on file  Tobacco Use  . Smoking status: Never Smoker  . Smokeless tobacco: Never Used  Vaping Use  . Vaping Use: Never used  Substance and Sexual Activity  . Alcohol use: No  . Drug use: No  . Sexual activity: Yes    Birth control/protection: Surgical  Other Topics Concern  . Not on file  Social History Narrative  . Not on file   Social Determinants of Health   Financial Resource Strain: Not on file  Food Insecurity: Not on file  Transportation Needs: Not on file  Physical Activity: Not on file  Stress: Not on file  Social  Connections: Not on file  Intimate Partner Violence: Not on file    Past Surgical History:  Procedure Laterality Date  . ABDOMINAL HYSTERECTOMY    . JOINT REPLACEMENT    . TOTAL KNEE ARTHROPLASTY        Current Outpatient Medications:  .  acetaminophen (TYLENOL) 500 MG tablet, Take 1,000 mg by mouth 2 (two) times daily. For pain, Disp: , Rfl:  .  amLODipine (NORVASC) 2.5 MG tablet, Take 2.5 mg by mouth daily., Disp: , Rfl:  .  aspirin EC 81 MG tablet, Take 81 mg by mouth daily. , Disp: , Rfl:  .  calcium-vitamin D (OSCAL WITH D) 500-200 MG-UNIT per tablet, Take 1 tablet by mouth 2 (two) times daily., Disp: , Rfl:  .  cetirizine (ZYRTEC) 10 MG tablet, Take 10 mg by mouth daily. , Disp: , Rfl:  .  citalopram (CELEXA) 20 MG tablet, Take 20 mg by mouth daily. , Disp: , Rfl:  .  cloNIDine (CATAPRES) 0.3 MG tablet, Take 0.3 mg by mouth 2 (two) times daily., Disp: , Rfl:  .  ferrous sulfate 325 (65 FE) MG tablet, Take 325 mg by mouth daily with breakfast., Disp: , Rfl:  .  fluticasone (FLONASE) 50 MCG/ACT nasal spray, Place 2 sprays into both nostrils daily., Disp: , Rfl:  .  furosemide (  LASIX) 40 MG tablet, Take 40 mg by mouth daily. *Take one tablet 30 minutes after taking Metolazone*, Disp: , Rfl:  .  meclizine (ANTIVERT) 12.5 MG tablet, Take 12.5 mg by mouth daily as needed. , Disp: , Rfl:  .  meloxicam (MOBIC) 7.5 MG tablet, Take 7.5 mg by mouth 2 (two) times daily., Disp: , Rfl:  .  metoprolol succinate (TOPROL-XL) 50 MG 24 hr tablet, Take 50 mg by mouth daily. Take with or immediately following a meal., Disp: , Rfl:  .  Multiple Vitamin (MULTIVITAMIN WITH MINERALS) TABS, Take 1 tablet by mouth daily., Disp: , Rfl:  .  olmesartan (BENICAR) 40 MG tablet, Take 40 mg by mouth daily. , Disp: , Rfl:  .  omeprazole (PRILOSEC) 20 MG capsule, Take 1 capsule by mouth daily., Disp: , Rfl:  .  ondansetron (ZOFRAN ODT) 4 MG disintegrating tablet, Take 1 tablet (4 mg total) by mouth every 8 (eight)  hours as needed for nausea or vomiting., Disp: 20 tablet, Rfl: 0 .  potassium chloride SA (K-DUR,KLOR-CON) 20 MEQ tablet, Take 20-40 mEq by mouth every morning. *Alternate taking one tablet daily and taking two tablets daily*, Disp: , Rfl:  .  pravastatin (PRAVACHOL) 40 MG tablet, Take 40 mg by mouth daily. , Disp: , Rfl:     Physical Exam: Blood pressure (!) 170/92, pulse (!) 58, height '5\' 4"'$  (1.626 m), weight 86.2 kg, SpO2 96 %.    Affect appropriate Healthy:  appears stated age 87: normal Neck supple with no adenopathy JVP normal no bruits no thyromegaly Lungs clear with no wheezing and good diaphragmatic motion Heart:  S1/S2 no murmur, no rub, gallop or click PMI normal Abdomen: benighn, BS positve, no tenderness, no AAA no bruit.  No HSM or HJR Distal pulses intact with no bruits No edema Neuro non-focal Skin warm and dry No muscular weakness  Labs:   Lab Results  Component Value Date   WBC 11.4 (H) 06/25/2019   HGB 14.6 06/25/2019   HCT 45.3 06/25/2019   MCV 97.4 06/25/2019   PLT 323 06/25/2019   No results for input(s): NA, K, CL, CO2, BUN, CREATININE, CALCIUM, PROT, BILITOT, ALKPHOS, ALT, AST, GLUCOSE in the last 168 hours.  Invalid input(s): LABALBU Lab Results  Component Value Date   TROPONINI <0.03 02/04/2017    Lab Results  Component Value Date   CHOL 189 04/07/2009   CHOL 184 03/01/2008   CHOL 172 02/07/2007   Lab Results  Component Value Date   HDL 67 04/07/2009   HDL 65 03/01/2008   HDL 52 02/07/2007   Lab Results  Component Value Date   LDLCALC 110 (H) 04/07/2009   LDLCALC 106 (H) 03/01/2008   LDLCALC 102 (H) 02/07/2007   Lab Results  Component Value Date   TRIG 59 04/07/2009   TRIG 67 03/01/2008   TRIG 90 02/07/2007   Lab Results  Component Value Date   CHOLHDL 2.8 Ratio 04/07/2009   CHOLHDL 2.8 Ratio 03/01/2008   CHOLHDL 3.3 Ratio 02/07/2007   No results found for: LDLDIRECT    Radiology: DG Chest 2 View  Result Date:  01/27/2021 CLINICAL DATA:  Dyspnea. EXAM: CHEST - 2 VIEW COMPARISON:  February 04, 2017. FINDINGS: The heart size and mediastinal contours are within normal limits. Both lungs are clear. No pneumothorax or pleural effusion is noted. Stable elevated right hemidiaphragm is noted. The visualized skeletal structures are unremarkable. IMPRESSION: No active cardiopulmonary disease. Electronically Signed   By: Sabino Dick  Jr M.D.   On: 01/27/2021 14:26   ECHOCARDIOGRAM COMPLETE  Result Date: 01/27/2021    ECHOCARDIOGRAM REPORT   Patient Name:   Dana Ellis Date of Exam: 01/27/2021 Medical Rec #:  [id number]        Height:       72.0 in Accession #:    [id number]       Weight:       172.0 lb Date of Birth:  Jan 02, 1934         BSA:          1.998 m Patient Age:    64 years         BP:           135/81 mmHg Patient Gender: F                HR:           50 bpm. Exam Location:  [facility] Procedure: 2D Echo Indications:    Dyspnea [id number]  History:        Patient has no prior history of Echocardiogram examinations.                 Signs/Symptoms:Dyspnea; Risk Factors:Hypertension and                 Non-Smoker.  Sonographer:    Leavy Cella RDCS (AE) Referring Phys: [place]  1. Left ventricular ejection fraction, by estimation, is 60 to 65%. The left ventricle has normal function. The left ventricle has no regional wall motion abnormalities. There is severe left ventricular hypertrophy. Left ventricular diastolic parameters  are consistent with Grade I diastolic dysfunction (impaired relaxation).  2. Right ventricular systolic function is normal. The right ventricular size is normal.  3. The pericardial effusion is circumferential.  4. The mitral valve is normal in structure. No evidence of mitral valve regurgitation. No evidence of mitral stenosis.  5. The aortic valve is tricuspid. There is mild calcification of the aortic valve. There is mild thickening of the aortic valve. Aortic valve  regurgitation is mild.  6. The inferior vena cava is normal in size with greater than 50% respiratory variability, suggesting right atrial pressure of 3 mmHg. FINDINGS  Left Ventricle: Left ventricular ejection fraction, by estimation, is 60 to 65%. The left ventricle has normal function. The left ventricle has no regional wall motion abnormalities. The left ventricular internal cavity size was normal in size. There is  severe left ventricular hypertrophy. Left ventricular diastolic parameters are consistent with Grade I diastolic dysfunction (impaired relaxation). Normal left ventricular filling pressure. Right Ventricle: The right ventricular size is normal. No increase in right ventricular wall thickness. Right ventricular systolic function is normal. Left Atrium: Left atrial size was normal in size. Right Atrium: Right atrial size was normal in size. Pericardium: Trivial pericardial effusion is present. The pericardial effusion is circumferential. Mitral Valve: The mitral valve is normal in structure. There is mild thickening of the mitral valve leaflet(s). There is mild calcification of the mitral valve leaflet(s). Mild mitral annular calcification. No evidence of mitral valve regurgitation. No evidence of mitral valve stenosis. Tricuspid Valve: The tricuspid valve is normal in structure. Tricuspid valve regurgitation is trivial. No evidence of tricuspid stenosis. Aortic Valve: The aortic valve is tricuspid. There is mild calcification of the aortic valve. There is mild thickening of the aortic valve. There is mild aortic valve annular calcification. Aortic valve regurgitation is mild. Aortic valve mean gradient measures 3.9  mmHg. Aortic valve peak gradient measures 6.4 mmHg. Aortic valve area, by VTI measures 2.90 cm. Pulmonic Valve: The pulmonic valve was not well visualized. Pulmonic valve regurgitation is trivial. No evidence of pulmonic stenosis. Aorta: The aortic root is normal in size and structure.  Pulmonary Artery: Indeterminant PASP, inadequate TR jet. Venous: The inferior vena cava is normal in size with greater than 50% respiratory variability, suggesting right atrial pressure of 3 mmHg. IAS/Shunts: No atrial level shunt detected by color flow Doppler.  LEFT VENTRICLE PLAX 2D LVIDd:         3.35 cm  Diastology LVIDs:         2.27 cm  LV e' medial:    6.85 cm/s LV PW:         1.50 cm  LV E/e' medial:  9.8 LV IVS:        1.50 cm  LV e' lateral:   5.66 cm/s LVOT diam:     2.00 cm  LV E/e' lateral: 11.9 LV SV:         93 LV SV Index:   47 LVOT Area:     3.14 cm  RIGHT VENTRICLE RV S prime:     14.20 cm/s TAPSE (M-mode): 2.0 cm LEFT ATRIUM             Index LA diam:        3.50 cm 1.75 cm/m LA Vol (A2C):   51.1 ml 25.57 ml/m LA Vol (A4C):   52.8 ml 26.43 ml/m LA Biplane Vol: 55.3 ml 27.68 ml/m  AORTIC VALVE AV Area (Vmax):    3.25 cm AV Area (Vmean):   2.87 cm AV Area (VTI):     2.90 cm AV Vmax:           126.91 cm/s AV Vmean:          95.373 cm/s AV VTI:            0.322 m AV Peak Grad:      6.4 mmHg AV Mean Grad:      3.9 mmHg LVOT Vmax:         131.27 cm/s LVOT Vmean:        86.991 cm/s LVOT VTI:          0.297 m LVOT/AV VTI ratio: 0.92  AORTA Ao Root diam: 3.10 cm MITRAL VALVE                TRICUSPID VALVE MV Area (PHT): 1.66 cm     TR Peak grad:   25.6 mmHg MV Decel Time: 458 msec     TR Vmax:        253.00 cm/s MV E velocity: 67.30 cm/s MV A velocity: 107.00 cm/s  SHUNTS MV E/A ratio:  0.63         Systemic VTI:  0.30 m                             Systemic Diam: 2.00 cm Carlyle Dolly MD Electronically signed by Carlyle Dolly MD Signature Date/Time: 01/27/2021/11:25:17 AM    Final     EKG: SR non specific ST changes    ASSESSMENT AND PLAN:   1. Dyspnea: functional due to age and scoliosis may have "stiff heart" with LVH but euvolemic And really not very symptomatic She may have senile amyloid but given age I don't think this Would be Rx. Continue control of BP 2. HTN: consider  increasing  norvasc if needed in future  3. Murmur AV sclerosis by echo AR not audible given age would re check echo only if new symptoms  F/U cardiology PRN  Signed: Jenkins Rouge 01/29/2021, 2:41 PM

## 2021-01-25 ENCOUNTER — Telehealth: Payer: Self-pay | Admitting: Cardiovascular Disease

## 2021-01-25 NOTE — Telephone Encounter (Signed)
Pre-cert Verification for the following procedure    [facility]  DATE: 01/26/2021  [place]

## 2021-01-26 ENCOUNTER — Other Ambulatory Visit (HOSPITAL_COMMUNITY): Payer: Medicare HMO

## 2021-01-26 DIAGNOSIS — I129 Hypertensive chronic kidney disease with stage 1 through stage 4 chronic kidney disease, or unspecified chronic kidney disease: Secondary | ICD-10-CM | POA: Diagnosis not present

## 2021-01-26 DIAGNOSIS — N182 Chronic kidney disease, stage 2 (mild): Secondary | ICD-10-CM | POA: Diagnosis not present

## 2021-01-26 DIAGNOSIS — R011 Cardiac murmur, unspecified: Secondary | ICD-10-CM | POA: Diagnosis not present

## 2021-01-26 DIAGNOSIS — E782 Mixed hyperlipidemia: Secondary | ICD-10-CM | POA: Diagnosis not present

## 2021-01-26 DIAGNOSIS — D509 Iron deficiency anemia, unspecified: Secondary | ICD-10-CM | POA: Diagnosis not present

## 2021-01-26 DIAGNOSIS — R69 Illness, unspecified: Secondary | ICD-10-CM | POA: Diagnosis not present

## 2021-01-26 DIAGNOSIS — J302 Other seasonal allergic rhinitis: Secondary | ICD-10-CM | POA: Diagnosis not present

## 2021-01-26 DIAGNOSIS — K219 Gastro-esophageal reflux disease without esophagitis: Secondary | ICD-10-CM | POA: Diagnosis not present

## 2021-01-26 DIAGNOSIS — M1990 Unspecified osteoarthritis, unspecified site: Secondary | ICD-10-CM | POA: Diagnosis not present

## 2021-01-26 DIAGNOSIS — Z7409 Other reduced mobility: Secondary | ICD-10-CM | POA: Diagnosis not present

## 2021-01-27 ENCOUNTER — Other Ambulatory Visit: Payer: Self-pay

## 2021-01-27 ENCOUNTER — Ambulatory Visit (HOSPITAL_COMMUNITY)
Admission: RE | Admit: 2021-01-27 | Discharge: 2021-01-27 | Disposition: A | Discharge status: Home / Self Care | Payer: Medicare HMO | Source: Ambulatory Visit | Attending: Cardiovascular Disease | Admitting: Cardiovascular Disease

## 2021-01-27 DIAGNOSIS — R0600 Dyspnea, unspecified: Secondary | ICD-10-CM

## 2021-01-27 LAB — ECHOCARDIOGRAM COMPLETE
AR max vel: 3.25 cm2
AV Area VTI: 2.9 cm2
AV Area mean vel: 2.87 cm2
AV Mean grad: 3.9 mmHg
AV Peak grad: 6.4 mmHg
Ao pk vel: 1.27 m/s
Area-P 1/2: 1.66 cm2
S' Lateral: 2.27 cm

## 2021-01-27 NOTE — Progress Notes (Signed)
*  PRELIMINARY RESULTS* Echocardiogram 2D Echocardiogram has been performed.  Leavy Cella 01/27/2021, 9:19 AM

## 2021-01-29 ENCOUNTER — Encounter: Payer: Self-pay | Admitting: Cardiovascular Disease

## 2021-01-29 ENCOUNTER — Ambulatory Visit (INDEPENDENT_AMBULATORY_CARE_PROVIDER_SITE_OTHER): Payer: Medicare HMO | Admitting: Cardiovascular Disease

## 2021-01-29 ENCOUNTER — Other Ambulatory Visit: Payer: Self-pay

## 2021-01-29 VITALS — BP 170/92 | HR 58 | Ht 64.0 in | Wt 190.0 lb

## 2021-01-29 DIAGNOSIS — R0600 Dyspnea, unspecified: Secondary | ICD-10-CM

## 2021-01-29 DIAGNOSIS — R011 Cardiac murmur, unspecified: Secondary | ICD-10-CM

## 2021-01-29 DIAGNOSIS — I10 Essential (primary) hypertension: Secondary | ICD-10-CM | POA: Diagnosis not present

## 2021-01-29 NOTE — Patient Instructions (Signed)
Medication Instructions:  Your physician recommends that you continue on your current medications as directed. Please refer to the Current Medication list given to you today.  *If you need a refill on your cardiac medications before your next appointment, please call your pharmacy*   Lab Work: NONE   If you have labs (blood work) drawn today and your tests are completely normal, you will receive your results only by: MyChart Message (if you have MyChart) OR A paper copy in the mail If you have any lab test that is abnormal or we need to change your treatment, we will call you to review the results.   Testing/Procedures: NONE    Follow-Up: At [facility], you and your health needs are our priority.  As part of our continuing mission to provide you with exceptional heart care, we have created designated Provider Care Teams.  These Care Teams include your primary Cardiologist (physician) and Advanced Practice Providers (APPs -  Physician Assistants and Nurse Practitioners) who all work together to provide you with the care you need, when you need it.  We recommend signing up for the patient portal called "MyChart".  Sign up information is provided on this After Visit Summary.  MyChart is used to connect with patients for Virtual Visits (Telemedicine).  Patients are able to view lab/test results, encounter notes, upcoming appointments, etc.  Non-urgent messages can be sent to your provider as well.   To learn more about what you can do with MyChart, go to [email or web address].    Your next appointment:    As Needed   The format for your next appointment:   In Person  Provider:   Peter Nishan, MD   Other Instructions Thank you for choosing [facility]!    

## 2021-02-15 DIAGNOSIS — R7301 Impaired fasting glucose: Secondary | ICD-10-CM | POA: Diagnosis not present

## 2021-02-15 DIAGNOSIS — I10 Essential (primary) hypertension: Secondary | ICD-10-CM | POA: Diagnosis not present

## 2021-02-15 DIAGNOSIS — N182 Chronic kidney disease, stage 2 (mild): Secondary | ICD-10-CM | POA: Diagnosis not present

## 2021-02-15 DIAGNOSIS — R69 Illness, unspecified: Secondary | ICD-10-CM | POA: Diagnosis not present

## 2021-02-15 DIAGNOSIS — D509 Iron deficiency anemia, unspecified: Secondary | ICD-10-CM | POA: Diagnosis not present

## 2021-02-15 DIAGNOSIS — E782 Mixed hyperlipidemia: Secondary | ICD-10-CM | POA: Diagnosis not present

## 2021-02-15 DIAGNOSIS — R001 Bradycardia, unspecified: Secondary | ICD-10-CM | POA: Diagnosis not present

## 2021-02-15 DIAGNOSIS — K219 Gastro-esophageal reflux disease without esophagitis: Secondary | ICD-10-CM | POA: Diagnosis not present

## 2021-02-15 DIAGNOSIS — I119 Hypertensive heart disease without heart failure: Secondary | ICD-10-CM | POA: Diagnosis not present

## 2021-02-15 DIAGNOSIS — N189 Chronic kidney disease, unspecified: Secondary | ICD-10-CM | POA: Diagnosis not present

## 2021-03-17 DIAGNOSIS — I119 Hypertensive heart disease without heart failure: Secondary | ICD-10-CM | POA: Diagnosis not present

## 2021-03-17 DIAGNOSIS — D509 Iron deficiency anemia, unspecified: Secondary | ICD-10-CM | POA: Diagnosis not present

## 2021-03-17 DIAGNOSIS — R7301 Impaired fasting glucose: Secondary | ICD-10-CM | POA: Diagnosis not present

## 2021-03-17 DIAGNOSIS — N182 Chronic kidney disease, stage 2 (mild): Secondary | ICD-10-CM | POA: Diagnosis not present

## 2021-03-17 DIAGNOSIS — N189 Chronic kidney disease, unspecified: Secondary | ICD-10-CM | POA: Diagnosis not present

## 2021-03-17 DIAGNOSIS — R001 Bradycardia, unspecified: Secondary | ICD-10-CM | POA: Diagnosis not present

## 2021-03-17 DIAGNOSIS — R69 Illness, unspecified: Secondary | ICD-10-CM | POA: Diagnosis not present

## 2021-03-17 DIAGNOSIS — I10 Essential (primary) hypertension: Secondary | ICD-10-CM | POA: Diagnosis not present

## 2021-03-17 DIAGNOSIS — K219 Gastro-esophageal reflux disease without esophagitis: Secondary | ICD-10-CM | POA: Diagnosis not present

## 2021-03-17 DIAGNOSIS — E782 Mixed hyperlipidemia: Secondary | ICD-10-CM | POA: Diagnosis not present

## 2021-04-18 DIAGNOSIS — Z0001 Encounter for general adult medical examination with abnormal findings: Secondary | ICD-10-CM | POA: Diagnosis not present

## 2021-04-18 DIAGNOSIS — J302 Other seasonal allergic rhinitis: Secondary | ICD-10-CM | POA: Diagnosis not present

## 2021-04-18 DIAGNOSIS — R69 Illness, unspecified: Secondary | ICD-10-CM | POA: Diagnosis not present

## 2021-04-18 DIAGNOSIS — R011 Cardiac murmur, unspecified: Secondary | ICD-10-CM | POA: Diagnosis not present

## 2021-04-18 DIAGNOSIS — E1165 Type 2 diabetes mellitus with hyperglycemia: Secondary | ICD-10-CM | POA: Diagnosis not present

## 2021-04-18 DIAGNOSIS — D509 Iron deficiency anemia, unspecified: Secondary | ICD-10-CM | POA: Diagnosis not present

## 2021-04-18 DIAGNOSIS — I129 Hypertensive chronic kidney disease with stage 1 through stage 4 chronic kidney disease, or unspecified chronic kidney disease: Secondary | ICD-10-CM | POA: Diagnosis not present

## 2021-04-18 DIAGNOSIS — K219 Gastro-esophageal reflux disease without esophagitis: Secondary | ICD-10-CM | POA: Diagnosis not present

## 2021-04-18 DIAGNOSIS — M1990 Unspecified osteoarthritis, unspecified site: Secondary | ICD-10-CM | POA: Diagnosis not present

## 2021-04-18 DIAGNOSIS — Z7409 Other reduced mobility: Secondary | ICD-10-CM | POA: Diagnosis not present

## 2021-04-18 DIAGNOSIS — E782 Mixed hyperlipidemia: Secondary | ICD-10-CM | POA: Diagnosis not present

## 2021-04-18 DIAGNOSIS — I10 Essential (primary) hypertension: Secondary | ICD-10-CM | POA: Diagnosis not present

## 2021-04-27 DIAGNOSIS — E559 Vitamin D deficiency, unspecified: Secondary | ICD-10-CM | POA: Diagnosis not present

## 2021-04-27 DIAGNOSIS — E1122 Type 2 diabetes mellitus with diabetic chronic kidney disease: Secondary | ICD-10-CM | POA: Diagnosis not present

## 2021-04-27 DIAGNOSIS — E7849 Other hyperlipidemia: Secondary | ICD-10-CM | POA: Diagnosis not present

## 2021-04-27 DIAGNOSIS — I10 Essential (primary) hypertension: Secondary | ICD-10-CM | POA: Diagnosis not present

## 2021-05-05 DIAGNOSIS — R011 Cardiac murmur, unspecified: Secondary | ICD-10-CM | POA: Diagnosis not present

## 2021-05-05 DIAGNOSIS — I129 Hypertensive chronic kidney disease with stage 1 through stage 4 chronic kidney disease, or unspecified chronic kidney disease: Secondary | ICD-10-CM | POA: Diagnosis not present

## 2021-05-05 DIAGNOSIS — Z7409 Other reduced mobility: Secondary | ICD-10-CM | POA: Diagnosis not present

## 2021-05-05 DIAGNOSIS — D509 Iron deficiency anemia, unspecified: Secondary | ICD-10-CM | POA: Diagnosis not present

## 2021-05-05 DIAGNOSIS — Z0001 Encounter for general adult medical examination with abnormal findings: Secondary | ICD-10-CM | POA: Diagnosis not present

## 2021-05-05 DIAGNOSIS — J302 Other seasonal allergic rhinitis: Secondary | ICD-10-CM | POA: Diagnosis not present

## 2021-05-05 DIAGNOSIS — K219 Gastro-esophageal reflux disease without esophagitis: Secondary | ICD-10-CM | POA: Diagnosis not present

## 2021-05-05 DIAGNOSIS — E1165 Type 2 diabetes mellitus with hyperglycemia: Secondary | ICD-10-CM | POA: Diagnosis not present

## 2021-05-05 DIAGNOSIS — R69 Illness, unspecified: Secondary | ICD-10-CM | POA: Diagnosis not present

## 2021-05-05 DIAGNOSIS — E782 Mixed hyperlipidemia: Secondary | ICD-10-CM | POA: Diagnosis not present

## 2021-08-09 DIAGNOSIS — E1165 Type 2 diabetes mellitus with hyperglycemia: Secondary | ICD-10-CM | POA: Diagnosis not present

## 2021-08-09 DIAGNOSIS — I10 Essential (primary) hypertension: Secondary | ICD-10-CM | POA: Diagnosis not present

## 2021-08-12 DIAGNOSIS — M1990 Unspecified osteoarthritis, unspecified site: Secondary | ICD-10-CM | POA: Diagnosis not present

## 2021-08-12 DIAGNOSIS — I10 Essential (primary) hypertension: Secondary | ICD-10-CM | POA: Diagnosis not present

## 2021-08-20 DIAGNOSIS — K219 Gastro-esophageal reflux disease without esophagitis: Secondary | ICD-10-CM | POA: Diagnosis not present

## 2021-08-20 DIAGNOSIS — E782 Mixed hyperlipidemia: Secondary | ICD-10-CM | POA: Diagnosis not present

## 2021-08-20 DIAGNOSIS — I129 Hypertensive chronic kidney disease with stage 1 through stage 4 chronic kidney disease, or unspecified chronic kidney disease: Secondary | ICD-10-CM | POA: Diagnosis not present

## 2021-08-20 DIAGNOSIS — N1832 Chronic kidney disease, stage 3b: Secondary | ICD-10-CM | POA: Diagnosis not present

## 2021-08-20 DIAGNOSIS — R011 Cardiac murmur, unspecified: Secondary | ICD-10-CM | POA: Diagnosis not present

## 2021-08-20 DIAGNOSIS — D509 Iron deficiency anemia, unspecified: Secondary | ICD-10-CM | POA: Diagnosis not present

## 2021-08-20 DIAGNOSIS — Z0001 Encounter for general adult medical examination with abnormal findings: Secondary | ICD-10-CM | POA: Diagnosis not present

## 2021-08-20 DIAGNOSIS — Z7409 Other reduced mobility: Secondary | ICD-10-CM | POA: Diagnosis not present

## 2021-08-20 DIAGNOSIS — E1165 Type 2 diabetes mellitus with hyperglycemia: Secondary | ICD-10-CM | POA: Diagnosis not present

## 2021-08-20 DIAGNOSIS — R69 Illness, unspecified: Secondary | ICD-10-CM | POA: Diagnosis not present

## 2021-09-17 DIAGNOSIS — I10 Essential (primary) hypertension: Secondary | ICD-10-CM | POA: Diagnosis not present

## 2021-09-17 DIAGNOSIS — M1990 Unspecified osteoarthritis, unspecified site: Secondary | ICD-10-CM | POA: Diagnosis not present

## 2021-09-30 DIAGNOSIS — E119 Type 2 diabetes mellitus without complications: Secondary | ICD-10-CM | POA: Diagnosis not present

## 2021-10-18 DIAGNOSIS — I10 Essential (primary) hypertension: Secondary | ICD-10-CM | POA: Diagnosis not present

## 2021-10-18 DIAGNOSIS — E785 Hyperlipidemia, unspecified: Secondary | ICD-10-CM | POA: Diagnosis not present

## 2021-11-16 DIAGNOSIS — R809 Proteinuria, unspecified: Secondary | ICD-10-CM | POA: Diagnosis not present

## 2021-11-16 DIAGNOSIS — E559 Vitamin D deficiency, unspecified: Secondary | ICD-10-CM | POA: Diagnosis not present

## 2021-11-16 DIAGNOSIS — E1165 Type 2 diabetes mellitus with hyperglycemia: Secondary | ICD-10-CM | POA: Diagnosis not present

## 2021-11-19 DIAGNOSIS — Z23 Encounter for immunization: Secondary | ICD-10-CM | POA: Diagnosis not present

## 2021-11-19 DIAGNOSIS — E782 Mixed hyperlipidemia: Secondary | ICD-10-CM | POA: Diagnosis not present

## 2021-11-19 DIAGNOSIS — R011 Cardiac murmur, unspecified: Secondary | ICD-10-CM | POA: Diagnosis not present

## 2021-11-19 DIAGNOSIS — D509 Iron deficiency anemia, unspecified: Secondary | ICD-10-CM | POA: Diagnosis not present

## 2021-11-19 DIAGNOSIS — N1832 Chronic kidney disease, stage 3b: Secondary | ICD-10-CM | POA: Diagnosis not present

## 2021-11-19 DIAGNOSIS — Z7409 Other reduced mobility: Secondary | ICD-10-CM | POA: Diagnosis not present

## 2021-11-19 DIAGNOSIS — I129 Hypertensive chronic kidney disease with stage 1 through stage 4 chronic kidney disease, or unspecified chronic kidney disease: Secondary | ICD-10-CM | POA: Diagnosis not present

## 2021-11-19 DIAGNOSIS — E1165 Type 2 diabetes mellitus with hyperglycemia: Secondary | ICD-10-CM | POA: Diagnosis not present

## 2021-11-19 DIAGNOSIS — R69 Illness, unspecified: Secondary | ICD-10-CM | POA: Diagnosis not present

## 2021-11-19 DIAGNOSIS — K219 Gastro-esophageal reflux disease without esophagitis: Secondary | ICD-10-CM | POA: Diagnosis not present

## 2021-12-17 DIAGNOSIS — I10 Essential (primary) hypertension: Secondary | ICD-10-CM | POA: Diagnosis not present

## 2021-12-17 DIAGNOSIS — E782 Mixed hyperlipidemia: Secondary | ICD-10-CM | POA: Diagnosis not present

## 2021-12-31 DIAGNOSIS — E119 Type 2 diabetes mellitus without complications: Secondary | ICD-10-CM | POA: Diagnosis not present

## 2022-01-20 DIAGNOSIS — E1165 Type 2 diabetes mellitus with hyperglycemia: Secondary | ICD-10-CM | POA: Diagnosis not present

## 2022-02-14 DIAGNOSIS — R809 Proteinuria, unspecified: Secondary | ICD-10-CM | POA: Diagnosis not present

## 2022-02-14 DIAGNOSIS — E1165 Type 2 diabetes mellitus with hyperglycemia: Secondary | ICD-10-CM | POA: Diagnosis not present

## 2022-02-14 DIAGNOSIS — E559 Vitamin D deficiency, unspecified: Secondary | ICD-10-CM | POA: Diagnosis not present

## 2022-02-17 DIAGNOSIS — J302 Other seasonal allergic rhinitis: Secondary | ICD-10-CM | POA: Diagnosis not present

## 2022-02-17 DIAGNOSIS — E1165 Type 2 diabetes mellitus with hyperglycemia: Secondary | ICD-10-CM | POA: Diagnosis not present

## 2022-02-17 DIAGNOSIS — I129 Hypertensive chronic kidney disease with stage 1 through stage 4 chronic kidney disease, or unspecified chronic kidney disease: Secondary | ICD-10-CM | POA: Diagnosis not present

## 2022-02-17 DIAGNOSIS — R69 Illness, unspecified: Secondary | ICD-10-CM | POA: Diagnosis not present

## 2022-02-17 DIAGNOSIS — D509 Iron deficiency anemia, unspecified: Secondary | ICD-10-CM | POA: Diagnosis not present

## 2022-02-17 DIAGNOSIS — K219 Gastro-esophageal reflux disease without esophagitis: Secondary | ICD-10-CM | POA: Diagnosis not present

## 2022-02-17 DIAGNOSIS — Z7409 Other reduced mobility: Secondary | ICD-10-CM | POA: Diagnosis not present

## 2022-02-17 DIAGNOSIS — E782 Mixed hyperlipidemia: Secondary | ICD-10-CM | POA: Diagnosis not present

## 2022-02-17 DIAGNOSIS — R011 Cardiac murmur, unspecified: Secondary | ICD-10-CM | POA: Diagnosis not present

## 2022-02-17 DIAGNOSIS — R809 Proteinuria, unspecified: Secondary | ICD-10-CM | POA: Diagnosis not present

## 2022-03-14 DIAGNOSIS — M1712 Unilateral primary osteoarthritis, left knee: Secondary | ICD-10-CM | POA: Diagnosis not present

## 2022-03-14 DIAGNOSIS — M4716 Other spondylosis with myelopathy, lumbar region: Secondary | ICD-10-CM | POA: Diagnosis not present

## 2022-05-26 DIAGNOSIS — E1165 Type 2 diabetes mellitus with hyperglycemia: Secondary | ICD-10-CM | POA: Diagnosis not present

## 2022-05-26 DIAGNOSIS — E559 Vitamin D deficiency, unspecified: Secondary | ICD-10-CM | POA: Diagnosis not present

## 2022-05-26 DIAGNOSIS — Z7409 Other reduced mobility: Secondary | ICD-10-CM | POA: Diagnosis not present

## 2022-05-26 DIAGNOSIS — E782 Mixed hyperlipidemia: Secondary | ICD-10-CM | POA: Diagnosis not present

## 2022-05-26 DIAGNOSIS — D509 Iron deficiency anemia, unspecified: Secondary | ICD-10-CM | POA: Diagnosis not present

## 2022-05-26 DIAGNOSIS — R69 Illness, unspecified: Secondary | ICD-10-CM | POA: Diagnosis not present

## 2022-05-26 DIAGNOSIS — R011 Cardiac murmur, unspecified: Secondary | ICD-10-CM | POA: Diagnosis not present

## 2022-05-26 DIAGNOSIS — R809 Proteinuria, unspecified: Secondary | ICD-10-CM | POA: Diagnosis not present

## 2022-05-26 DIAGNOSIS — J302 Other seasonal allergic rhinitis: Secondary | ICD-10-CM | POA: Diagnosis not present

## 2022-05-26 DIAGNOSIS — I129 Hypertensive chronic kidney disease with stage 1 through stage 4 chronic kidney disease, or unspecified chronic kidney disease: Secondary | ICD-10-CM | POA: Diagnosis not present

## 2022-05-26 DIAGNOSIS — K219 Gastro-esophageal reflux disease without esophagitis: Secondary | ICD-10-CM | POA: Diagnosis not present

## 2022-10-28 DIAGNOSIS — E785 Hyperlipidemia, unspecified: Secondary | ICD-10-CM | POA: Diagnosis not present

## 2022-10-28 DIAGNOSIS — M1990 Unspecified osteoarthritis, unspecified site: Secondary | ICD-10-CM | POA: Diagnosis not present

## 2022-10-28 DIAGNOSIS — E1151 Type 2 diabetes mellitus with diabetic peripheral angiopathy without gangrene: Secondary | ICD-10-CM | POA: Diagnosis not present

## 2022-10-28 DIAGNOSIS — K08109 Complete loss of teeth, unspecified cause, unspecified class: Secondary | ICD-10-CM | POA: Diagnosis not present

## 2022-10-28 DIAGNOSIS — K219 Gastro-esophageal reflux disease without esophagitis: Secondary | ICD-10-CM | POA: Diagnosis not present

## 2022-10-28 DIAGNOSIS — F3342 Major depressive disorder, recurrent, in full remission: Secondary | ICD-10-CM | POA: Diagnosis not present

## 2022-10-28 DIAGNOSIS — N184 Chronic kidney disease, stage 4 (severe): Secondary | ICD-10-CM | POA: Diagnosis not present

## 2022-10-28 DIAGNOSIS — I130 Hypertensive heart and chronic kidney disease with heart failure and stage 1 through stage 4 chronic kidney disease, or unspecified chronic kidney disease: Secondary | ICD-10-CM | POA: Diagnosis not present

## 2022-10-28 DIAGNOSIS — I25119 Atherosclerotic heart disease of native coronary artery with unspecified angina pectoris: Secondary | ICD-10-CM | POA: Diagnosis not present

## 2022-10-28 DIAGNOSIS — Z008 Encounter for other general examination: Secondary | ICD-10-CM | POA: Diagnosis not present

## 2022-10-28 DIAGNOSIS — I509 Heart failure, unspecified: Secondary | ICD-10-CM | POA: Diagnosis not present

## 2022-10-28 DIAGNOSIS — J302 Other seasonal allergic rhinitis: Secondary | ICD-10-CM | POA: Diagnosis not present

## 2022-10-28 DIAGNOSIS — R69 Illness, unspecified: Secondary | ICD-10-CM | POA: Diagnosis not present

## 2022-10-28 DIAGNOSIS — F419 Anxiety disorder, unspecified: Secondary | ICD-10-CM | POA: Diagnosis not present

## 2022-12-08 DIAGNOSIS — E559 Vitamin D deficiency, unspecified: Secondary | ICD-10-CM | POA: Diagnosis not present

## 2022-12-08 DIAGNOSIS — E1165 Type 2 diabetes mellitus with hyperglycemia: Secondary | ICD-10-CM | POA: Diagnosis not present

## 2022-12-08 DIAGNOSIS — R809 Proteinuria, unspecified: Secondary | ICD-10-CM | POA: Diagnosis not present

## 2022-12-14 DIAGNOSIS — J302 Other seasonal allergic rhinitis: Secondary | ICD-10-CM | POA: Diagnosis not present

## 2022-12-14 DIAGNOSIS — R69 Illness, unspecified: Secondary | ICD-10-CM | POA: Diagnosis not present

## 2022-12-14 DIAGNOSIS — M25551 Pain in right hip: Secondary | ICD-10-CM | POA: Diagnosis not present

## 2022-12-14 DIAGNOSIS — R809 Proteinuria, unspecified: Secondary | ICD-10-CM | POA: Diagnosis not present

## 2022-12-14 DIAGNOSIS — Z7409 Other reduced mobility: Secondary | ICD-10-CM | POA: Diagnosis not present

## 2022-12-14 DIAGNOSIS — E782 Mixed hyperlipidemia: Secondary | ICD-10-CM | POA: Diagnosis not present

## 2022-12-14 DIAGNOSIS — E1129 Type 2 diabetes mellitus with other diabetic kidney complication: Secondary | ICD-10-CM | POA: Diagnosis not present

## 2022-12-14 DIAGNOSIS — I129 Hypertensive chronic kidney disease with stage 1 through stage 4 chronic kidney disease, or unspecified chronic kidney disease: Secondary | ICD-10-CM | POA: Diagnosis not present

## 2022-12-14 DIAGNOSIS — I700 Atherosclerosis of aorta: Secondary | ICD-10-CM | POA: Diagnosis not present

## 2022-12-14 DIAGNOSIS — R011 Cardiac murmur, unspecified: Secondary | ICD-10-CM | POA: Diagnosis not present

## 2022-12-14 DIAGNOSIS — E559 Vitamin D deficiency, unspecified: Secondary | ICD-10-CM | POA: Diagnosis not present

## 2022-12-14 DIAGNOSIS — D509 Iron deficiency anemia, unspecified: Secondary | ICD-10-CM | POA: Diagnosis not present

## 2023-01-06 DIAGNOSIS — N189 Chronic kidney disease, unspecified: Secondary | ICD-10-CM | POA: Diagnosis not present

## 2023-01-06 DIAGNOSIS — Z713 Dietary counseling and surveillance: Secondary | ICD-10-CM | POA: Diagnosis not present

## 2023-01-06 DIAGNOSIS — Z79899 Other long term (current) drug therapy: Secondary | ICD-10-CM | POA: Diagnosis not present

## 2023-01-06 DIAGNOSIS — E860 Dehydration: Secondary | ICD-10-CM | POA: Diagnosis not present

## 2023-01-06 DIAGNOSIS — I129 Hypertensive chronic kidney disease with stage 1 through stage 4 chronic kidney disease, or unspecified chronic kidney disease: Secondary | ICD-10-CM | POA: Diagnosis not present

## 2023-01-06 DIAGNOSIS — R300 Dysuria: Secondary | ICD-10-CM | POA: Diagnosis not present

## 2023-01-06 DIAGNOSIS — Z681 Body mass index (BMI) 19 or less, adult: Secondary | ICD-10-CM | POA: Diagnosis not present

## 2023-01-13 DIAGNOSIS — I951 Orthostatic hypotension: Secondary | ICD-10-CM | POA: Diagnosis not present

## 2023-01-13 DIAGNOSIS — E860 Dehydration: Secondary | ICD-10-CM | POA: Diagnosis not present

## 2023-01-18 DIAGNOSIS — R3129 Other microscopic hematuria: Secondary | ICD-10-CM | POA: Diagnosis not present

## 2023-01-18 DIAGNOSIS — Z7689 Persons encountering health services in other specified circumstances: Secondary | ICD-10-CM | POA: Diagnosis not present

## 2023-01-18 DIAGNOSIS — N390 Urinary tract infection, site not specified: Secondary | ICD-10-CM | POA: Diagnosis not present

## 2023-01-18 DIAGNOSIS — I739 Peripheral vascular disease, unspecified: Secondary | ICD-10-CM | POA: Diagnosis not present

## 2023-01-18 DIAGNOSIS — E860 Dehydration: Secondary | ICD-10-CM | POA: Diagnosis not present

## 2023-02-02 DIAGNOSIS — I10 Essential (primary) hypertension: Secondary | ICD-10-CM | POA: Diagnosis not present

## 2023-02-02 DIAGNOSIS — R634 Abnormal weight loss: Secondary | ICD-10-CM | POA: Diagnosis not present

## 2023-02-02 DIAGNOSIS — Z681 Body mass index (BMI) 19 or less, adult: Secondary | ICD-10-CM | POA: Diagnosis not present

## 2023-03-22 DIAGNOSIS — E782 Mixed hyperlipidemia: Secondary | ICD-10-CM | POA: Diagnosis not present

## 2023-03-22 DIAGNOSIS — E1165 Type 2 diabetes mellitus with hyperglycemia: Secondary | ICD-10-CM | POA: Diagnosis not present

## 2023-03-22 DIAGNOSIS — I10 Essential (primary) hypertension: Secondary | ICD-10-CM | POA: Diagnosis not present

## 2023-03-28 DIAGNOSIS — L97329 Non-pressure chronic ulcer of left ankle with unspecified severity: Secondary | ICD-10-CM | POA: Diagnosis not present

## 2023-03-28 DIAGNOSIS — F329 Major depressive disorder, single episode, unspecified: Secondary | ICD-10-CM | POA: Diagnosis not present

## 2023-03-28 DIAGNOSIS — D509 Iron deficiency anemia, unspecified: Secondary | ICD-10-CM | POA: Diagnosis not present

## 2023-03-28 DIAGNOSIS — Z0001 Encounter for general adult medical examination with abnormal findings: Secondary | ICD-10-CM | POA: Diagnosis not present

## 2023-03-28 DIAGNOSIS — I129 Hypertensive chronic kidney disease with stage 1 through stage 4 chronic kidney disease, or unspecified chronic kidney disease: Secondary | ICD-10-CM | POA: Diagnosis not present

## 2023-03-28 DIAGNOSIS — N1831 Chronic kidney disease, stage 3a: Secondary | ICD-10-CM | POA: Diagnosis not present

## 2023-03-28 DIAGNOSIS — E782 Mixed hyperlipidemia: Secondary | ICD-10-CM | POA: Diagnosis not present

## 2023-03-28 DIAGNOSIS — E1122 Type 2 diabetes mellitus with diabetic chronic kidney disease: Secondary | ICD-10-CM | POA: Diagnosis not present

## 2023-03-28 DIAGNOSIS — I739 Peripheral vascular disease, unspecified: Secondary | ICD-10-CM | POA: Diagnosis not present

## 2023-03-28 DIAGNOSIS — E1129 Type 2 diabetes mellitus with other diabetic kidney complication: Secondary | ICD-10-CM | POA: Diagnosis not present

## 2023-03-28 DIAGNOSIS — E1151 Type 2 diabetes mellitus with diabetic peripheral angiopathy without gangrene: Secondary | ICD-10-CM | POA: Diagnosis not present

## 2023-03-28 DIAGNOSIS — I10 Essential (primary) hypertension: Secondary | ICD-10-CM | POA: Diagnosis not present

## 2023-04-04 DIAGNOSIS — E782 Mixed hyperlipidemia: Secondary | ICD-10-CM | POA: Diagnosis not present

## 2023-04-04 DIAGNOSIS — E1151 Type 2 diabetes mellitus with diabetic peripheral angiopathy without gangrene: Secondary | ICD-10-CM | POA: Diagnosis not present

## 2023-04-04 DIAGNOSIS — E1165 Type 2 diabetes mellitus with hyperglycemia: Secondary | ICD-10-CM | POA: Diagnosis not present

## 2023-04-04 DIAGNOSIS — J309 Allergic rhinitis, unspecified: Secondary | ICD-10-CM | POA: Diagnosis not present

## 2023-04-04 DIAGNOSIS — F32A Depression, unspecified: Secondary | ICD-10-CM | POA: Diagnosis not present

## 2023-04-04 DIAGNOSIS — G40909 Epilepsy, unspecified, not intractable, without status epilepticus: Secondary | ICD-10-CM | POA: Diagnosis not present

## 2023-04-04 DIAGNOSIS — N184 Chronic kidney disease, stage 4 (severe): Secondary | ICD-10-CM | POA: Diagnosis not present

## 2023-04-04 DIAGNOSIS — I129 Hypertensive chronic kidney disease with stage 1 through stage 4 chronic kidney disease, or unspecified chronic kidney disease: Secondary | ICD-10-CM | POA: Diagnosis not present

## 2023-04-04 DIAGNOSIS — E559 Vitamin D deficiency, unspecified: Secondary | ICD-10-CM | POA: Diagnosis not present

## 2023-04-04 DIAGNOSIS — R634 Abnormal weight loss: Secondary | ICD-10-CM | POA: Diagnosis not present

## 2023-04-04 DIAGNOSIS — K219 Gastro-esophageal reflux disease without esophagitis: Secondary | ICD-10-CM | POA: Diagnosis not present

## 2023-04-04 DIAGNOSIS — I700 Atherosclerosis of aorta: Secondary | ICD-10-CM | POA: Diagnosis not present

## 2023-04-04 DIAGNOSIS — S91002D Unspecified open wound, left ankle, subsequent encounter: Secondary | ICD-10-CM | POA: Diagnosis not present

## 2023-04-04 DIAGNOSIS — Z794 Long term (current) use of insulin: Secondary | ICD-10-CM | POA: Diagnosis not present

## 2023-04-04 DIAGNOSIS — E1122 Type 2 diabetes mellitus with diabetic chronic kidney disease: Secondary | ICD-10-CM | POA: Diagnosis not present

## 2023-04-04 DIAGNOSIS — D509 Iron deficiency anemia, unspecified: Secondary | ICD-10-CM | POA: Diagnosis not present

## 2023-04-20 DIAGNOSIS — I10 Essential (primary) hypertension: Secondary | ICD-10-CM | POA: Diagnosis not present

## 2023-04-20 DIAGNOSIS — Z7984 Long term (current) use of oral hypoglycemic drugs: Secondary | ICD-10-CM | POA: Diagnosis not present

## 2023-04-20 DIAGNOSIS — Z713 Dietary counseling and surveillance: Secondary | ICD-10-CM | POA: Diagnosis not present

## 2023-04-20 DIAGNOSIS — Z681 Body mass index (BMI) 19 or less, adult: Secondary | ICD-10-CM | POA: Diagnosis not present

## 2023-04-20 DIAGNOSIS — Z794 Long term (current) use of insulin: Secondary | ICD-10-CM | POA: Diagnosis not present

## 2023-04-20 DIAGNOSIS — E1129 Type 2 diabetes mellitus with other diabetic kidney complication: Secondary | ICD-10-CM | POA: Diagnosis not present

## 2023-04-20 DIAGNOSIS — Z7182 Exercise counseling: Secondary | ICD-10-CM | POA: Diagnosis not present

## 2023-05-24 DIAGNOSIS — S91002D Unspecified open wound, left ankle, subsequent encounter: Secondary | ICD-10-CM | POA: Diagnosis not present

## 2023-06-30 DIAGNOSIS — E559 Vitamin D deficiency, unspecified: Secondary | ICD-10-CM | POA: Diagnosis not present

## 2023-06-30 DIAGNOSIS — E782 Mixed hyperlipidemia: Secondary | ICD-10-CM | POA: Diagnosis not present

## 2023-06-30 DIAGNOSIS — E1129 Type 2 diabetes mellitus with other diabetic kidney complication: Secondary | ICD-10-CM | POA: Diagnosis not present

## 2023-07-05 DIAGNOSIS — E1129 Type 2 diabetes mellitus with other diabetic kidney complication: Secondary | ICD-10-CM | POA: Diagnosis not present

## 2023-07-05 DIAGNOSIS — F4489 Other dissociative and conversion disorders: Secondary | ICD-10-CM | POA: Diagnosis not present

## 2023-07-07 DIAGNOSIS — R634 Abnormal weight loss: Secondary | ICD-10-CM | POA: Diagnosis not present

## 2023-07-07 DIAGNOSIS — D509 Iron deficiency anemia, unspecified: Secondary | ICD-10-CM | POA: Diagnosis not present

## 2023-07-07 DIAGNOSIS — Z7409 Other reduced mobility: Secondary | ICD-10-CM | POA: Diagnosis not present

## 2023-07-07 DIAGNOSIS — E1129 Type 2 diabetes mellitus with other diabetic kidney complication: Secondary | ICD-10-CM | POA: Diagnosis not present

## 2023-07-07 DIAGNOSIS — E1122 Type 2 diabetes mellitus with diabetic chronic kidney disease: Secondary | ICD-10-CM | POA: Diagnosis not present

## 2023-07-07 DIAGNOSIS — I129 Hypertensive chronic kidney disease with stage 1 through stage 4 chronic kidney disease, or unspecified chronic kidney disease: Secondary | ICD-10-CM | POA: Diagnosis not present

## 2023-07-07 DIAGNOSIS — E1151 Type 2 diabetes mellitus with diabetic peripheral angiopathy without gangrene: Secondary | ICD-10-CM | POA: Diagnosis not present

## 2023-07-07 DIAGNOSIS — N1831 Chronic kidney disease, stage 3a: Secondary | ICD-10-CM | POA: Diagnosis not present

## 2023-07-07 DIAGNOSIS — F329 Major depressive disorder, single episode, unspecified: Secondary | ICD-10-CM | POA: Diagnosis not present

## 2023-07-07 DIAGNOSIS — R011 Cardiac murmur, unspecified: Secondary | ICD-10-CM | POA: Diagnosis not present

## 2023-07-07 DIAGNOSIS — I10 Essential (primary) hypertension: Secondary | ICD-10-CM | POA: Diagnosis not present

## 2023-07-07 DIAGNOSIS — E782 Mixed hyperlipidemia: Secondary | ICD-10-CM | POA: Diagnosis not present

## 2023-08-04 DIAGNOSIS — E1122 Type 2 diabetes mellitus with diabetic chronic kidney disease: Secondary | ICD-10-CM | POA: Diagnosis not present

## 2023-08-04 DIAGNOSIS — E1165 Type 2 diabetes mellitus with hyperglycemia: Secondary | ICD-10-CM | POA: Diagnosis not present

## 2023-08-04 DIAGNOSIS — Z79899 Other long term (current) drug therapy: Secondary | ICD-10-CM | POA: Diagnosis not present

## 2023-08-04 DIAGNOSIS — N390 Urinary tract infection, site not specified: Secondary | ICD-10-CM | POA: Diagnosis not present

## 2023-08-04 DIAGNOSIS — Z794 Long term (current) use of insulin: Secondary | ICD-10-CM | POA: Diagnosis not present

## 2023-08-04 DIAGNOSIS — Z7182 Exercise counseling: Secondary | ICD-10-CM | POA: Diagnosis not present

## 2023-08-04 DIAGNOSIS — I10 Essential (primary) hypertension: Secondary | ICD-10-CM | POA: Diagnosis not present

## 2023-08-04 DIAGNOSIS — E1129 Type 2 diabetes mellitus with other diabetic kidney complication: Secondary | ICD-10-CM | POA: Diagnosis not present

## 2023-08-04 DIAGNOSIS — Z713 Dietary counseling and surveillance: Secondary | ICD-10-CM | POA: Diagnosis not present

## 2023-08-18 DIAGNOSIS — Z79899 Other long term (current) drug therapy: Secondary | ICD-10-CM | POA: Diagnosis not present

## 2023-08-18 DIAGNOSIS — N390 Urinary tract infection, site not specified: Secondary | ICD-10-CM | POA: Diagnosis not present

## 2023-08-18 DIAGNOSIS — E1129 Type 2 diabetes mellitus with other diabetic kidney complication: Secondary | ICD-10-CM | POA: Diagnosis not present

## 2023-08-18 DIAGNOSIS — I10 Essential (primary) hypertension: Secondary | ICD-10-CM | POA: Diagnosis not present

## 2023-09-01 DIAGNOSIS — R300 Dysuria: Secondary | ICD-10-CM | POA: Diagnosis not present

## 2023-09-19 DIAGNOSIS — R32 Unspecified urinary incontinence: Secondary | ICD-10-CM | POA: Diagnosis not present

## 2023-09-19 DIAGNOSIS — M1990 Unspecified osteoarthritis, unspecified site: Secondary | ICD-10-CM | POA: Diagnosis not present

## 2023-09-19 DIAGNOSIS — N182 Chronic kidney disease, stage 2 (mild): Secondary | ICD-10-CM | POA: Diagnosis not present

## 2023-09-19 DIAGNOSIS — E785 Hyperlipidemia, unspecified: Secondary | ICD-10-CM | POA: Diagnosis not present

## 2023-09-19 DIAGNOSIS — E876 Hypokalemia: Secondary | ICD-10-CM | POA: Diagnosis not present

## 2023-09-19 DIAGNOSIS — F325 Major depressive disorder, single episode, in full remission: Secondary | ICD-10-CM | POA: Diagnosis not present

## 2023-09-19 DIAGNOSIS — J302 Other seasonal allergic rhinitis: Secondary | ICD-10-CM | POA: Diagnosis not present

## 2023-09-19 DIAGNOSIS — E669 Obesity, unspecified: Secondary | ICD-10-CM | POA: Diagnosis not present

## 2023-09-19 DIAGNOSIS — F419 Anxiety disorder, unspecified: Secondary | ICD-10-CM | POA: Diagnosis not present

## 2023-09-19 DIAGNOSIS — K219 Gastro-esophageal reflux disease without esophagitis: Secondary | ICD-10-CM | POA: Diagnosis not present

## 2023-09-19 DIAGNOSIS — I129 Hypertensive chronic kidney disease with stage 1 through stage 4 chronic kidney disease, or unspecified chronic kidney disease: Secondary | ICD-10-CM | POA: Diagnosis not present

## 2023-09-19 DIAGNOSIS — Z794 Long term (current) use of insulin: Secondary | ICD-10-CM | POA: Diagnosis not present

## 2023-09-26 ENCOUNTER — Telehealth: Payer: Self-pay

## 2023-09-26 DIAGNOSIS — E785 Hyperlipidemia, unspecified: Secondary | ICD-10-CM | POA: Insufficient documentation

## 2023-09-26 DIAGNOSIS — E119 Type 2 diabetes mellitus without complications: Secondary | ICD-10-CM | POA: Insufficient documentation

## 2023-09-26 DIAGNOSIS — G40909 Epilepsy, unspecified, not intractable, without status epilepticus: Secondary | ICD-10-CM | POA: Insufficient documentation

## 2023-09-26 DIAGNOSIS — N189 Chronic kidney disease, unspecified: Secondary | ICD-10-CM | POA: Insufficient documentation

## 2023-09-26 NOTE — Progress Notes (Deleted)
Name: Dana Ellis DOB: 09-09-1934 MRN: [id number]  History of Present Illness: Ms. Dana Ellis is a 89 y.o. female who presents today as a new patient at [facility]. All available relevant medical records have been reviewed. She is accompanied by ***.  She reports chief complaint of recurrent UTls.  Per scanned records from PCP: > 01/06/2023:  - Abnormal UA (large blood & large leukocytes; nitrite negative).  - Negative urine culture.  > 01/18/2023:  - Abnormal UA (large blood & large leukocytes; nitrite negative).  - Negative urine culture.  > 06/30/2023:  - Normal renal function (GFR 63; creatinine 0.89).  > 07/05/2023:  - Abnormal UA (large blood, large leukocytes, protein; nitrite negative).  - Treated with Cipro for suspected UTI. - Negative urine culture.  > 08/04/2023:  - UA showed moderate leukocytes, large blood, glucose, and protein. Was on Farxiga at that time (has since been discontinued). - Treated with Macrobid for suspected UTI.  > 08/18/2023:  - UA showed large leukocytes, blood, and protein.  - Urine culture + for >100k E. coli.  - Treated with Bactrim.  ***Per review of scanned records from Dr. Scharlene Gloss office, she has only had 1 positive urine culture in the past year (on 08/18/2023) and the rest were negative (on 01/06/23, 01/18/23, 07/05/23).  - If no urine culture performed on 08/04/2023 or if it was negative, then she does not meet rUTI criteria - does warrant eval for microhematuria based on prior negative urine cultures with abnormal UAs however (assuming that today's UA shows blood)  Urinary Symptoms: She reports *** episodes of UT-like sypmtoms in the past year. When present, UTI symptoms include dysuria, increased confusion, ***increased urinary urgency, ***frequency, ***. She reports that UTI symptoms do ***not seem to correlate with intercourse. She {Actions; denies-reports:120008} acute UTI symptoms today.  At baseline: She {Actions;  denies-reports:120008} urinary urgency, frequency, dysuria, gross hematuria, hesitancy, straining to void, or sensations of incomplete emptying. She reports voiding *** times per day and *** times at night. She {Actions; denies-reports:120008} pushing on a bulge in order to empty her bladder.  She {Actions; denies-reports:120008} urge incontinence. She {Actions; denies-reports:120008} stress incontinence with ***cough/***laugh/***sneeze/***heavy lifting/***exercise. She {Actions; denies-reports:120008} enuresis. She leaks *** times per ***. Wears *** ***pads / ***diapers per day. She states the ***SUI / ***UUI is predominant. This has been going on for {NUMBERS 1-12:18279} {days/wks/mos/yrs:310907} and {ACTION; IS/IS VQQ:[id number]} significantly bothersome. In terms of treatment, She has tried ***.  She {Actions; denies-reports:120008} caffeine intake.  She {Actions; denies-reports:120008} history of pyelonephritis.  She {Actions; denies-reports:120008} history of kidney stones.  Vaginal / prolapse Symptoms: She {Actions; denies-reports:120008} vaginal bulge sensation.  She {Actions; denies-reports:120008} seeing a vaginal bulge. This bulge is bothersome and is the size of ***. It was first noticed ***.  She {Actions; denies-reports:120008} vaginal pain, bleeding, or discharge.  She {Actions; denies-reports:120008} use of topical vaginal estrogen cream.  Bowel Symptoms: She has a continent bowel movement *** time(s) per ***. Typical Bristol stool scale of continent bowel episodes is Type ***. She {Actions; denies-reports:120008} Fl episodes. Fecal incontinence started *** and occurs *** times per week. Typical Bristol stool scale of incontinent bowel episodes is Type ***. She {Actions; denies-reports:120008} straining and {Actions; denies-reports:120008} splinting to defecate. She {Actions; denies-reports:120008} rectal bleeding. She {Actions; denies-reports:120008} feeling like she fully  empties her rectum with defecation. She {Actions; denies-reports:120008} urgency with defecation. She {Actions; denies-reports:120008} difficulty wiping clean. She {Actions; denies-reports:120008} taking laxatives, stool softeners, or fiber supplements. Last colonoscopy was***.  Past OB/GYN History: OB History   No obstetric history on file.    She {Actions; denies-reports:120008} being sexually active.  She {Actions; denies-reports:120008} dyspareunia. ***Dyspareunia is located ***at the introitus / ***deep inside the vagina. She has *** child(ren) who were delivered ***vaginally ***via c-section. She {Actions; denies-reports:120008} significant tearing with that ***delivery / those ***deliveries. She is {DESC; PRE/POST:32304} menopausal.  Last pap smear was ***. She {Actions; denies-reports:120008} history of *** hysterectomy.  Fall Screening: Do you usually have a device to assist in your mobility? {yes/no:20286} ***cane / ***walker / ***wheelchair   Medications: Current Outpatient Medications  Medication Sig Dispense Refill   acetaminophen (TYLENOL) 500 MG tablet Take 1,000 mg by mouth 2 (two) times daily. For pain     amLODipine (NORVASC) 2.5 MG tablet Take 2.5 mg by mouth daily.     aspirin EC 81 MG tablet Take 81 mg by mouth daily.      calcium-vitamin D (OSCAL WITH D) 500-200 MG-UNIT per tablet Take 1 tablet by mouth 2 (two) times daily.     cetirizine (ZYRTEC) 10 MG tablet Take 10 mg by mouth daily.      citalopram (CELEXA) 20 MG tablet Take 20 mg by mouth daily.      cloNIDine (CATAPRES) 0.3 MG tablet Take 0.3 mg by mouth 2 (two) times daily.     ferrous sulfate 325 (65 FE) MG tablet Take 325 mg by mouth daily with breakfast.     fluticasone (FLONASE) 50 MCG/ACT nasal spray Place 2 sprays into both nostrils daily.     furosemide (LASIX) 40 MG tablet Take 40 mg by mouth daily. *Take one tablet 30 minutes after taking Metolazone*     meclizine (ANTIVERT) 12.5 MG tablet Take  12.5 mg by mouth daily as needed.      meloxicam (MOBIC) 7.5 MG tablet Take 7.5 mg by mouth 2 (two) times daily.     metoprolol succinate (TOPROL-XL) 50 MG 24 hr tablet Take 50 mg by mouth daily. Take with or immediately following a meal.     Multiple Vitamin (MULTIVITAMIN WITH MINERALS) TABS Take 1 tablet by mouth daily.     olmesartan (BENICAR) 40 MG tablet Take 40 mg by mouth daily.      omeprazole (PRILOSEC) 20 MG capsule Take 1 capsule by mouth daily.     ondansetron (ZOFRAN ODT) 4 MG disintegrating tablet Take 1 tablet (4 mg total) by mouth every 8 (eight) hours as needed for nausea or vomiting. 20 tablet 0   potassium chloride SA (K-DUR,KLOR-CON) 20 MEQ tablet Take 20-40 mEq by mouth every morning. *Alternate taking one tablet daily and taking two tablets daily*     pravastatin (PRAVACHOL) 40 MG tablet Take 40 mg by mouth daily.      No current facility-administered medications for this visit.    Allergies: Allergies  Allergen Reactions   Ace Inhibitors Cough    Past Medical History:  Diagnosis Date   Arthritis    Hypertension    Past Surgical History:  Procedure Laterality Date   ABDOMINAL HYSTERECTOMY     JOINT REPLACEMENT     TOTAL KNEE ARTHROPLASTY     Family History  Problem Relation Age of Onset   CAD Mother    Arthritis Mother    CVA Father    Hypertension Father    Arthritis Brother    Social History   Socioeconomic History   Marital status: Divorced    Spouse name: Not on file   Number of children:  Not on file   Years of education: Not on file   Highest education level: Not on file  Occupational History   Not on file  Tobacco Use   Smoking status: Never   Smokeless tobacco: Never  Vaping Use   Vaping status: Never Used  Substance and Sexual Activity   Alcohol use: No   Drug use: No   Sexual activity: Yes    Birth control/protection: Surgical  Other Topics Concern   Not on file  Social History Narrative   Not on file   Social Determinants  of Health   Financial Resource Strain: Not on file  Food Insecurity: Not on file  Transportation Needs: Not on file  Physical Activity: Not on file  Stress: Not on file  Social Connections: Not on file  Intimate Partner Violence: Not on file    SUBJECTIVE  Review of Systems*** Constitutional: Patient denies any unintentional weight loss or change in strength lntegumentary: Patient denies any rashes or pruritus Eyes: Patient denies ***dry eyes ENT: Patient ***denies dry mouth Cardiovascular: Patient denies chest pain or syncope Respiratory: Patient denies shortness of breath Gastrointestinal: Patient ***denies nausea, vomiting, constipation, or diarrhea Musculoskeletal: Patient denies muscle cramps or weakness Neurologic: Patient denies convulsions or seizures Allergic/Immunologic: Patient denies recent allergic reaction(s) Hematologic/Lymphatic: Patient denies bleeding tendencies Endocrine: Patient denies heat/cold intolerance  GU: As per HPI.  OBJECTIVE There were no vitals filed for this visit. There is no height or weight on file to calculate BMI.  Physical Examination*** Constitutional: No obvious distress; patient is non-toxic appearing  Cardiovascular: No visible lower extremity edema.  Respiratory: The patient does not have audible wheezing/stridor; respirations do not appear labored  Gastrointestinal: Abdomen non-distended Musculoskeletal: Normal ROM of UEs  Skin: No obvious rashes/open sores  Neurologic: CN 2-12 grossly intact Psychiatric: Answered questions appropriately with normal affect  Hematologic/Lymphatic/Immunologic: No obvious bruises or sites of spontaneous bleeding  UA: ***negative *** WBC/hpf, *** RBC/hpf, bacteria (***) PVR: *** ml  ASSESSMENT No diagnosis found.  ***Recurrent UTls / ***History of UTls with insufficient urine culture data to formally validate recurrent UTI diagnosis: *** 2. ***Vaginal atrophy: ***  Will plan for follow up in  *** months or sooner if needed. Pt verbalized understanding and agreement. All questions were answered.  PLAN Advised the following: 1. *** 2. ***No follow-ups on file.  No orders of the defined types were placed in this encounter.   It has been explained that the patient is to follow regularly with their PCP in addition to all other providers involved in their care and to follow instructions provided by these respective offices. Patient advised to contact urology clinic if any urologic-pertaining questions, concerns, new symptoms or problems arise in the interim period.  There are no Patient Instructions on file for this visit.  Electronically signed by:  Donnita Falls, MSN, FNP-C, CUNP 09/26/2023 10:10 AM

## 2023-09-26 NOTE — Telephone Encounter (Signed)
-----   Message from Donnita Falls sent at 09/26/2023 10:44 AM EDT ----- New pt coming Monday 10/14 for UTI eval. Per review of scanned records from Dr. Scharlene Gloss office, she has only had 1 positive urine culture in the past year (on 08/18/2023) and the rest were negative (on 01/06/23, 01/18/23, 07/05/23). Please ask Dr. Scharlene Gloss office if there was a urine culture performed on 08/04/2023; if so please fax [place] that lab record. Thanks!

## 2023-09-26 NOTE — Progress Notes (Signed)
Name: Dana Ellis DOB: [birth year] MRN: [id number]  History of Present Illness: Dana Ellis is a 89 y.o. female who presents today as a new patient at [facility] . All available relevant medical records have been reviewed. She is accompanied by her son Dana Ellis, who assisted with giving history due to patient's dementia.  She reports chief complaint of recurrent UTls.  Per scanned records from PCP: > 01/06/2023:  - Abnormal UA (large blood & large leukocytes; nitrite negative).  - Negative urine culture.  > 01/18/2023:  - Abnormal UA (large blood & large leukocytes; nitrite negative).  - Negative urine culture.  > 06/30/2023:  - Normal renal function (GFR 63; creatinine 0.89).  > 07/05/2023:  - Abnormal UA (large blood, large leukocytes, protein; nitrite negative).  - Treated with Cipro for suspected UTI. - Negative urine culture.  > 08/04/2023:  - UA showed moderate leukocytes, large blood, glucose, and protein. Was on Farxiga at that time (has since been discontinued). - Treated with Macrobid for suspected UTI.  > 08/18/2023:  - UA showed large leukocytes, blood, and protein.  - Urine culture + for >100k E. coli.  - Treated with Bactrim.  > 09/01/2023:  - Urine culture positive for Klebsiella pneumoniae (MDRO)  Urinary Symptoms: When present, UTI symptoms include dysuria, increased confusion, increased urinary urgency, frequency, fatigue. She denies acute UTI symptoms today.  At baseline: She denies urinary urgency, frequency, dysuria, gross hematuria, hesitancy, straining to void, or sensations of incomplete emptying.  She reports urge incontinence. She reports occasional stress incontinence with cough. She reports enuresis. She leaks at least once a day and wears 2-4 diapers per day. She states the UUI is predominant and is not significantly bothersome. Denies any prior attempted treatments for urinary incontinence.  She reports mild caffeine intake  (1 cup of coffee).  She denies history of pyelonephritis.  She denies history of kidney stones.  She reports history of TAH / BSO.  Fall Screening: Do you usually have a device to assist in your mobility? Yes - walker   Medications: Current Outpatient Medications  Medication Sig Dispense Refill   acetaminophen (TYLENOL) 500 MG tablet Take 1,000 mg by mouth 2 (two) times daily. For pain     amLODipine (NORVASC) 2.5 MG tablet Take 2.5 mg by mouth daily.     aspirin EC 81 MG tablet Take 81 mg by mouth daily.      calcium-vitamin D (OSCAL WITH D) 500-200 MG-UNIT per tablet Take 1 tablet by mouth 2 (two) times daily.     cetirizine (ZYRTEC) 10 MG tablet Take 10 mg by mouth daily.      citalopram (CELEXA) 20 MG tablet Take 20 mg by mouth daily.      cloNIDine (CATAPRES) 0.3 MG tablet Take 0.3 mg by mouth 2 (two) times daily.     conjugated estrogens (PREMARIN) vaginal cream Discard plastic applicator. Insert a blueberry size amount (approximately 1 gram) of cream on fingertip inside vagina at bedtime every night. 30 g 3   ferrous sulfate 325 (65 FE) MG tablet Take 325 mg by mouth daily with breakfast.     fluticasone (FLONASE) 50 MCG/ACT nasal spray Place 2 sprays into both nostrils daily.     furosemide (LASIX) 40 MG tablet Take 40 mg by mouth daily. *Take one tablet 30 minutes after taking Metolazone*     meclizine (ANTIVERT) 12.5 MG tablet Take 12.5 mg by mouth daily as needed.      meloxicam (MOBIC) 7.5  MG tablet Take 7.5 mg by mouth 2 (two) times daily.     metoprolol succinate (TOPROL-XL) 50 MG 24 hr tablet Take 50 mg by mouth daily. Take with or immediately following a meal.     Multiple Vitamin (MULTIVITAMIN WITH MINERALS) TABS Take 1 tablet by mouth daily.     olmesartan (BENICAR) 40 MG tablet Take 40 mg by mouth daily.      omeprazole (PRILOSEC) 20 MG capsule Take 1 capsule by mouth daily.     ondansetron (ZOFRAN ODT) 4 MG disintegrating tablet Take 1 tablet (4 mg total) by mouth  every 8 (eight) hours as needed for nausea or vomiting. 20 tablet 0   potassium chloride SA (K-DUR,KLOR-CON) 20 MEQ tablet Take 20-40 mEq by mouth every morning. *Alternate taking one tablet daily and taking two tablets daily*     pravastatin (PRAVACHOL) 40 MG tablet Take 40 mg by mouth daily.      No current facility-administered medications for this visit.    Allergies: Allergies  Allergen Reactions   Ace Inhibitors Cough    Past Medical History:  Diagnosis Date   Arthritis    Hypertension    Past Surgical History:  Procedure Laterality Date   ABDOMINAL HYSTERECTOMY     JOINT REPLACEMENT     TOTAL KNEE ARTHROPLASTY     Family History  Problem Relation Age of Onset   CAD Mother    Arthritis Mother    CVA Father    Hypertension Father    Arthritis Brother    Social History   Socioeconomic History   Marital status: Divorced    Spouse name: Not on file   Number of children: Not on file   Years of education: Not on file   Highest education level: Not on file  Occupational History   Not on file  Tobacco Use   Smoking status: Never   Smokeless tobacco: Never  Vaping Use   Vaping status: Never Used  Substance and Sexual Activity   Alcohol use: No   Drug use: No   Sexual activity: Not Currently    Birth control/protection: Surgical  Other Topics Concern   Not on file  Social History Narrative   Not on file   Social Determinants of Health   Financial Resource Strain: Not on file  Food Insecurity: Not on file  Transportation Needs: Not on file  Physical Activity: Not on file  Stress: Not on file  Social Connections: Not on file  Intimate Partner Violence: Not on file    SUBJECTIVE  Review of Systems Constitutional: Patient denies any unintentional weight loss or change in strength lntegumentary: Patient denies any rashes or pruritus Cardiovascular: Patient denies chest pain or syncope Respiratory: Patient denies shortness of breath Gastrointestinal:  Patient denies nausea, vomiting, constipation, or diarrhea Musculoskeletal: Patient denies muscle cramps or weakness Neurologic: Patient denies convulsions or seizures Allergic/Immunologic: Patient denies recent allergic reaction(s) Hematologic/Lymphatic: Patient denies bleeding tendencies Endocrine: Patient denies heat/cold intolerance  GU: As per HPI.  OBJECTIVE Vitals:   10/06/23 0946  BP: (!) 144/75  Pulse: 79  Temp: 98.6 F (37 C)   Body mass index is 27.81 kg/m.  Physical Examination Constitutional: No obvious distress; patient is non-toxic appearing  Cardiovascular: No visible lower extremity edema.  Respiratory: The patient does not have audible wheezing/stridor; respirations do not appear labored  Gastrointestinal: Abdomen non-distended Musculoskeletal: Normal ROM of UEs  Skin: No obvious rashes/open sores  Neurologic: CN 2-12 grossly intact Psychiatric: Answered questions appropriately with  normal affect  Hematologic/Lymphatic/Immunologic: No obvious bruises or sites of spontaneous bleeding  UA (collected at home): >30 WBC/hpf, 3-10 RBC/hpf, bacteria (many) PVR: 0 ml  ASSESSMENT Recurrent UTI - Plan: conjugated estrogens (PREMARIN) vaginal cream  Atrophic vaginitis - Plan: conjugated estrogens (PREMARIN) vaginal cream  Mixed stress and urge urinary incontinence - Plan: Urinalysis, Routine w reflex microscopic, BLADDER SCAN AMB NON-IMAGING  Multiple drug resistant organism (MDRO) culture positive  1. Asymptomatic bacteriuria today. UA today appears abnormal however patient is asymptomatic for UTI, therefore acute antibiotic treatment is not advised at this time. The rationale for this was discussed with the patient.  - We discussed the difference between asymptomatic bacteriuria versus symptoms suggestive of a possible UTI which may include: fever, confusion, malaise, increased fatigue, dysuria, acute increase in urinary frequency / urgency / urge  incontinence. - Patient was advised that positive urine cultures only warrant acute antibiotic treatment if UTI symptoms are present. - In the absence of active UTI symptoms, a positive urine culture is considered to represent colonization of the urinary tract which does not warrant acute antibiotic treatment. - We discussed antibiotic stewardship and goal to minimize risk for developing antibiotic resistance, particularly given history of prior UTI with a multi-drug resistant organism (MDRO) in September 2024.  2. Recurrent UTls:  We discussed the possible etiologies of recurrent UTls including ascending infection related to intercourse; vaginal atrophy; transmural infection that has been treated incompletely; urinary tract stones; incomplete bladder emptying with urinary stasis; kidney or bladder tumor; urethral diverticulum; and colonization of  vagina and urinary tract with pathologic, adherent organisms.   For UTI prevention we discussed options including: Adequate fluid intake (>1.5 liters/day) to flush out the urinary tract. - Go to the bathroom to urinate every 4-6 hours while awake to minimize urinary stasis / bacterial overgrowth in the bladder. - Proanthocyanidin (PAC) supplement 36 mg daily; must be soluble (insoluble form of PAC will be ineffective). Recommend Ellura. Vitamin C supplement Probiotic to maintain healthy vaginal microbiome - Topical vaginal estrogen for vaginal atrophy.   If recurrent UTls persist, may consider UTI prophylaxis with a daily low dose antibiotic in the future.  Advised pt not to use D-mannose for UTI prevention due to sugar content given patient's history of diabetes.  For management of acute UTI symptoms: - Patient was advised that if/when UTI-like symptoms occur they can call our office to speak with a nurse, who can place an order for urinalysis (with reflex to urine culture). They may then proceed to a [facility] to provide their urine  sample. This is required for evaluation in order for their Urology provider to make informed treatment recommendation(s), which may or may not include an antibiotic prescription. - Discussed option to take over-the-counter Pyridium (commonly known under the AZO brand name) for bladder pain. No more than 3 days at a time.  Patient ultimately decided to start topical vaginal estrogen cream and will consider OTC supplements for UTI prevention. Handout provided.  3. Vaginal atrophy.  The etiology and consequences of urogenital epithelial atrophy was explained to patient. The thinning of the epithelium of the urethra can contribute to urinary urgency and frequency syndromes. In addition, the normal bacterial flora that colonizes the perineum may contribute to UTI risk because the thin urethral epithelium allows the bacteria to become adherent and the change in vaginal pH can disrupt the vaginal / urethral microbiome and allow for bacterial overgrowth.  We discussed that vaginal atrophy was likely the cause for prior  episodes of LUTS earlier this year with positive urinalyses but negative urine cultures.  Patient was advised that: - Topical vaginal estrogen replacement will take about 3 months to restore the vaginal pH. - Topical vaginal estrogen replacement may sting/burn initially due to severe dryness, which will improve with ongoing treatment.  We discussed that there have been studies that evaluate use of low-dose intravaginal estrogen cream that shows minimal systemic absorption that is negligible after 3 weeks. There have been no studies indicating increased risk of contributing to breast cancer development or recurrence.  Patient elected to start topical vaginal estrogen cream; advised nightly use for the next 3 months.  4. OAB with urge incontinence. We discussed the symptoms of overactive bladder (OAB), which include urinary urgency, frequency, nocturia, with or without urge incontinence.  While we may not know the exact etiology of OAB, several risk factors can be identified.  - Her neurogenic risk factors for OAB-type symptoms including T2DM and dementia. - Likely exacerbated by diuretic use and caffeine intake.   We discussed the following management options in detail including potential benefits, risks, and side effects: Behavioral therapy: Decreasing bladder irritants (such as caffeine) Timed voiding Medication(s)  She decided to proceed with behavioral modifications including minimizing caffeine intake and timed voiding.  5. Stress urinary incontinence. Not significantly bothersome per patient.  Will plan for follow up in 3 months or sooner if needed. Pt verbalized understanding and agreement. All questions were answered.   PLAN 1. For recurrent UTI prevention: - Start topical vaginal estrogen cream. - Maintain adequate fluid intake daily. - Consider OTC supplements for UTI prevention. 2. For OAB with urge incontinence: - Minimize caffeine intake. - Timed voiding 3. Return in about 3 months (around 01/06/2024) for UA, PVR, & f/u with Evette Georges NP.  Orders Placed This Encounter  Procedures   Urinalysis, Routine w reflex microscopic   BLADDER SCAN AMB NON-IMAGING    It has been explained that the patient is to follow regularly with their PCP in addition to all other providers involved in their care and to follow instructions provided by these respective offices. Patient advised to contact urology clinic if any urologic-pertaining questions, concerns, new symptoms or problems arise in the interim period.  Patient Instructions  Recommendations regarding UTI prevention / management:  When UTI symptoms occur: Call urology office to request order for urine culture. We recommend waiting for urine culture result prior to use of any antibiotics.  For bladder pain/ burning with urination: Over the counter Pyridium (phenazopyridine) as needed (commonly known  under the "AZO" brand). No more than 3 days consecutively at a time due to risk for methemoglobinemia, liver function issues, and bone health damage with long term use of Pyridium.  Routine use for UTI prevention: - Topical vaginal estrogen for vaginal atrophy. Adequate fluid intake (>1.5 liters/day) to flush out the urinary tract. - Go to the bathroom to urinate every 4-6 hours while awake to minimize urinary stasis / bacterial overgrowth in the bladder. - Proanthocyanidin (PAC) supplement 36 mg daily; must be soluble (insoluble form of PAC will be ineffective). Recommended brand: Ellura. This is an over-the-counter supplement (often must be found/ purchased online) supplement derived from cranberries with concentrated active component: Proanthocyanidin (PAC) 36 mg daily. Decreases bacterial adherence to bladder lining. Not recommended for patients with interstitial cystitis due to acidity. - Vitamin C supplement to acidify urine to minimize bacterial growth. Not recommended for patients with interstitial cystitis due to acidity. - Probiotic to maintain healthy  vaginal microbiome to suppress bacteria at urethral opening. Brand recommendations: Feminine Balance (highest concentration of lactobacillus) or Hyperbiotic Pro 15.  Note for patients with diabetes: You may read about D-mannose powder for UTI prevention. That is an over the-counter supplement which decreases bacterial adherence to bladder lining. I would NOT advise that for you as a person with diabetes due to its sugar content.   Electronically signed by:  Donnita Falls, MSN, FNP-C, CUNP 10/06/2023 10:54 AM

## 2023-09-26 NOTE — Telephone Encounter (Signed)
Left vm for Dr. Marcelo Baldy nurse to return call to office.

## 2023-10-02 ENCOUNTER — Ambulatory Visit: Payer: Medicare HMO | Admitting: Urology

## 2023-10-06 ENCOUNTER — Encounter: Payer: Self-pay | Admitting: Urology

## 2023-10-06 ENCOUNTER — Ambulatory Visit: Payer: Medicare HMO | Admitting: Urology

## 2023-10-06 VITALS — BP 144/75 | HR 79 | Temp 98.6°F | Ht 64.0 in | Wt 162.0 lb

## 2023-10-06 DIAGNOSIS — N390 Urinary tract infection, site not specified: Secondary | ICD-10-CM | POA: Insufficient documentation

## 2023-10-06 DIAGNOSIS — Z87440 Personal history of urinary (tract) infections: Secondary | ICD-10-CM | POA: Diagnosis not present

## 2023-10-06 DIAGNOSIS — E1129 Type 2 diabetes mellitus with other diabetic kidney complication: Secondary | ICD-10-CM | POA: Diagnosis not present

## 2023-10-06 DIAGNOSIS — R3129 Other microscopic hematuria: Secondary | ICD-10-CM

## 2023-10-06 DIAGNOSIS — N952 Postmenopausal atrophic vaginitis: Secondary | ICD-10-CM | POA: Diagnosis not present

## 2023-10-06 DIAGNOSIS — E782 Mixed hyperlipidemia: Secondary | ICD-10-CM | POA: Diagnosis not present

## 2023-10-06 DIAGNOSIS — N3946 Mixed incontinence: Secondary | ICD-10-CM | POA: Diagnosis not present

## 2023-10-06 DIAGNOSIS — Z1624 Resistance to multiple antibiotics: Secondary | ICD-10-CM | POA: Diagnosis not present

## 2023-10-06 DIAGNOSIS — R8290 Unspecified abnormal findings in urine: Secondary | ICD-10-CM | POA: Diagnosis not present

## 2023-10-06 LAB — URINALYSIS, ROUTINE W REFLEX MICROSCOPIC
Bilirubin, UA: NEGATIVE
Glucose, UA: NEGATIVE
Ketones, UA: NEGATIVE
Nitrite, UA: NEGATIVE
Specific Gravity, UA: 1.015 (ref 1.005–1.030)
Urobilinogen, Ur: 0.2 mg/dL (ref 0.2–1.0)
pH, UA: 7.5 (ref 5.0–7.5)

## 2023-10-06 LAB — MICROSCOPIC EXAMINATION: WBC, UA: >30 /[HPF] — AB (ref 0–5)

## 2023-10-06 LAB — BLADDER SCAN AMB NON-IMAGING: Scan Result: 0

## 2023-10-06 MED ORDER — ESTROGENS CONJUGATED 0.625 MG/GM VA CREA
TOPICAL_CREAM | VAGINAL | 3 refills | Status: DC
Start: 2023-10-06 — End: 2023-11-14

## 2023-10-06 NOTE — Patient Instructions (Signed)
Recommendations regarding UTI prevention / management:  When UTI symptoms occur: Call urology office to request order for urine culture. We recommend waiting for urine culture result prior to use of any antibiotics.   For bladder pain/ burning with urination: Over the counter Pyridium (phenazopyridine) as needed (commonly known under the "AZO" brand). No more than 3 days consecutively at a time due to risk for methemoglobinemia, liver function issues, and bone health damage with long term use of Pyridium.  Routine use for UTI prevention: - Topical vaginal estrogen for vaginal atrophy. Adequate fluid intake {>1.5 liters/day) to flush out the urinary tract. - Go to the bathroom to urinate every 4-6 hours while awake to minimize urinary stasis / bacterial overgrowth in the bladder. - Proanthocyanidin (PAC) supplement 36 mg daily; must be soluble (insoluble form of PAC will be ineffective). Recommended brand: Ellura. This is an over-the-counter supplement (often must be found/ purchased online) supplement derived from cranberries with concentrated active component: Proanthocyanidin (PAC) 36 mg daily. Decreases bacterial adherence to bladder lining. Not recommended for patients with interstitial cystitis due to acidity. - Vitamin C supplement to acidify urine to minimize bacterial growth. Not recommended for patients with interstitial cystitis due to acidity. - Probiotic to maintain healthy vaginal microbiome to suppress bacteria at urethral opening. Brand recommendations: Feminine Balance (highest concentration of lactobacillus) or Hyperbiotic Pro 15.  Note for patients with diabetes: You may read about D-mannose powder for UTI prevention. That is an over the-counter supplement which decreases bacterial adherence to bladder lining. I would NOT advise that for you as a person with diabetes due to its sugar content.  

## 2023-10-06 NOTE — Progress Notes (Signed)
post void residual = 0 ml

## 2023-10-08 ENCOUNTER — Inpatient Hospital Stay (HOSPITAL_COMMUNITY)
Admission: EM | Admit: 2023-10-08 | Discharge: 2023-10-16 | DRG: 871 | Disposition: A | Discharge status: Skilled Nursing Facility | Payer: Medicare HMO | Attending: Internal Medicine | Admitting: Internal Medicine

## 2023-10-08 ENCOUNTER — Other Ambulatory Visit: Payer: Self-pay

## 2023-10-08 ENCOUNTER — Encounter (HOSPITAL_COMMUNITY): Payer: Self-pay

## 2023-10-08 ENCOUNTER — Emergency Department (HOSPITAL_COMMUNITY): Payer: Medicare HMO

## 2023-10-08 ENCOUNTER — Inpatient Hospital Stay (HOSPITAL_COMMUNITY): Payer: Medicare HMO

## 2023-10-08 DIAGNOSIS — E11649 Type 2 diabetes mellitus with hypoglycemia without coma: Secondary | ICD-10-CM | POA: Diagnosis not present

## 2023-10-08 DIAGNOSIS — E7800 Pure hypercholesterolemia, unspecified: Secondary | ICD-10-CM | POA: Diagnosis not present

## 2023-10-08 DIAGNOSIS — K219 Gastro-esophageal reflux disease without esophagitis: Secondary | ICD-10-CM | POA: Diagnosis present

## 2023-10-08 DIAGNOSIS — Z8261 Family history of arthritis: Secondary | ICD-10-CM

## 2023-10-08 DIAGNOSIS — Z90710 Acquired absence of both cervix and uterus: Secondary | ICD-10-CM | POA: Diagnosis not present

## 2023-10-08 DIAGNOSIS — R4182 Altered mental status, unspecified: Secondary | ICD-10-CM | POA: Diagnosis not present

## 2023-10-08 DIAGNOSIS — J3089 Other allergic rhinitis: Secondary | ICD-10-CM | POA: Diagnosis not present

## 2023-10-08 DIAGNOSIS — Z1624 Resistance to multiple antibiotics: Secondary | ICD-10-CM | POA: Diagnosis present

## 2023-10-08 DIAGNOSIS — F0390 Unspecified dementia without behavioral disturbance: Secondary | ICD-10-CM | POA: Diagnosis present

## 2023-10-08 DIAGNOSIS — I672 Cerebral atherosclerosis: Secondary | ICD-10-CM | POA: Diagnosis not present

## 2023-10-08 DIAGNOSIS — B9689 Other specified bacterial agents as the cause of diseases classified elsewhere: Secondary | ICD-10-CM | POA: Diagnosis not present

## 2023-10-08 DIAGNOSIS — I4891 Unspecified atrial fibrillation: Secondary | ICD-10-CM | POA: Diagnosis present

## 2023-10-08 DIAGNOSIS — G40909 Epilepsy, unspecified, not intractable, without status epilepticus: Secondary | ICD-10-CM | POA: Diagnosis not present

## 2023-10-08 DIAGNOSIS — R509 Fever, unspecified: Secondary | ICD-10-CM | POA: Diagnosis not present

## 2023-10-08 DIAGNOSIS — Z8249 Family history of ischemic heart disease and other diseases of the circulatory system: Secondary | ICD-10-CM | POA: Diagnosis not present

## 2023-10-08 DIAGNOSIS — E559 Vitamin D deficiency, unspecified: Secondary | ICD-10-CM | POA: Diagnosis not present

## 2023-10-08 DIAGNOSIS — E860 Dehydration: Secondary | ICD-10-CM | POA: Diagnosis present

## 2023-10-08 DIAGNOSIS — A419 Sepsis, unspecified organism: Principal | ICD-10-CM

## 2023-10-08 DIAGNOSIS — G9341 Metabolic encephalopathy: Secondary | ICD-10-CM | POA: Diagnosis not present

## 2023-10-08 DIAGNOSIS — E1165 Type 2 diabetes mellitus with hyperglycemia: Secondary | ICD-10-CM | POA: Diagnosis not present

## 2023-10-08 DIAGNOSIS — E119 Type 2 diabetes mellitus without complications: Secondary | ICD-10-CM | POA: Diagnosis not present

## 2023-10-08 DIAGNOSIS — R6520 Severe sepsis without septic shock: Secondary | ICD-10-CM | POA: Diagnosis not present

## 2023-10-08 DIAGNOSIS — I480 Paroxysmal atrial fibrillation: Secondary | ICD-10-CM | POA: Diagnosis present

## 2023-10-08 DIAGNOSIS — Z1152 Encounter for screening for COVID-19: Secondary | ICD-10-CM

## 2023-10-08 DIAGNOSIS — R001 Bradycardia, unspecified: Secondary | ICD-10-CM | POA: Diagnosis not present

## 2023-10-08 DIAGNOSIS — Z794 Long term (current) use of insulin: Secondary | ICD-10-CM

## 2023-10-08 DIAGNOSIS — Z87440 Personal history of urinary (tract) infections: Secondary | ICD-10-CM

## 2023-10-08 DIAGNOSIS — F339 Major depressive disorder, recurrent, unspecified: Secondary | ICD-10-CM | POA: Diagnosis not present

## 2023-10-08 DIAGNOSIS — E876 Hypokalemia: Secondary | ICD-10-CM

## 2023-10-08 DIAGNOSIS — E871 Hypo-osmolality and hyponatremia: Secondary | ICD-10-CM | POA: Diagnosis not present

## 2023-10-08 DIAGNOSIS — I10 Essential (primary) hypertension: Secondary | ICD-10-CM | POA: Diagnosis present

## 2023-10-08 DIAGNOSIS — M6281 Muscle weakness (generalized): Secondary | ICD-10-CM | POA: Diagnosis not present

## 2023-10-08 DIAGNOSIS — B961 Klebsiella pneumoniae [K. pneumoniae] as the cause of diseases classified elsewhere: Secondary | ICD-10-CM | POA: Diagnosis not present

## 2023-10-08 DIAGNOSIS — E872 Acidosis, unspecified: Secondary | ICD-10-CM | POA: Diagnosis not present

## 2023-10-08 DIAGNOSIS — Z823 Family history of stroke: Secondary | ICD-10-CM

## 2023-10-08 DIAGNOSIS — E161 Other hypoglycemia: Secondary | ICD-10-CM | POA: Diagnosis not present

## 2023-10-08 DIAGNOSIS — K449 Diaphragmatic hernia without obstruction or gangrene: Secondary | ICD-10-CM | POA: Diagnosis not present

## 2023-10-08 DIAGNOSIS — A4159 Other Gram-negative sepsis: Secondary | ICD-10-CM | POA: Diagnosis not present

## 2023-10-08 DIAGNOSIS — Z7982 Long term (current) use of aspirin: Secondary | ICD-10-CM | POA: Diagnosis not present

## 2023-10-08 DIAGNOSIS — N1831 Chronic kidney disease, stage 3a: Secondary | ICD-10-CM | POA: Diagnosis not present

## 2023-10-08 DIAGNOSIS — Z791 Long term (current) use of non-steroidal anti-inflammatories (NSAID): Secondary | ICD-10-CM

## 2023-10-08 DIAGNOSIS — R488 Other symbolic dysfunctions: Secondary | ICD-10-CM | POA: Diagnosis not present

## 2023-10-08 DIAGNOSIS — N390 Urinary tract infection, site not specified: Secondary | ICD-10-CM | POA: Diagnosis not present

## 2023-10-08 DIAGNOSIS — N952 Postmenopausal atrophic vaginitis: Secondary | ICD-10-CM | POA: Diagnosis not present

## 2023-10-08 DIAGNOSIS — I517 Cardiomegaly: Secondary | ICD-10-CM | POA: Diagnosis not present

## 2023-10-08 DIAGNOSIS — E782 Mixed hyperlipidemia: Secondary | ICD-10-CM | POA: Diagnosis not present

## 2023-10-08 DIAGNOSIS — N3946 Mixed incontinence: Secondary | ICD-10-CM | POA: Diagnosis not present

## 2023-10-08 DIAGNOSIS — Z79899 Other long term (current) drug therapy: Secondary | ICD-10-CM

## 2023-10-08 DIAGNOSIS — M1990 Unspecified osteoarthritis, unspecified site: Secondary | ICD-10-CM | POA: Diagnosis not present

## 2023-10-08 DIAGNOSIS — E042 Nontoxic multinodular goiter: Secondary | ICD-10-CM | POA: Diagnosis not present

## 2023-10-08 DIAGNOSIS — R262 Difficulty in walking, not elsewhere classified: Secondary | ICD-10-CM | POA: Diagnosis not present

## 2023-10-08 DIAGNOSIS — R531 Weakness: Secondary | ICD-10-CM | POA: Diagnosis not present

## 2023-10-08 LAB — COMPREHENSIVE METABOLIC PANEL
ALT: 38 U/L (ref 0–44)
AST: 51 U/L — ABNORMAL HIGH (ref 15–41)
Albumin: 2.8 g/dL — ABNORMAL LOW (ref 3.5–5.0)
Alkaline Phosphatase: 89 U/L (ref 38–126)
Anion gap: 11 (ref 5–15)
BUN: 23 mg/dL (ref 8–23)
CO2: 22 mmol/L (ref 22–32)
Calcium: 8.3 mg/dL — ABNORMAL LOW (ref 8.9–10.3)
Chloride: 98 mmol/L (ref 98–111)
Creatinine, Ser: 0.98 mg/dL (ref 0.44–1.00)
GFR, Estimated: 56 mL/min — ABNORMAL LOW (ref >60)
Glucose, Bld: 59 mg/dL — ABNORMAL LOW (ref 70–99)
Potassium: 2.5 mmol/L — CL (ref 3.5–5.1)
Sodium: 131 mmol/L — ABNORMAL LOW (ref 135–145)
Total Bilirubin: 0.7 mg/dL (ref 0.3–1.2)
Total Protein: 6.4 g/dL — ABNORMAL LOW (ref 6.5–8.1)

## 2023-10-08 LAB — GLUCOSE, CAPILLARY: Glucose-Capillary: 40 mg/dL — CL (ref 70–99)

## 2023-10-08 LAB — CBC WITH DIFFERENTIAL/PLATELET
Abs Immature Granulocytes: 1.5 10*3/uL — ABNORMAL HIGH (ref 0.00–0.07)
Band Neutrophils: 17 %
Basophils Absolute: 0 10*3/uL (ref 0.0–0.1)
Basophils Relative: 0 %
Eosinophils Absolute: 0 10*3/uL (ref 0.0–0.5)
Eosinophils Relative: 0 %
HCT: 31.9 % — ABNORMAL LOW (ref 36.0–46.0)
Hemoglobin: 10.4 g/dL — ABNORMAL LOW (ref 12.0–15.0)
Lymphocytes Relative: 4 %
Lymphs Abs: 0.6 10*3/uL — ABNORMAL LOW (ref 0.7–4.0)
MCH: 30.5 pg (ref 26.0–34.0)
MCHC: 32.6 g/dL (ref 30.0–36.0)
MCV: 93.5 fL (ref 80.0–100.0)
Metamyelocytes Relative: 11 %
Monocytes Absolute: 0.3 10*3/uL (ref 0.1–1.0)
Monocytes Relative: 2 %
Neutro Abs: 11.5 10*3/uL — ABNORMAL HIGH (ref 1.7–7.7)
Neutrophils Relative %: 66 %
Platelets: 141 10*3/uL — ABNORMAL LOW (ref 150–400)
RBC: 3.41 MIL/uL — ABNORMAL LOW (ref 3.87–5.11)
RDW: 15.4 % (ref 11.5–15.5)
WBC: 13.8 10*3/uL — ABNORMAL HIGH (ref 4.0–10.5)
nRBC: 0 % (ref 0.0–0.2)

## 2023-10-08 LAB — PROTIME-INR
INR: 1.3 — ABNORMAL HIGH (ref 0.8–1.2)
Prothrombin Time: 16.7 s — ABNORMAL HIGH (ref 11.4–15.2)

## 2023-10-08 LAB — MAGNESIUM: Magnesium: 1.2 mg/dL — ABNORMAL LOW (ref 1.7–2.4)

## 2023-10-08 LAB — CBG MONITORING, ED
Glucose-Capillary: 169 mg/dL — ABNORMAL HIGH (ref 70–99)
Glucose-Capillary: 49 mg/dL — ABNORMAL LOW (ref 70–99)

## 2023-10-08 LAB — LACTIC ACID, PLASMA
Lactic Acid, Venous: 2.2 mmol/L (ref 0.5–1.9)
Lactic Acid, Venous: 2.6 mmol/L (ref 0.5–1.9)
Lactic Acid, Venous: 1.5 mmol/L (ref 0.5–1.9)

## 2023-10-08 LAB — RESP PANEL BY RT-PCR (RSV, FLU A&B, COVID)  RVPGX2
Influenza A by PCR: NEGATIVE
Influenza B by PCR: NEGATIVE
Resp Syncytial Virus by PCR: NEGATIVE
SARS Coronavirus 2 by RT PCR: NEGATIVE

## 2023-10-08 LAB — APTT: aPTT: 32 s (ref 24–36)

## 2023-10-08 MED ORDER — SODIUM CHLORIDE 0.9 % IV BOLUS
500.0000 mL | Freq: Once | INTRAVENOUS | Status: AC
  Administered 2023-10-08: 500 mL via INTRAVENOUS

## 2023-10-08 MED ORDER — ONDANSETRON HCL 4 MG/2ML IJ SOLN
4.0000 mg | Freq: Four times a day (QID) | INTRAMUSCULAR | Status: DC | PRN
  Administered 2023-10-09: 4 mg via INTRAVENOUS
  Filled 2023-10-08: qty 2

## 2023-10-08 MED ORDER — ACETAMINOPHEN 325 MG PO TABS
650.0000 mg | ORAL_TABLET | Freq: Once | ORAL | Status: AC
  Administered 2023-10-08: 650 mg via ORAL
  Filled 2023-10-08: qty 2

## 2023-10-08 MED ORDER — POLYETHYLENE GLYCOL 3350 17 G PO PACK
17.0000 g | PACK | Freq: Every day | ORAL | Status: DC | PRN
Start: 2023-10-08 — End: 2023-10-16

## 2023-10-08 MED ORDER — DEXTROSE-SODIUM CHLORIDE 5-0.9 % IV SOLN: INTRAVENOUS | Status: DC

## 2023-10-08 MED ORDER — DEXTROSE 50 % IV SOLN
1.0000 | Freq: Once | INTRAVENOUS | Status: AC
  Administered 2023-10-08: 50 mL via INTRAVENOUS
  Filled 2023-10-08: qty 50

## 2023-10-08 MED ORDER — SODIUM CHLORIDE 0.9 % IV SOLN
1.0000 g | Freq: Two times a day (BID) | INTRAVENOUS | Status: DC
Start: 2023-10-08 — End: 2023-10-13
  Administered 2023-10-08 – 2023-10-13 (×10): 1 g via INTRAVENOUS
  Filled 2023-10-08 (×10): qty 20

## 2023-10-08 MED ORDER — MAGNESIUM SULFATE 2 GM/50ML IV SOLN
2.0000 g | INTRAVENOUS | Status: AC
  Administered 2023-10-08: 2 g via INTRAVENOUS
  Filled 2023-10-08: qty 50

## 2023-10-08 MED ORDER — PANTOPRAZOLE SODIUM 40 MG PO TBEC
40.0000 mg | DELAYED_RELEASE_TABLET | Freq: Every day | ORAL | Status: DC
  Administered 2023-10-08 – 2023-10-16 (×9): 40 mg via ORAL
  Filled 2023-10-08 (×9): qty 1

## 2023-10-08 MED ORDER — ONDANSETRON HCL 4 MG PO TABS: 4.0000 mg | ORAL_TABLET | Freq: Four times a day (QID) | ORAL | Status: DC | PRN

## 2023-10-08 MED ORDER — ACETAMINOPHEN 650 MG RE SUPP: 650.0000 mg | Freq: Four times a day (QID) | RECTAL | Status: DC | PRN

## 2023-10-08 MED ORDER — CITALOPRAM HYDROBROMIDE 20 MG PO TABS
20.0000 mg | ORAL_TABLET | Freq: Every day | ORAL | Status: DC
  Administered 2023-10-08 – 2023-10-16 (×9): 20 mg via ORAL
  Filled 2023-10-08 (×9): qty 1

## 2023-10-08 MED ORDER — ACETAMINOPHEN 325 MG PO TABS
650.0000 mg | ORAL_TABLET | Freq: Four times a day (QID) | ORAL | Status: DC | PRN
  Administered 2023-10-14 – 2023-10-15 (×2): 650 mg via ORAL
  Filled 2023-10-08 (×3): qty 2

## 2023-10-08 MED ORDER — POTASSIUM CHLORIDE 10 MEQ/100ML IV SOLN
10.0000 meq | INTRAVENOUS | Status: AC
  Administered 2023-10-08 – 2023-10-09 (×2): 10 meq via INTRAVENOUS
  Filled 2023-10-08: qty 100

## 2023-10-08 MED ORDER — ENOXAPARIN SODIUM 40 MG/0.4ML IJ SOSY
40.0000 mg | PREFILLED_SYRINGE | INTRAMUSCULAR | Status: DC
Start: 2023-10-08 — End: 2023-10-16
  Administered 2023-10-08 – 2023-10-15 (×8): 40 mg via SUBCUTANEOUS
  Filled 2023-10-08 (×8): qty 0.4

## 2023-10-08 MED ORDER — POTASSIUM CHLORIDE 10 MEQ/100ML IV SOLN: 10.0000 meq | Freq: Once | INTRAVENOUS | Status: DC

## 2023-10-08 MED ORDER — KCL IN DEXTROSE-NACL 40-5-0.9 MEQ/L-%-% IV SOLN: INTRAVENOUS | Status: AC

## 2023-10-08 MED ORDER — SODIUM CHLORIDE 0.9 % IV SOLN
2.0000 g | INTRAVENOUS | Status: DC
  Administered 2023-10-08: 2 g via INTRAVENOUS
  Filled 2023-10-08: qty 20

## 2023-10-08 MED ORDER — POTASSIUM CHLORIDE 20 MEQ PO PACK
40.0000 meq | PACK | ORAL | Status: AC
  Administered 2023-10-08 (×2): 40 meq via ORAL
  Filled 2023-10-08 (×2): qty 2

## 2023-10-08 NOTE — Assessment & Plan Note (Addendum)
Continue close monitoring of glucose. This am fasting glucose was 103 mg/dl.

## 2023-10-08 NOTE — H&P (Signed)
History and Physical    BREINDEL HUCK ZOX:[id number] DOB: 01-15-1934 DOA: 10/08/2023  PCP: Benita Stabile, MD   Patient coming from: Home  I have personally briefly reviewed patient's old medical records in [facility]  Chief Complaint: AMS, weakness  HPI: Dana Ellis is a 89 y.o. female with medical history significant for hypertension, diabetes mellitus, depression, dementia. Patient was brought to the ED from home with reports of altered mental status and low blood glucose, also reported new diagnosis of early dementia. At the time of my evaluation patient is awake and alert, oriented to person only, able to answer simple questions.  Patient's son who lives close to patient, but the patient has been confused, talking " nonsense, and weak.  She has a history of recurrent urinary tract infections.  This morning patient's son reported that patient usually gets up at 9:30 in the morning, but today she was in bed with rigors and she was confused and too weak to get up.   Patient denies cough, difficulty breathing, no abdominal pain, no vomiting or diarrhea.  She denies falls. Reports blood sugar- 47.   ED Course: Febrile to 101.3, heart rate 89, respirate rate 16- 32, blood pressure 117-123 systolic.  O2 sats 98% on room air.  Hypokalemia potassium 2.5. Leukocytosis of 13.8. EKG shows atrial fibrillation-rate 87. CBG 59. X-ray without active disease. D50 x 1 given.  IV meropenem 1 g given after consultation with pharmacy.   Review of Systems: As per HPI all other systems reviewed and negative.  Past Medical History:  Diagnosis Date   Arthritis    Hypertension     Past Surgical History:  Procedure Laterality Date   ABDOMINAL HYSTERECTOMY     JOINT REPLACEMENT     TOTAL KNEE ARTHROPLASTY       reports that she has never smoked. She has never used smokeless tobacco. She reports that she does not drink alcohol and does not use drugs.  Allergies  Allergen Reactions    Ace Inhibitors Cough    Family History  Problem Relation Age of Onset   CAD Mother    Arthritis Mother    CVA Father    Hypertension Father    Arthritis Brother     Prior to Admission medications   Medication Sig Start Date End Date Taking? Authorizing Provider  TRESIBA FLEXTOUCH 100 UNIT/ML FlexTouch Pen Inject 10-20 Units into the skin daily. 09/27/23  Yes [provider]  Vitamin D, Ergocalciferol, (DRISDOL) 1.25 MG (50000 UNIT) CAPS capsule Take 50,000 Units by mouth once a week. 10/02/23  Yes [provider]  acetaminophen (TYLENOL) 500 MG tablet Take 1,000 mg by mouth 2 (two) times daily. For pain    [provider]  amLODipine (NORVASC) 2.5 MG tablet Take 2.5 mg by mouth daily.    [provider]  aspirin EC 81 MG tablet Take 81 mg by mouth daily.     [provider]  calcium-vitamin D (OSCAL WITH D) 500-200 MG-UNIT per tablet Take 1 tablet by mouth 2 (two) times daily.    [provider]  cetirizine (ZYRTEC) 10 MG tablet Take 10 mg by mouth daily.     [provider]  citalopram (CELEXA) 20 MG tablet Take 20 mg by mouth daily.     [provider]  cloNIDine (CATAPRES) 0.3 MG tablet Take 0.3 mg by mouth 2 (two) times daily. 06/06/19   [provider]  conjugated estrogens (PREMARIN) vaginal cream Discard  plastic applicator. Insert a blueberry size amount (approximately 1 gram) of cream on fingertip inside vagina at bedtime every night. 10/06/23   Donnita Falls, FNP  ferrous sulfate 325 (65 FE) MG tablet Take 325 mg by mouth daily with breakfast.    [provider]  fluticasone (FLONASE) 50 MCG/ACT nasal spray Place 2 sprays into both nostrils daily. 06/03/19   [provider]  furosemide (LASIX) 40 MG tablet Take 40 mg by mouth daily. *Take one tablet 30 minutes after taking Metolazone*    [provider]  meclizine (ANTIVERT) 12.5 MG tablet Take 12.5 mg by mouth daily as  needed.     [provider]  meloxicam (MOBIC) 7.5 MG tablet Take 7.5 mg by mouth 2 (two) times daily. 01/12/17   [provider]  metoprolol succinate (TOPROL-XL) 50 MG 24 hr tablet Take 50 mg by mouth daily. Take with or immediately following a meal.    [provider]  Multiple Vitamin (MULTIVITAMIN WITH MINERALS) TABS Take 1 tablet by mouth daily.    [provider]  olmesartan (BENICAR) 40 MG tablet Take 40 mg by mouth daily.     [provider]  omeprazole (PRILOSEC) 20 MG capsule Take 1 capsule by mouth daily. 06/13/19   [provider]  ondansetron (ZOFRAN ODT) 4 MG disintegrating tablet Take 1 tablet (4 mg total) by mouth every 8 (eight) hours as needed for nausea or vomiting. 06/25/19   Caccavale, Sophia, PA-C  potassium chloride SA (K-DUR,KLOR-CON) 20 MEQ tablet Take 20-40 mEq by mouth every morning. *Alternate taking one tablet daily and taking two tablets daily*    [provider]  pravastatin (PRAVACHOL) 40 MG tablet Take 40 mg by mouth daily.     [provider]    Physical Exam: Vitals:   10/08/23 1539 10/08/23 1541  BP: 117/66   Pulse: 89   Resp: 16   Temp: (!) 101.3 F (38.5 C)   TempSrc: Rectal   SpO2: 98%   Weight:  73.4 kg  Height:  5\' 4"  (1.626 m)    Constitutional: NAD, calm, comfortable Vitals:   10/08/23 1539 10/08/23 1541  BP: 117/66   Pulse: 89   Resp: 16   Temp: (!) 101.3 F (38.5 C)   TempSrc: Rectal   SpO2: 98%   Weight:  73.4 kg  Height:  5\' 4"  (1.626 m)   Eyes: PERRL, lids and conjunctivae normal ENMT: Mucous membranes are markedly dry Neck: normal, supple, no masses, no thyromegaly Respiratory: clear to auscultation bilaterally, no wheezing, no crackles. Normal respiratory effort. No accessory muscle use.  Cardiovascular: Regular rate and rhythm, no murmurs / rubs / gallops. Trace edema right lower extremity Abdomen: no tenderness, no masses palpated. No hepatosplenomegaly.  Bowel sounds positive.  Musculoskeletal: no clubbing / cyanosis. No joint deformity upper and lower extremities.  Skin: no rashes, lesions, ulcers. No induration Neurologic: No facial asymmetry, moving extremities spontaneously Psychiatric: Awake alert oriented to person only.    Labs on Admission: I have personally reviewed following labs and imaging studies  CBC: Recent Labs  Lab 10/08/23 1614  WBC 13.8*  NEUTROABS 11.5*  HGB 10.4*  HCT 31.9*  MCV 93.5  PLT 141*   Basic Metabolic Panel: Recent Labs  Lab 10/08/23 1614  NA 131*  K 2.5*  CL 98  CO2 22  GLUCOSE 59*  BUN 23  CREATININE 0.98  CALCIUM 8.3*   GFR: Estimated Creatinine Clearance: 39.7 mL/min (by C-G formula based on  SCr of 0.98 mg/dL). Liver Function Tests: Recent Labs  Lab 10/08/23 1614  AST 51*  ALT 38  ALKPHOS 89  BILITOT 0.7  PROT 6.4*  ALBUMIN 2.8*   Coagulation Profile: Recent Labs  Lab 10/08/23 1614  INR 1.3*   CBG: Recent Labs  Lab 10/08/23 1535 10/08/23 1626  GLUCAP 49* 169*   Radiological Exams on Admission: No results found.  EKG: Independently reviewed.   Assessment/Plan Principal Problem:   Complicated UTI (urinary tract infection) Active Problems:   Hypokalemia   Severe sepsis (HCC)   Acute metabolic encephalopathy   Hypoglycemia associated with diabetes (HCC)   Multiple drug resistant organism (MDRO) culture positive   Atrial fibrillation (HCC)   Essential hypertension   T2DM (type 2 diabetes mellitus) (HCC)  Assessment and Plan: * Complicated UTI (urinary tract infection) Complicated UTI with severe sepsis.  UA done 2 days ago 10/18 by urologist office, showed positive leukocytes, and many bacteria, deemed asymptomatic bacteriuria as patient had no symptoms.  Urine cultures 9/15 grew multidrug-resistant Klebsiella. -IV meropenem started in ED, after consultation with pharmacy -Follow-up urine cultures  Hypoglycemia associated with diabetes (HCC) Blood sugars  down to 59 in the ED.  Hypoglycemia in the setting of severe sepsis, on insulin- on Tresiba 10 to 14 units daily.  -Hold home insulin - HgbA1c -CBG Q4 hourly -D5 containing fluids  Acute metabolic encephalopathy Reported confused speech, weakness.  Recent diagnosis of early dementia.  At the time of my evaluation she is awake alert oriented to person only.  Encephalopathy likely secondary to UTI, severe sepsis and dehydration. -Obtain head CT - Hydrate, IV antibiotics  Severe sepsis (HCC) Meeting severe sepsis criteria with fever of 101.3, leukocytosis of 13.8, with evidence of endorgan dysfunction, lactic acidosis of 2.2 > 2.6.  Severe sepsis likely secondary to complicated UTI with history of multidrug-resistant bacteria.  UA suggestive of UTI.  Chest x-ray negative for acute abnormality. -1 L bolus, continue  D5 N/s + 40kcl 75cc/hr x 12hrs - Trend lactic acid -UA urine cultures -Follow-up blood cultures   Hypokalemia Potassium 2.5, magnesium 1.2. -Replete IV and oral  Atrial fibrillation (HCC) EKG currently showing atrial fibrillation.  Rate 87, in the setting of severe sepsis.  Not on anticoagulation. -On anticoagulation for now.  Essential hypertension Soft.  Multiple blood pressure medications. -Hold Norvasc 5, clonidine 0.1 twice daily   DVT prophylaxis: Eliquis Code Status: FULL code Family Communication: None At bedside. Son Peyton Najjar previously present. Disposition Plan:  > 2 days Consults called: None  Admission status: Inpt Tele I certify that at the point of admission it is my clinical judgment that the patient will require inpatient hospital care spanning beyond 2 midnights from the point of admission due to high intensity of service, high risk for further deterioration and high frequency of surveillance required.     Author: Onnie Boer, MD 10/08/2023 7:45 PM  For on call review [email or web address].

## 2023-10-08 NOTE — Assessment & Plan Note (Deleted)
Potassium 2.5, magnesium 1.2. -Replete IV and oral

## 2023-10-08 NOTE — ED Triage Notes (Signed)
Pt arrived [facility] from home. Pt lives alone and CBG was 75. EMS gave oral glucose 15 gm and pt voiced she only ate 2 crackers today. [facility] rechecked CBG 60. Oral temp 101.0 . Pt denies n/v d nut hx of UTI'S. Pt can ambulate with walker per [facility]. Son lives close to pt and checks on her.

## 2023-10-08 NOTE — Assessment & Plan Note (Addendum)
Clinically has resolved.

## 2023-10-08 NOTE — Assessment & Plan Note (Deleted)
_ HgbA1c 

## 2023-10-08 NOTE — Progress Notes (Signed)
CODE SEPSIS - PHARMACY COMMUNICATION  **Broad Spectrum Antibiotics should be administered within 1 hour of Sepsis diagnosis**  Time Code Sepsis Called/Page Received: 1543  Antibiotics Ordered: ceftriaxone  Time of 1st antibiotic administration: 1611  Additional action taken by pharmacy: none  If necessary, Name of Provider/Nurse Contacted: n/a    Elliot Gurney, PharmD, BCPS Clinical Pharmacist  10/08/2023 3:43 PM

## 2023-10-08 NOTE — Assessment & Plan Note (Addendum)
Complicated UTI with severe sepsis.  Urine culture positive for Klebsiella  Blood culture positive for enterobacter and citrobacter.   Plan  for antibiotic therapy with meropenem of 5 days IV and then 3 days of bactrim.  Patient very weak and deconditioned, plan for transfer to SNF for recovery.

## 2023-10-08 NOTE — Assessment & Plan Note (Addendum)
Has been rate control.  Not on anticoagulation for now.

## 2023-10-08 NOTE — Assessment & Plan Note (Addendum)
Systolic blood pressure 116, 192 to 172  Will resume amlodipine for blood pressure control, continue holding on clonidine for now.

## 2023-10-08 NOTE — Sepsis Progress Note (Signed)
Sepsis protocol is being followed by [facility]. 

## 2023-10-08 NOTE — ED Provider Notes (Signed)
[place] EMERGENCY DEPARTMENT AT [facility] Provider Note   CSN: [id number] Arrival date & time: 10/08/23  1507     History  Chief Complaint  Patient presents with   Hypoglycemia    Dana Ellis is a 89 y.o. female.   Hypoglycemia  This patient is an 89 year old female who presents from home after the paramedics were called because of some altered mental status as well as some recurrent hypoglycemia.  The patient is an insulin requiring diabetic, also treated for hypertension and hypercholesterolemia.  She was recently diagnosed with some early dementia but is also recently started to struggle with recurrent urinary tract infections.  She has seen the urologist as recently as 2 days ago for further evaluation, it is unclear whether she is on an antibiotic at this time.  Review of the medical record shows that she had urinary infections twice in January, twice in July, twice in August and once in September the most recent 1 coming back positive for multidrug-resistant Klebsiella pneumonia the urinalysis that was collected at that time showed greater than 30 white blood cells and many bacteria, was considered to be "asymptomatic bacteriuria" at that visit 2 days ago  The patient was found to be febrile and hypoglycemic this morning with a sugar as low as 47 and a temperature of 101 degrees by paramedics.  She has been altered and "talking nonsense" according to her son who I spoke with on the phone.  The son reports that she is usually out of bed by 9:30 in the morning walking around with her walker, today she was in bed with rigors, chills and confusion and too weak to get up by herself    Home Medications Prior to Admission medications   Medication Sig Start Date End Date Taking? Authorizing Provider  acetaminophen (TYLENOL) 500 MG tablet Take 1,000 mg by mouth 2 (two) times daily. For pain    [provider]  amLODipine (NORVASC) 2.5 MG tablet Take 2.5 mg by  mouth daily.    [provider]  aspirin EC 81 MG tablet Take 81 mg by mouth daily.     [provider]  calcium-vitamin D (OSCAL WITH D) 500-200 MG-UNIT per tablet Take 1 tablet by mouth 2 (two) times daily.    [provider]  cetirizine (ZYRTEC) 10 MG tablet Take 10 mg by mouth daily.     [provider]  citalopram (CELEXA) 20 MG tablet Take 20 mg by mouth daily.     [provider]  cloNIDine (CATAPRES) 0.3 MG tablet Take 0.3 mg by mouth 2 (two) times daily. 06/06/19   [provider]  conjugated estrogens (PREMARIN) vaginal cream Discard plastic applicator. Insert a blueberry size amount (approximately 1 gram) of cream on fingertip inside vagina at bedtime every night. 10/06/23   Donnita Falls, FNP  ferrous sulfate 325 (65 FE) MG tablet Take 325 mg by mouth daily with breakfast.    [provider]  fluticasone (FLONASE) 50 MCG/ACT nasal spray Place 2 sprays into both nostrils daily. 06/03/19   [provider]  furosemide (LASIX) 40 MG tablet Take 40 mg by mouth daily. *Take one tablet 30 minutes after taking Metolazone*    [provider]  meclizine (ANTIVERT) 12.5 MG tablet Take 12.5 mg by mouth daily as needed.     [provider]  meloxicam (MOBIC) 7.5 MG tablet Take 7.5 mg by mouth 2 (two) times daily. 01/12/17   [provider]  metoprolol succinate (TOPROL-XL) 50 MG 24 hr tablet Take 50 mg by mouth daily. Take with or immediately following a meal.    [provider]  Multiple Vitamin (MULTIVITAMIN WITH MINERALS) TABS Take 1 tablet by mouth daily.    [provider]  olmesartan (BENICAR) 40 MG tablet Take 40 mg by mouth daily.     [provider]  omeprazole (PRILOSEC) 20 MG capsule Take 1 capsule by mouth daily. 06/13/19   [provider]  ondansetron (ZOFRAN ODT) 4 MG disintegrating tablet Take 1 tablet (4 mg total) by mouth every 8 (eight) hours as needed  for nausea or vomiting. 06/25/19   Caccavale, Sophia, PA-C  potassium chloride SA (K-DUR,KLOR-CON) 20 MEQ tablet Take 20-40 mEq by mouth every morning. *Alternate taking one tablet daily and taking two tablets daily*    [provider]  pravastatin (PRAVACHOL) 40 MG tablet Take 40 mg by mouth daily.     [provider]      Allergies    Ace inhibitors    Review of Systems   Review of Systems  All other systems reviewed and are negative.   Physical Exam Updated Vital Signs BP 117/66 (BP Location: Left Arm)   Pulse 89   Temp (!) 101.3 F (38.5 C) (Rectal)   Resp 16   Ht 1.626 m (5\' 4" )   Wt 73.4 kg   SpO2 98%   BMI 27.79 kg/m  Physical Exam Vitals and nursing note reviewed.  Constitutional:      General: She is not in acute distress.    Appearance: She is well-developed.  HENT:     Head: Normocephalic and atraumatic.     Mouth/Throat:     Mouth: Mucous membranes are dry.     Pharynx: No oropharyngeal exudate.  Eyes:     General: No scleral icterus.       Right eye: No discharge.        Left eye: No discharge.     Conjunctiva/sclera: Conjunctivae normal.     Pupils: Pupils are equal, round, and reactive to light.  Neck:     Thyroid: No thyromegaly.     Vascular: No JVD.  Cardiovascular:     Rate and Rhythm: Regular rhythm. Tachycardia present.     Heart sounds: Normal heart sounds. No murmur heard.    No friction rub. No gallop.     Comments: Borderline tachycardia rate around 95 to 100 bpm Pulmonary:     Effort: Pulmonary effort is normal. No respiratory distress.     Breath sounds: Normal breath sounds. No wheezing or rales.  Abdominal:     General: Bowel sounds are normal. There is no distension.     Palpations: Abdomen is soft. There is no mass.     Tenderness: There is no abdominal tenderness.  Musculoskeletal:        General: No tenderness. Normal range of motion.     Cervical back: Normal range of motion and neck supple.     Right lower  leg: No edema.     Left lower leg: No edema.  Lymphadenopathy:     Cervical: No cervical adenopathy.  Skin:    General: Skin is warm and dry.     Findings: No erythema or rash.  Neurological:     Mental Status: She is alert.     Coordination: Coordination normal.     Comments: Pleasantly confused, follows commands, able to roll on her side, extremely weak but able with  assistance to sit up  Psychiatric:        Behavior: Behavior normal.     ED Results / Procedures / Treatments   Labs (all labs ordered are listed, but only abnormal results are displayed) Labs Reviewed  LACTIC ACID, PLASMA - Abnormal; Notable for the following components:      Result Value   Lactic Acid, Venous 2.2 (*)    All other components within normal limits  COMPREHENSIVE METABOLIC PANEL - Abnormal; Notable for the following components:   Sodium 131 (*)    Potassium 2.5 (*)    Glucose, Bld 59 (*)    Calcium 8.3 (*)    Total Protein 6.4 (*)    Albumin 2.8 (*)    AST 51 (*)    GFR, Estimated 56 (*)    All other components within normal limits  CBC WITH DIFFERENTIAL/PLATELET - Abnormal; Notable for the following components:   WBC 13.8 (*)    RBC 3.41 (*)    Hemoglobin 10.4 (*)    HCT 31.9 (*)    Platelets 141 (*)    Neutro Abs 11.5 (*)    Lymphs Abs 0.6 (*)    Abs Immature Granulocytes 1.50 (*)    All other components within normal limits  PROTIME-INR - Abnormal; Notable for the following components:   Prothrombin Time 16.7 (*)    INR 1.3 (*)    All other components within normal limits  CBG MONITORING, ED - Abnormal; Notable for the following components:   Glucose-Capillary 49 (*)    All other components within normal limits  CBG MONITORING, ED - Abnormal; Notable for the following components:   Glucose-Capillary 169 (*)    All other components within normal limits  RESP PANEL BY RT-PCR (RSV, FLU A&B, COVID)  RVPGX2  CULTURE, BLOOD (ROUTINE X 2)  CULTURE, BLOOD (ROUTINE X 2)  APTT  LACTIC  ACID, PLASMA  URINALYSIS, W/ REFLEX TO CULTURE (INFECTION SUSPECTED)    EKG EKG Interpretation Date/Time:  Sunday October 08 2023 15:29:05 EDT Ventricular Rate:  87 PR Interval:  154 QRS Duration:  92 QT Interval:  383 QTC Calculation: 461 R Axis:   36  Text Interpretation: Atrial fibrillation Atrial premature complexes Confirmed by Eber Hong ([id number]) on 10/08/2023 4:03:48 PM  Radiology No results found.  Procedures .Critical Care  Performed by: Eber Hong, MD Authorized by: Eber Hong, MD   Critical care provider statement:    Critical care time (minutes):  45   Critical care time was exclusive of:  Separately billable procedures and treating other patients and teaching time   Critical care was necessary to treat or prevent imminent or life-threatening deterioration of the following conditions:  Sepsis   Critical care was time spent personally by me on the following activities:  Development of treatment plan with patient or surrogate, discussions with consultants, evaluation of patient's response to treatment, examination of patient, obtaining history from patient or surrogate, review of old charts, re-evaluation of patient's condition, pulse oximetry, ordering and review of radiographic studies, ordering and review of laboratory studies and ordering and performing treatments and interventions   I assumed direction of critical care for this patient from another provider in my specialty: no     Care discussed with: admitting provider   Comments:           Medications Ordered in ED Medications  dextrose 5 %-0.9 % sodium chloride infusion (has no administration in time range)  meropenem (MERREM) 1 g in sodium  chloride 0.9 % 100 mL IVPB (1 g Intravenous New Bag/Given 10/08/23 1705)  magnesium sulfate IVPB 2 g 50 mL (has no administration in time range)  potassium chloride 10 mEq in 100 mL IVPB (has no administration in time range)  dextrose 50 % solution 50 mL (50 mLs  Intravenous Given 10/08/23 1605)  acetaminophen (TYLENOL) tablet 650 mg (650 mg Oral Given 10/08/23 1606)    ED Course/ Medical Decision Making/ A&P                                 Medical Decision Making Amount and/or Complexity of Data Reviewed Labs: ordered. Radiology: ordered. ECG/medicine tests: ordered.  Risk OTC drugs. Prescription drug management. Decision regarding hospitalization.    This patient presents to the ED for concern of fever, sepsis, hypoglycemia, this involves an extensive number of treatment options, and is a complaint that carries with it a high risk of complications and morbidity.  The differential diagnosis includes sepsis, multidrug-resistant urinary tract infection which was identified 2 days ago, no cultures are available, likely septic from that, the fever and illness likely spurring her hypoglycemia, she has not had very much to eat this morning, after being given oral glucose by paramedics and orange juice by the son she continues to troponin is 49 on arrival   Co morbidities that complicate the patient evaluation  Dementia, diabetes,   Additional history obtained:  Additional history obtained from medical record including prior echocardiogram showing LVH External records from outside source obtained and reviewed including prior echocardiogram, prior admissions   Lab Tests:  I Ordered, and personally interpreted labs.  The pertinent results include: Lab test show a leukocytosis of over 13,000, lactate of 2.2, negative COVID and flu, hypoglycemia to 59 and a potassium of 2.5, urinalysis reviewed from prior study 2 days ago showing likely Klebsiella multidrug-resistant organism   Imaging Studies ordered:  I ordered imaging studies including chest x-ray I independently visualized and interpreted imaging which showed cardiomegaly but no large infiltrate I agree with the radiologist interpretation   Cardiac Monitoring: / EKG:  The patient  was maintained on a cardiac monitor.  I personally viewed and interpreted the cardiac monitored which showed an underlying rhythm of: Borderline tachycardia   Consultations Obtained:  I requested consultation with the specialist,  and discussed lab and imaging findings as well as pertinent plan - they recommend: [place]   Problem List / ED Course / Critical interventions / Medication management  The patient technically meets sepsis criteria with an elevated lactate a elevated white blood cell count, a fever and a source of infection.  She is not vomiting at this time I ordered medication including meropenem and potassium supplementation as well as magnesium for urinary tract infection and sepsis Reevaluation of the patient after these medicines showed that the patient critically ill I have reviewed the patients home medicines and have made adjustments as needed   Social Determinants of Health:  Elderly, early dementia   Test / Admission - Considered:  Admit to the hospital, high level of care, critical care services given         Final Clinical Impression(s) / ED Diagnoses Final diagnoses:  Sepsis, due to unspecified organism, unspecified whether acute organ dysfunction present [facility])  Hypokalemia    Rx / DC Orders ED Discharge Orders     None         Eber Hong, MD 10/08/23 1710

## 2023-10-08 NOTE — Progress Notes (Signed)
Pharmacy Antibiotic Note  Dana Ellis is a 89 y.o. female admitted on 10/08/2023 with  ESBL infection .  Pharmacy has been consulted for meropenem dosing.  Plan: Meropenem 1 gram every 12 hours  Height: 5\' 4"  (162.6 cm) Weight: 73.4 kg (161 lb 14.4 oz) IBW/kg (Calculated) : 54.7  Temp (24hrs), Avg:101.3 F (38.5 C), Min:101.3 F (38.5 C), Max:101.3 F (38.5 C)  No results for input(s): "WBC", "CREATININE", "LATICACIDVEN", "VANCOTROUGH", "VANCOPEAK", "VANCORANDOM", "GENTTROUGH", "GENTPEAK", "GENTRANDOM", "TOBRATROUGH", "TOBRAPEAK", "TOBRARND", "AMIKACINPEAK", "AMIKACINTROU", "AMIKACIN" in the last 168 hours.  CrCl cannot be calculated (Patient's most recent lab result is older than the maximum 21 days allowed.).    Allergies  Allergen Reactions   Ace Inhibitors Cough    Antimicrobials this admission: ceftriaxone 10/20 >> 10/20 meropenem 10/20 >>   Dose adjustments this admission: N/a  Microbiology results: 10/20 BCx: in process   Thank you for allowing pharmacy to be a part of this patient's care.  Jaynie Bream 10/08/2023 4:29 PM

## 2023-10-08 NOTE — Assessment & Plan Note (Deleted)
Meeting severe sepsis criteria with fever of 101.3, leukocytosis of 13.8, with evidence of endorgan dysfunction, lactic acidosis of 2.2 > 2.6.  Severe sepsis likely secondary to complicated UTI with history of multidrug-resistant bacteria.  UA suggestive of UTI.  Chest x-ray negative for acute abnormality. -1 L bolus, continue  D5 N/s + 40kcl 75cc/hr x 12hrs - Trend lactic acid -UA urine cultures -Follow-up blood cultures

## 2023-10-09 DIAGNOSIS — N390 Urinary tract infection, site not specified: Secondary | ICD-10-CM | POA: Diagnosis not present

## 2023-10-09 LAB — BLOOD CULTURE ID PANEL (REFLEXED) - BCID2

## 2023-10-09 LAB — GLUCOSE, CAPILLARY
Glucose-Capillary: 102 mg/dL — ABNORMAL HIGH (ref 70–99)
Glucose-Capillary: 118 mg/dL — ABNORMAL HIGH (ref 70–99)
Glucose-Capillary: 157 mg/dL — ABNORMAL HIGH (ref 70–99)
Glucose-Capillary: 198 mg/dL — ABNORMAL HIGH (ref 70–99)
Glucose-Capillary: 88 mg/dL (ref 70–99)
Glucose-Capillary: 99 mg/dL (ref 70–99)

## 2023-10-09 LAB — BASIC METABOLIC PANEL
Anion gap: 6 (ref 5–15)
BUN: 23 mg/dL (ref 8–23)
CO2: 23 mmol/L (ref 22–32)
Calcium: 7.8 mg/dL — ABNORMAL LOW (ref 8.9–10.3)
Chloride: 103 mmol/L (ref 98–111)
Creatinine, Ser: 0.97 mg/dL (ref 0.44–1.00)
GFR, Estimated: 57 mL/min — ABNORMAL LOW (ref >60)
Glucose, Bld: 94 mg/dL (ref 70–99)
Potassium: 3.9 mmol/L (ref 3.5–5.1)
Sodium: 132 mmol/L — ABNORMAL LOW (ref 135–145)

## 2023-10-09 LAB — CBC
HCT: 29.3 % — ABNORMAL LOW (ref 36.0–46.0)
Hemoglobin: 9.8 g/dL — ABNORMAL LOW (ref 12.0–15.0)
MCH: 30.9 pg (ref 26.0–34.0)
MCHC: 33.4 g/dL (ref 30.0–36.0)
MCV: 92.4 fL (ref 80.0–100.0)
Platelets: 130 10*3/uL — ABNORMAL LOW (ref 150–400)
RBC: 3.17 MIL/uL — ABNORMAL LOW (ref 3.87–5.11)
RDW: 15.9 % — ABNORMAL HIGH (ref 11.5–15.5)
WBC: 22.9 10*3/uL — ABNORMAL HIGH (ref 4.0–10.5)
nRBC: 0 % (ref 0.0–0.2)

## 2023-10-09 LAB — URINALYSIS, W/ REFLEX TO CULTURE (INFECTION SUSPECTED)
Bilirubin Urine: NEGATIVE
Glucose, UA: NEGATIVE mg/dL
Ketones, ur: NEGATIVE mg/dL
Nitrite: NEGATIVE
Protein, ur: 100 mg/dL — AB
Specific Gravity, Urine: 1.006 (ref 1.005–1.030)
pH: 7 (ref 5.0–8.0)

## 2023-10-09 LAB — HEMOGLOBIN A1C
Hgb A1c MFr Bld: 6.4 % — ABNORMAL HIGH (ref 4.8–5.6)
Mean Plasma Glucose: 136.98 mg/dL

## 2023-10-09 MED ORDER — INSULIN ASPART 100 UNIT/ML IJ SOLN
0.0000 [IU] | Freq: Every day | INTRAMUSCULAR | Status: DC
  Administered 2023-10-13 – 2023-10-15 (×2): 2 [IU] via SUBCUTANEOUS

## 2023-10-09 MED ORDER — INSULIN ASPART 100 UNIT/ML IJ SOLN
0.0000 [IU] | Freq: Three times a day (TID) | INTRAMUSCULAR | Status: DC
  Administered 2023-10-10: 2 [IU] via SUBCUTANEOUS
  Administered 2023-10-11 (×2): 1 [IU] via SUBCUTANEOUS
  Administered 2023-10-12: 4 [IU] via SUBCUTANEOUS
  Administered 2023-10-12: 2 [IU] via SUBCUTANEOUS
  Administered 2023-10-12 – 2023-10-13 (×2): 1 [IU] via SUBCUTANEOUS
  Administered 2023-10-14: 2 [IU] via SUBCUTANEOUS
  Administered 2023-10-15: 1 [IU] via SUBCUTANEOUS
  Administered 2023-10-15 – 2023-10-16 (×2): 2 [IU] via SUBCUTANEOUS

## 2023-10-09 MED ORDER — POTASSIUM CHLORIDE 10 MEQ/100ML IV SOLN
INTRAVENOUS | Status: AC
  Filled 2023-10-09: qty 100

## 2023-10-09 NOTE — Progress Notes (Signed)
Transition of Care [facility]) - Inpatient Brief Assessment   Patient Details  Name: Dana Ellis MRN: [id number] Date of Birth: 1934-05-26  Transition of Care [facility]) CM/SW Contact:    Anntoinette Haefele A Onika Gudiel, RN Phone Number: 10/09/2023, 6:32 PM   Clinical Narrative: Transition of Care Department [facility]) has reviewed patient and no TOC needs have been identified at this time. We will continue to monitor patient advancement through interdisciplinary progression rounds. If new patient transition needs arise, please place a TOC consult.    Transition of Care Asessment: Insurance and Status: (P) Insurance coverage has been reviewed Patient has primary care physician: (P) Yes Home environment has been reviewed: (P) From home Prior level of function:: (P) Independent Prior/Current Home Services: (P) No current home services Social Determinants of Health Reivew: (P) SDOH reviewed no interventions necessary Readmission risk has been reviewed: (P) Yes Transition of care needs: (P) no transition of care needs at this time

## 2023-10-09 NOTE — Inpatient Diabetes Management (Signed)
Inpatient Diabetes Program Recommendations  AACE/ADA: New Consensus Statement on Inpatient Glycemic Control   Target Ranges:  Prepandial:   less than 140 mg/dL      Peak postprandial:   less than 180 mg/dL (1-2 hours)      Critically ill patients:  140 - 180 mg/dL     Latest Reference Range & Units 10/08/23 15:35 10/08/23 16:26 10/08/23 23:33 10/09/23 00:25 10/09/23 04:29  Glucose-Capillary 70 - 99 mg/dL 49 (L) 829 (H) 40 (LL) 99 118 (H)   Review of Glycemic Control  Diabetes history: DM2 Outpatient Diabetes medications: Tresiba 10-14 units daily (if CBG under 150 takes 10 units; if CBG over 150 takes 14 units) Current orders for Inpatient glycemic control: CBGs Q4H  Inpatient Diabetes Program Recommendations:    Insulin: Once CBGs are consistently over 150 mg/dl please consider ordering Novlog 0-6 units Q4H.  IV Fluids: Once hypoglycemia is completely resolved, please re-evaluate dextrose in IV fluids.  Thanks, Orlando Penner, RN, MSN, CDCES Diabetes Coordinator Inpatient Diabetes Program [phone] (Team Pager from 8am to 5pm)

## 2023-10-09 NOTE — Progress Notes (Signed)
Urine collected via I&O, tolerated well. Urine cloudy with sediment and odor. Sample taken to the lab.

## 2023-10-09 NOTE — Progress Notes (Signed)
PROGRESS NOTE     Dana Ellis, is a 89 y.o. female, DOB - Sep 24, 1934, BJY:[id number]  Admit date - 10/08/2023   Admitting Physician Onnie Boer, MD  Outpatient Primary MD for the patient is Benita Stabile, MD  LOS - 1  Chief Complaint  Patient presents with   Hypoglycemia        Brief Narrative:   89 y.o. female with medical history significant for hypertension, diabetes mellitus, depression, dementia admitted on 10/08/2019 for concern for recurrent UTI    -Assessment and Plan: 1)History of recurrent MDR UTI Complicated UTI with severe sepsis.   -Tmax 101.3 -T-current 97.9 --  Urine cultures 9/15 grew multidrug-resistant Klebsiella. -c/n IV meropenem pending further culture data  2)DM2- -Currently 6.4 reflecting excellent diabetic control PTA -Patient had hypoglycemia due to poor oral intake -Hold Tresiba Use Novolog/Humalog Sliding scale insulin with Accu-Cheks/Fingersticks as ordered   3)Acute metabolic encephalopathy superimposed on underlying dementia -Suspect cognitive and mentation issues worsened due to dehydration and presumed UTI  4)HypoNatremia/Hypokalemia----in the setting of dehydration -Hydrate orally and IV  5)PAFib--not on rate control agents chronically -Not on anticoagulation PTA  6)HTN---Hold amlodipine and clonidine due to soft BP  Status is: Inpatient   Disposition: The patient is from: Home              Anticipated d/c is to: Home              Anticipated d/c date is: 2 days              Patient currently is not medically stable to d/c. Barriers: Not Clinically Stable-   Code Status :  -  Code Status: Full Code   Family Communication:  Discussed with patient's son and grandson at bedside  DVT Prophylaxis  :   - SCDs  enoxaparin (LOVENOX) injection 40 mg Start: 10/08/23 2200   Lab Results  Component Value Date   PLT 130 (L) 10/09/2023    Inpatient Medications  Scheduled Meds:  citalopram  20 mg Oral Daily    enoxaparin (LOVENOX) injection  40 mg Subcutaneous Q24H   pantoprazole  40 mg Oral Daily   Continuous Infusions:  meropenem (MERREM) IV 1 g (10/09/23 0958)   PRN Meds:.acetaminophen **OR** acetaminophen, ondansetron **OR** ondansetron (ZOFRAN) IV, polyethylene glycol   Anti-infectives (From admission, onward)    Start     Dose/Rate Route Frequency Ordered Stop   10/08/23 1700  meropenem (MERREM) 1 g in sodium chloride 0.9 % 100 mL IVPB        1 g 200 mL/hr over 30 Minutes Intravenous Every 12 hours 10/08/23 1653     10/08/23 1545  cefTRIAXone (ROCEPHIN) 2 g in sodium chloride 0.9 % 100 mL IVPB  Status:  Discontinued        2 g 200 mL/hr over 30 Minutes Intravenous Every 24 hours 10/08/23 1541 10/08/23 1653         Subjective: Dana Ellis today has no further  fevers, no emesis,  No chest pain,    Patient's son and grandson at bedside, questions answered -Oral intake is fair --Tmax 101.3 -T-current 97.9  Objective: Vitals:   10/08/23 1939 10/09/23 0029 10/09/23 0427 10/09/23 1355  BP:  (!) 86/54 113/77 114/77  Pulse:  61 65 80  Resp:  18 20   Temp:  97.9 F (36.6 C) 97.7 F (36.5 C) 97.9 F (36.6 C)  TempSrc:  Oral Oral Oral  SpO2: 100% 97% 99% 98%  Weight:  Height:        Intake/Output Summary (Last 24 hours) at 10/09/2023 1913 Last data filed at 10/09/2023 1821 Gross per 24 hour  Intake 1019.9 ml  Output 500 ml  Net 519.9 ml   Filed Weights   10/08/23 1541  Weight: 73.4 kg    Physical Exam  Gen:- Awake Alert,  in no apparent distress  HEENT:- [place].AT, No sclera icterus Neck-Supple Neck,No JVD,.  Lungs-  CTAB , fair symmetrical air movement CV- S1, S2 normal, regular  Abd-  +ve B.Sounds, Abd Soft, No tenderness, no CVA tenderness Extremity/Skin:- No  edema, pedal pulses present  Psych-affect is appropriate, oriented x3, underlying cognitive and memory deficits persist--overall according to patient's son mentation appears to be  improving Neuro-no new focal deficits, no tremors  Data Reviewed: I have personally reviewed following labs and imaging studies  CBC: Recent Labs  Lab 10/08/23 1614 10/09/23 0532  WBC 13.8* 22.9*  NEUTROABS 11.5*  --   HGB 10.4* 9.8*  HCT 31.9* 29.3*  MCV 93.5 92.4  PLT 141* 130*   Basic Metabolic Panel: Recent Labs  Lab 10/08/23 1614 10/09/23 0532  NA 131* 132*  K 2.5* 3.9  CL 98 103  CO2 22 23  GLUCOSE 59* 94  BUN 23 23  CREATININE 0.98 0.97  CALCIUM 8.3* 7.8*  MG 1.2*  --    GFR: Estimated Creatinine Clearance: 40.1 mL/min (by C-G formula based on SCr of 0.97 mg/dL). Liver Function Tests: Recent Labs  Lab 10/08/23 1614  AST 51*  ALT 38  ALKPHOS 89  BILITOT 0.7  PROT 6.4*  ALBUMIN 2.8*   Recent Results (from the past 240 hour(s))  Microscopic Examination     Status: Abnormal   Collection Time: 10/06/23  9:52 AM   Urine  Result Value Ref Range Status   WBC, UA >30 (A) 0 - 5 /hpf Final   RBC, Urine 3-10 (A) 0 - 2 /hpf Final   Epithelial Cells (non renal) 0-10 0 - 10 /hpf Final   Bacteria, UA Many (A) None seen/Few Final  Blood Culture (routine x 2)     Status: None (Preliminary result)   Collection Time: 10/08/23  4:14 PM   Specimen: BLOOD RIGHT HAND  Result Value Ref Range Status   Specimen Description   Final    BLOOD RIGHT HAND BOTTLES DRAWN AEROBIC AND ANAEROBIC Performed at [facility], [address], [place], [place] [address]    Special Requests   Final    Blood Culture adequate volume Performed at [facility], [address] [place], [place] [address]    Culture  Setup Time   Final    GRAM NEGATIVE RODS IN BOTH AEROBIC AND ANAEROBIC BOTTLES Gram Stain Report Called to,Read Back By and Verified With: R TEJEDA AT 0838 ON [id number] BY S DALTON CRITICAL RESULT CALLED TO, READ BACK BY AND VERIFIED WITH: PHARMD STEVEN HURTH ON 10/09/23 @ 1617 BY DRT Performed at [facility], [address], [place], [place] [address]    Culture GRAM  NEGATIVE RODS  Final   Report Status PENDING  Incomplete  Blood Culture (routine x 2)     Status: None (Preliminary result)   Collection Time: 10/08/23  4:14 PM   Specimen: BLOOD RIGHT ARM  Result Value Ref Range Status   Specimen Description   Final    BLOOD RIGHT ARM BOTTLES DRAWN AEROBIC AND ANAEROBIC Performed at [facility], [address], [place], [place] [address]  Special Requests   Final    Blood Culture adequate volume Performed at [facility], [address], [place], [place] [address]    Culture  Setup Time   Final    GRAM NEGATIVE RODS IN BOTH AEROBIC AND ANAEROBIC BOTTLES Gram Stain Report Called to,Read Back By and Verified With: R TEJEDA AT 0838 ON [id number] BY S DALTON CRITICAL RESULT CALLED TO, READ BACK BY AND VERIFIED WITH: PHARMD STEVEN HURTH ON 10/09/23 @ 1617 BY DRT Performed at [facility], [address], [place], [place] [address]    Culture GRAM NEGATIVE RODS  Final   Report Status PENDING  Incomplete  Resp panel by RT-PCR (RSV, Flu A&B, Covid) Anterior Nasal Swab     Status: None   Collection Time: 10/08/23  4:19 PM   Specimen: Anterior Nasal Swab  Result Value Ref Range Status   SARS Coronavirus 2 by RT PCR NEGATIVE NEGATIVE Final    Comment: (NOTE) SARS-CoV-2 target nucleic acids are NOT DETECTED.  The SARS-CoV-2 RNA is generally detectable in upper respiratory specimens during the acute phase of infection. The lowest concentration of SARS-CoV-2 viral copies this assay can detect is 138 copies/mL. A negative result does not preclude SARS-Cov-2 infection and should not be used as the sole basis for treatment or other patient management decisions. A negative result may occur with  improper specimen collection/handling, submission of specimen other than nasopharyngeal swab, presence of viral mutation(s) within the areas targeted by this assay, and inadequate number of viral copies(<138 copies/mL). A negative result must be combined  with clinical observations, patient history, and epidemiological information. The expected result is Negative.  Fact Sheet for Patients:  BloggerCourse.com  Fact Sheet for Healthcare Providers:  SeriousBroker.it  This test is no t yet approved or cleared by the [place] FDA and  has been authorized for detection and/or diagnosis of SARS-CoV-2 by FDA under an Emergency Use Authorization (EUA). This EUA will remain  in effect (meaning this test can be used) for the duration of the COVID-19 declaration under Section 564(b)(1) of the Act, 21 U.S.C.section 360bbb-3(b)(1), unless the authorization is terminated  or revoked sooner.       Influenza A by PCR NEGATIVE NEGATIVE Final   Influenza B by PCR NEGATIVE NEGATIVE Final    Comment: (NOTE) The Xpert Xpress SARS-CoV-2/FLU/RSV plus assay is intended as an aid in the diagnosis of influenza from Nasopharyngeal swab specimens and should not be used as a sole basis for treatment. Nasal washings and aspirates are unacceptable for Xpert Xpress SARS-CoV-2/FLU/RSV testing.  Fact Sheet for Patients: BloggerCourse.com  Fact Sheet for Healthcare Providers: SeriousBroker.it  This test is not yet approved or cleared by the [place] FDA and has been authorized for detection and/or diagnosis of SARS-CoV-2 by FDA under an Emergency Use Authorization (EUA). This EUA will remain in effect (meaning this test can be used) for the duration of the COVID-19 declaration under Section 564(b)(1) of the Act, 21 U.S.C. section 360bbb-3(b)(1), unless the authorization is terminated or revoked.     Resp Syncytial Virus by PCR NEGATIVE NEGATIVE Final    Comment: (NOTE) Fact Sheet for Patients: BloggerCourse.com  Fact Sheet for Healthcare Providers: SeriousBroker.it  This test is not yet approved  or cleared by the [place] FDA and has been authorized for detection and/or diagnosis of SARS-CoV-2 by FDA under an Emergency Use Authorization (EUA). This EUA will remain in effect (meaning this test can be used) for the duration of the COVID-19  declaration under Section 564(b)(1) of the Act, 21 U.S.C. section 360bbb-3(b)(1), unless the authorization is terminated or revoked.  Performed at [facility], [address] [place], [place] [address]     Radiology Studies: CT HEAD WO CONTRAST ( )  Result Date: 10/09/2023 CLINICAL DATA:  Altered mental status, sepsis, UTI EXAM: CT HEAD WITHOUT CONTRAST TECHNIQUE: Contiguous axial images were obtained from the base of the skull through the vertex without intravenous contrast. RADIATION DOSE REDUCTION: This exam was performed according to the departmental dose-optimization program which includes automated exposure control, adjustment of the mA and/or kV according to patient size and/or use of iterative reconstruction technique. COMPARISON:  09/14/2012 FINDINGS: Brain: No evidence of acute infarction, hemorrhage, mass, mass effect, or midline shift. No hydrocephalus or extra-axial fluid collection. Age related cerebral atrophy, which is within normal limits. Periventricular white matter changes, likely the sequela of chronic small vessel ischemic disease. Vascular: No hyperdense vessel. Atherosclerotic calcifications in the intracranial carotid and vertebral arteries. Skull: Negative for fracture or focal lesion. Sinuses/Orbits: No acute finding. Other: The mastoid air cells are well aerated. IMPRESSION: No acute intracranial process. Electronically Signed   By: Wiliam Ke M.D.   On: 10/09/2023 00:11   DG Chest Port 1 View  Result Date: 10/08/2023 CLINICAL DATA:  Sepsis, fever EXAM: PORTABLE CHEST 1 VIEW COMPARISON:  01/27/2021 FINDINGS: Lungs are clear. No pneumothorax or pleural effusion. Large hiatal hernia again noted, better visualized on  CT examination of 06/25/2019. Stable mild cardiomegaly. No acute bone abnormality. IMPRESSION: 1. No active disease. 2. Large hiatal hernia. 3. Stable cardiomegaly Electronically Signed   By: Helyn Numbers M.D.   On: 10/08/2023 17:46    Scheduled Meds:  citalopram  20 mg Oral Daily   enoxaparin (LOVENOX) injection  40 mg Subcutaneous Q24H   pantoprazole  40 mg Oral Daily   Continuous Infusions:  meropenem (MERREM) IV 1 g (10/09/23 0958)    LOS: 1 day   Shon Hale M.D on 10/09/2023 at 7:13 PM  Go to [email or web address] - for contact info  [facility] - Office  [phone]  If 7PM-7AM, please contact night-coverage [email or web address] 10/09/2023, 7:13 PM

## 2023-10-09 NOTE — Progress Notes (Addendum)
PHARMACY - PHYSICIAN COMMUNICATION CRITICAL VALUE ALERT - BLOOD CULTURE IDENTIFICATION (BCID)  Dana Ellis is an 89 y.o. female who presented to [facility] on 10/08/2023   Assessment:  enterobacterales 4/4 bottles  Name of physician (or Provider) Contacted: Dr. Mariea Clonts  Current antibiotics: meropenem 1000 mg IV every 12 hours.  Changes to prescribed antibiotics recommended:  Patient is on recommended antibiotics - No changes needed.   No results found for this or any previous visit.  Dana Ellis 10/09/2023  5:01 PM

## 2023-10-10 DIAGNOSIS — N390 Urinary tract infection, site not specified: Secondary | ICD-10-CM | POA: Diagnosis not present

## 2023-10-10 LAB — URINE CULTURE: Culture: NO GROWTH

## 2023-10-10 LAB — GLUCOSE, CAPILLARY
Glucose-Capillary: 156 mg/dL — ABNORMAL HIGH (ref 70–99)
Glucose-Capillary: 143 mg/dL — ABNORMAL HIGH (ref 70–99)
Glucose-Capillary: 223 mg/dL — ABNORMAL HIGH (ref 70–99)
Glucose-Capillary: 147 mg/dL — ABNORMAL HIGH (ref 70–99)
Glucose-Capillary: 164 mg/dL — ABNORMAL HIGH (ref 70–99)

## 2023-10-10 NOTE — Plan of Care (Signed)

## 2023-10-10 NOTE — Plan of Care (Signed)

## 2023-10-10 NOTE — Progress Notes (Signed)
PROGRESS NOTE     Dana Ellis, is a 89 y.o. female, DOB - 07-Jul-1934, ZOX:[id number]  Admit date - 10/08/2023   Admitting Physician Onnie Boer, MD  Outpatient Primary MD for the patient is Benita Stabile, MD  LOS - 2  Chief Complaint  Patient presents with   Hypoglycemia      Brief Narrative:   89 y.o. female with medical history significant for hypertension, diabetes mellitus, depression, dementia admitted on 10/08/2023 concern for recurrent UTI --Awaiting blood culture result from 10/08/2023 to decide on discharge antibiotic choice   -Assessment and Plan: 1)History of Recurrent MDR UTI Complicated UTI with severe sepsis.   -Further fevers --  Urine cultures 9/15 grew multidrug-resistant Klebsiella. -Blood cultures from 10/08/2023 with Enterobacterales group---final ID and sensitivity pending --Awaiting blood culture result from 10/08/2023 to decide on discharge antibiotic choice --c/n IV meropenem pending further culture data  2)DM2- -Currently 6.4 reflecting excellent diabetic control PTA -Patient had hypoglycemia due to poor oral intake -Hold Tresiba Use Novolog/Humalog Sliding scale insulin with Accu-Cheks/Fingersticks as ordered   3)Acute metabolic encephalopathy superimposed on underlying dementia -Suspect cognitive and mentation issues worsened due to dehydration and presumed UTI  4)HypoNatremia/Hypokalemia----in the setting of dehydration -Hydrate orally and IV  5)PAFib--not on rate control agents chronically -Not on anticoagulation PTA  6)HTN---Hold amlodipine and clonidine due to soft BP  7)GERD--- continue Protonix  Status is: Inpatient   Disposition: The patient is from: Home              Anticipated d/c is to: Home              Anticipated d/c date is: 1 day              Patient currently is not medically stable to d/c.---- Barriers: Not Clinically Stable- ----Awaiting blood culture result from 10/08/2023 to decide on discharge antibiotic  choice  Code Status :  -  Code Status: Full Code   Family Communication:  Discussed with patient's son and grandson  DVT Prophylaxis  :   - SCDs  enoxaparin (LOVENOX) injection 40 mg Start: 10/08/23 2200   Lab Results  Component Value Date   PLT 130 (L) 10/09/2023    Inpatient Medications  Scheduled Meds:  citalopram  20 mg Oral Daily   enoxaparin (LOVENOX) injection  40 mg Subcutaneous Q24H   insulin aspart  0-5 Units Subcutaneous QHS   insulin aspart  0-6 Units Subcutaneous TID WC   pantoprazole  40 mg Oral Daily   Continuous Infusions:  meropenem (MERREM) IV 1 g (10/10/23 0954)   PRN Meds:.acetaminophen **OR** acetaminophen, ondansetron **OR** ondansetron (ZOFRAN) IV, polyethylene glycol   Anti-infectives (From admission, onward)    Start     Dose/Rate Route Frequency Ordered Stop   10/08/23 1700  meropenem (MERREM) 1 g in sodium chloride 0.9 % 100 mL IVPB        1 g 200 mL/hr over 30 Minutes Intravenous Every 12 hours 10/08/23 1653     10/08/23 1545  cefTRIAXone (ROCEPHIN) 2 g in sodium chloride 0.9 % 100 mL IVPB  Status:  Discontinued        2 g 200 mL/hr over 30 Minutes Intravenous Every 24 hours 10/08/23 1541 10/08/23 1653         Subjective: Dana Ellis today has no further  fevers, no emesis,  No chest pain,    -Eating better  Objective: Vitals:   10/09/23 1355 10/09/23 2120 10/10/23 0328 10/10/23 1429  BP: 114/77  128/80 137/85 133/73  Pulse: 80 71 72   Resp:  20 18   Temp: 97.9 F (36.6 C) 98.4 F (36.9 C) 97.9 F (36.6 C) 98.2 F (36.8 C)  TempSrc: Oral   Oral  SpO2: 98% 98% 98% 97%  Weight:      Height:        Intake/Output Summary (Last 24 hours) at 10/10/2023 1954 Last data filed at 10/10/2023 1818 Gross per 24 hour  Intake 360 ml  Output 1500 ml  Net -1140 ml   Filed Weights   10/08/23 1541  Weight: 73.4 kg    Physical Exam  Gen:- Awake Alert,  in no apparent distress  HEENT:- .AT, No sclera icterus Neck-Supple  Neck,No JVD,.  Lungs-  CTAB , fair symmetrical air movement CV- S1, S2 normal, regular  Abd-  +ve B.Sounds, Abd Soft, No tenderness, no CVA tenderness Extremity/Skin:- No  edema, pedal pulses present  Psych-affect is appropriate, oriented x3, underlying cognitive and memory deficits persist--overall according to patient's son mentation appears to be improving Neuro-no new focal deficits, no tremors  Data Reviewed: I have personally reviewed following labs and imaging studies  CBC: Recent Labs  Lab 10/08/23 1614 10/09/23 0532  WBC 13.8* 22.9*  NEUTROABS 11.5*  --   HGB 10.4* 9.8*  HCT 31.9* 29.3*  MCV 93.5 92.4  PLT 141* 130*   Basic Metabolic Panel: Recent Labs  Lab 10/08/23 1614 10/09/23 0532  NA 131* 132*  K 2.5* 3.9  CL 98 103  CO2 22 23  GLUCOSE 59* 94  BUN 23 23  CREATININE 0.98 0.97  CALCIUM 8.3* 7.8*  MG 1.2*  --    GFR: Estimated Creatinine Clearance: 40.1 mL/min (by C-G formula based on SCr of 0.97 mg/dL). Liver Function Tests: Recent Labs  Lab 10/08/23 1614  AST 51*  ALT 38  ALKPHOS 89  BILITOT 0.7  PROT 6.4*  ALBUMIN 2.8*   Recent Results (from the past 240 hour(s))  Microscopic Examination     Status: Abnormal   Collection Time: 10/06/23  9:52 AM   Urine  Result Value Ref Range Status   WBC, UA >30 (A) 0 - 5 /hpf Final   RBC, Urine 3-10 (A) 0 - 2 /hpf Final   Epithelial Cells (non renal) 0-10 0 - 10 /hpf Final   Bacteria, UA Many (A) None seen/Few Final  Blood Culture (routine x 2)     Status: None (Preliminary result)   Collection Time: 10/08/23  4:14 PM   Specimen: BLOOD RIGHT HAND  Result Value Ref Range Status   Specimen Description   Final    BLOOD RIGHT HAND BOTTLES DRAWN AEROBIC AND ANAEROBIC Performed at [facility], [address], [place], [place] [address]    Special Requests   Final    Blood Culture adequate volume Performed at [facility], [address], [place], [place] [address]    Culture  Setup Time   Final    GRAM  NEGATIVE RODS IN BOTH AEROBIC AND ANAEROBIC BOTTLES Gram Stain Report Called to,Read Back By and Verified With: R TEJEDA AT 0838 ON [id number] BY S DALTON CRITICAL RESULT CALLED TO, READ BACK BY AND VERIFIED WITH: PHARMD STEVEN HURTH ON 10/09/23 @ 1617 BY DRT    Culture   Final    GRAM NEGATIVE RODS IDENTIFICATION AND SUSCEPTIBILITIES TO FOLLOW Performed at [facility], [address], [place], [place] [address]    Report Status PENDING  Incomplete  Blood Culture (routine  x 2)     Status: None (Preliminary result)   Collection Time: 10/08/23  4:14 PM   Specimen: BLOOD RIGHT ARM  Result Value Ref Range Status   Specimen Description   Final    BLOOD RIGHT ARM BOTTLES DRAWN AEROBIC AND ANAEROBIC Performed at [facility], [address], [place], [place] [address]    Special Requests   Final    Blood Culture adequate volume Performed at [facility], [address] [place], [place] [address]    Culture  Setup Time   Final    GRAM NEGATIVE RODS IN BOTH AEROBIC AND ANAEROBIC BOTTLES Gram Stain Report Called to,Read Back By and Verified With: R TEJEDA AT 0838 ON [id number] BY S DALTON CRITICAL RESULT CALLED TO, READ BACK BY AND VERIFIED WITH: PHARMD STEVEN HURTH ON 10/09/23 @ 1617 BY DRT Performed at [facility], [address] [place], [place] [address]    Culture GRAM NEGATIVE RODS  Final   Report Status PENDING  Incomplete  Blood Culture ID Panel (Reflexed)     Status: Abnormal   Collection Time: 10/08/23  4:14 PM  Result Value Ref Range Status   Enterococcus faecalis NOT DETECTED NOT DETECTED Final   Enterococcus Faecium NOT DETECTED NOT DETECTED Final   Listeria monocytogenes NOT DETECTED NOT DETECTED Final   Staphylococcus species NOT DETECTED NOT DETECTED Final   Staphylococcus aureus (BCID) NOT DETECTED NOT DETECTED Final   Staphylococcus epidermidis NOT DETECTED NOT DETECTED Final   Staphylococcus lugdunensis NOT DETECTED NOT DETECTED Final   Streptococcus  species NOT DETECTED NOT DETECTED Final   Streptococcus agalactiae NOT DETECTED NOT DETECTED Final   Streptococcus pneumoniae NOT DETECTED NOT DETECTED Final   Streptococcus pyogenes NOT DETECTED NOT DETECTED Final   A.calcoaceticus-baumannii NOT DETECTED NOT DETECTED Final   Bacteroides fragilis NOT DETECTED NOT DETECTED Final   Enterobacterales DETECTED (A) NOT DETECTED Final    Comment: Enterobacterales represent a large order of gram negative bacteria, not a single organism. Refer to culture for further identification. CRITICAL RESULT CALLED TO, READ BACK BY AND VERIFIED WITH: PHARMD STEVEN HURTH ON 10/09/23 @ 1617 BY DRT    Enterobacter cloacae complex NOT DETECTED NOT DETECTED Final   Escherichia coli NOT DETECTED NOT DETECTED Final   Klebsiella aerogenes NOT DETECTED NOT DETECTED Final   Klebsiella oxytoca NOT DETECTED NOT DETECTED Final   Klebsiella pneumoniae NOT DETECTED NOT DETECTED Final   Proteus species NOT DETECTED NOT DETECTED Final   Salmonella species NOT DETECTED NOT DETECTED Final   Serratia marcescens NOT DETECTED NOT DETECTED Final   Haemophilus influenzae NOT DETECTED NOT DETECTED Final   Neisseria meningitidis NOT DETECTED NOT DETECTED Final   Pseudomonas aeruginosa NOT DETECTED NOT DETECTED Final   Stenotrophomonas maltophilia NOT DETECTED NOT DETECTED Final   Candida albicans NOT DETECTED NOT DETECTED Final   Candida auris NOT DETECTED NOT DETECTED Final   Candida glabrata NOT DETECTED NOT DETECTED Final   Candida krusei NOT DETECTED NOT DETECTED Final   Candida parapsilosis NOT DETECTED NOT DETECTED Final   Candida tropicalis NOT DETECTED NOT DETECTED Final   Cryptococcus neoformans/gattii NOT DETECTED NOT DETECTED Final   CTX-M ESBL NOT DETECTED NOT DETECTED Final   Carbapenem resistance IMP NOT DETECTED NOT DETECTED Final   Carbapenem resistance KPC NOT DETECTED NOT DETECTED Final   Carbapenem resistance NDM NOT DETECTED NOT DETECTED Final   Carbapenem  resist OXA 48 LIKE NOT DETECTED NOT DETECTED Final   Carbapenem resistance VIM NOT DETECTED  NOT DETECTED Final    Comment: Performed at [facility], [address], [place], [place] [address]  Resp panel by RT-PCR (RSV, Flu A&B, Covid) Anterior Nasal Swab     Status: None   Collection Time: 10/08/23  4:19 PM   Specimen: Anterior Nasal Swab  Result Value Ref Range Status   SARS Coronavirus 2 by RT PCR NEGATIVE NEGATIVE Final    Comment: (NOTE) SARS-CoV-2 target nucleic acids are NOT DETECTED.  The SARS-CoV-2 RNA is generally detectable in upper respiratory specimens during the acute phase of infection. The lowest concentration of SARS-CoV-2 viral copies this assay can detect is 138 copies/mL. A negative result does not preclude SARS-Cov-2 infection and should not be used as the sole basis for treatment or other patient management decisions. A negative result may occur with  improper specimen collection/handling, submission of specimen other than nasopharyngeal swab, presence of viral mutation(s) within the areas targeted by this assay, and inadequate number of viral copies(<138 copies/mL). A negative result must be combined with clinical observations, patient history, and epidemiological information. The expected result is Negative.  Fact Sheet for Patients:  BloggerCourse.com  Fact Sheet for Healthcare Providers:  SeriousBroker.it  This test is no t yet approved or cleared by the [place] FDA and  has been authorized for detection and/or diagnosis of SARS-CoV-2 by FDA under an Emergency Use Authorization (EUA). This EUA will remain  in effect (meaning this test can be used) for the duration of the COVID-19 declaration under Section 564(b)(1) of the Act, 21 U.S.C.section 360bbb-3(b)(1), unless the authorization is terminated  or revoked sooner.       Influenza A by PCR NEGATIVE NEGATIVE Final   Influenza B by PCR  NEGATIVE NEGATIVE Final    Comment: (NOTE) The Xpert Xpress SARS-CoV-2/FLU/RSV plus assay is intended as an aid in the diagnosis of influenza from Nasopharyngeal swab specimens and should not be used as a sole basis for treatment. Nasal washings and aspirates are unacceptable for Xpert Xpress SARS-CoV-2/FLU/RSV testing.  Fact Sheet for Patients: BloggerCourse.com  Fact Sheet for Healthcare Providers: SeriousBroker.it  This test is not yet approved or cleared by the [place] FDA and has been authorized for detection and/or diagnosis of SARS-CoV-2 by FDA under an Emergency Use Authorization (EUA). This EUA will remain in effect (meaning this test can be used) for the duration of the COVID-19 declaration under Section 564(b)(1) of the Act, 21 U.S.C. section 360bbb-3(b)(1), unless the authorization is terminated or revoked.     Resp Syncytial Virus by PCR NEGATIVE NEGATIVE Final    Comment: (NOTE) Fact Sheet for Patients: BloggerCourse.com  Fact Sheet for Healthcare Providers: SeriousBroker.it  This test is not yet approved or cleared by the [place] FDA and has been authorized for detection and/or diagnosis of SARS-CoV-2 by FDA under an Emergency Use Authorization (EUA). This EUA will remain in effect (meaning this test can be used) for the duration of the COVID-19 declaration under Section 564(b)(1) of the Act, 21 U.S.C. section 360bbb-3(b)(1), unless the authorization is terminated or revoked.  Performed at [facility], [address], [place], [place] [address]   Urine Culture (for pregnant, neutropenic or urologic patients or patients with an indwelling urinary catheter)     Status: None   Collection Time: 10/09/23  5:00 AM   Specimen: In/Out Cath Urine  Result Value Ref Range Status   Specimen Description   Final    IN/OUT CATH URINE Performed at [facility], [address], [place], [place] [address]    Special Requests   Final    NONE Performed at [facility], [address] [place], [place] [address]    Culture   Final    NO GROWTH Performed at [facility], [address] [place], [place] [address]    Report Status 10/10/2023 FINAL  Final    Radiology Studies: CT HEAD WO CONTRAST ( )  Result Date: 10/09/2023 CLINICAL DATA:  Altered mental status, sepsis, UTI EXAM: CT HEAD WITHOUT CONTRAST TECHNIQUE: Contiguous axial images were obtained from the base of the skull through the vertex without intravenous contrast. RADIATION DOSE REDUCTION: This exam was performed according to the departmental dose-optimization program which includes automated exposure control, adjustment of the mA and/or kV according to patient size and/or use of iterative reconstruction technique. COMPARISON:  09/14/2012 FINDINGS: Brain: No evidence of acute infarction, hemorrhage, mass, mass effect, or midline shift. No hydrocephalus or extra-axial fluid collection. Age related cerebral atrophy, which is within normal limits. Periventricular white matter changes, likely the sequela of chronic small vessel ischemic disease. Vascular: No hyperdense vessel. Atherosclerotic calcifications in the intracranial carotid and vertebral arteries. Skull: Negative for fracture or focal lesion. Sinuses/Orbits: No acute finding. Other: The mastoid air cells are well aerated. IMPRESSION: No acute intracranial process. Electronically Signed   By: Wiliam Ke M.D.   On: 10/09/2023 00:11    Scheduled Meds:  citalopram  20 mg Oral Daily   enoxaparin (LOVENOX) injection  40 mg Subcutaneous Q24H   insulin aspart  0-5 Units Subcutaneous QHS   insulin aspart  0-6 Units Subcutaneous TID WC   pantoprazole  40 mg Oral Daily   Continuous Infusions:  meropenem (MERREM) IV 1 g (10/10/23 0954)    LOS: 2 days   Shon Hale M.D on 10/10/2023 at 7:54 PM  Go to [email or web address] - for  contact info  [facility] - Office  [phone]  If 7PM-7AM, please contact night-coverage [email or web address] 10/10/2023, 7:54 PM

## 2023-10-11 DIAGNOSIS — N390 Urinary tract infection, site not specified: Secondary | ICD-10-CM | POA: Diagnosis not present

## 2023-10-11 DIAGNOSIS — A419 Sepsis, unspecified organism: Secondary | ICD-10-CM | POA: Diagnosis not present

## 2023-10-11 DIAGNOSIS — I4891 Unspecified atrial fibrillation: Secondary | ICD-10-CM

## 2023-10-11 DIAGNOSIS — Z1624 Resistance to multiple antibiotics: Secondary | ICD-10-CM

## 2023-10-11 DIAGNOSIS — E876 Hypokalemia: Secondary | ICD-10-CM | POA: Diagnosis not present

## 2023-10-11 DIAGNOSIS — E119 Type 2 diabetes mellitus without complications: Secondary | ICD-10-CM

## 2023-10-11 DIAGNOSIS — I10 Essential (primary) hypertension: Secondary | ICD-10-CM | POA: Diagnosis not present

## 2023-10-11 LAB — CBC
HCT: 31.2 % — ABNORMAL LOW (ref 36.0–46.0)
Hemoglobin: 10.5 g/dL — ABNORMAL LOW (ref 12.0–15.0)
MCH: 31.2 pg (ref 26.0–34.0)
MCHC: 33.7 g/dL (ref 30.0–36.0)
MCV: 92.6 fL (ref 80.0–100.0)
Platelets: 141 10*3/uL — ABNORMAL LOW (ref 150–400)
RBC: 3.37 MIL/uL — ABNORMAL LOW (ref 3.87–5.11)
RDW: 16.2 % — ABNORMAL HIGH (ref 11.5–15.5)
WBC: 11.7 10*3/uL — ABNORMAL HIGH (ref 4.0–10.5)
nRBC: 0 % (ref 0.0–0.2)

## 2023-10-11 LAB — GLUCOSE, CAPILLARY
Glucose-Capillary: 119 mg/dL — ABNORMAL HIGH (ref 70–99)
Glucose-Capillary: 164 mg/dL — ABNORMAL HIGH (ref 70–99)
Glucose-Capillary: 173 mg/dL — ABNORMAL HIGH (ref 70–99)
Glucose-Capillary: 177 mg/dL — ABNORMAL HIGH (ref 70–99)

## 2023-10-11 LAB — BASIC METABOLIC PANEL
Anion gap: 6 (ref 5–15)
BUN: 23 mg/dL (ref 8–23)
CO2: 22 mmol/L (ref 22–32)
Calcium: 8.3 mg/dL — ABNORMAL LOW (ref 8.9–10.3)
Chloride: 106 mmol/L (ref 98–111)
Creatinine, Ser: 0.99 mg/dL (ref 0.44–1.00)
GFR, Estimated: 55 mL/min — ABNORMAL LOW (ref >60)
Glucose, Bld: 122 mg/dL — ABNORMAL HIGH (ref 70–99)
Potassium: 3.9 mmol/L (ref 3.5–5.1)
Sodium: 134 mmol/L — ABNORMAL LOW (ref 135–145)

## 2023-10-11 LAB — CULTURE, BLOOD (ROUTINE X 2)
Special Requests: ADEQUATE
Special Requests: ADEQUATE

## 2023-10-11 NOTE — Progress Notes (Signed)
PROGRESS NOTE     Dana Ellis, is a 89 y.o. female, DOB - 05-Mar-1934, WUJ:[id number]  Admit date - 10/08/2023   Admitting Physician Onnie Boer, MD  Outpatient Primary MD for the patient is Benita Stabile, MD  LOS - 3  Chief Complaint  Patient presents with   Hypoglycemia      Brief Narrative:   89 y.o. female with medical history significant for hypertension, diabetes mellitus, depression, dementia admitted on 10/08/2023 concern for recurrent UTI --Awaiting blood culture result from 10/08/2023 to decide on discharge antibiotic choice   -Assessment and Plan: 1)History of Recurrent MDR UTI Complicated UTI with severe sepsis.   -Further fevers --  Urine cultures 9/15 grew multidrug-resistant Klebsiella. -Blood cultures from 10/08/2023 with Enterobacterales group---final ID and sensitivity pending --Awaiting blood culture result from 10/08/2023 to decide on discharge antibiotic choice --c/n IV meropenem pending further culture data -Hopefully able to discharge home on adequate antibiotic therapy by mouth on 10/12/2023.  2)DM2- -Currently 6.4 reflecting excellent diabetic control PTA -Patient had hypoglycemia due to poor oral intake; continue to follow CBG fluctuation. -Continue to hold [place] -Continue sliding scale insulin.  3)Acute metabolic encephalopathy superimposed on underlying dementia -Suspect cognitive and mentation issues worsened due to dehydration and presumed UTI -Continue constant reorientation and continue treatment for UTI. -Patient advised to maintain adequate oral hydration.  4)HypoNatremia/Hypokalemia----in the setting of dehydration -Continue to maintain adequate hydration.  5)PAFib--not on rate controlling agents chronically -Not on anticoagulation PTA -Rate stable and controlled on telemetry evaluation.  6)HTN--- -continue to hold amlodipine and clonidine due to soft BP  7)GERD--- continue Protonix -Patient encouraged to request as  needed antiemetics.  Status is: Inpatient   Disposition: The patient is from: Home              Anticipated d/c is to: Home              Anticipated d/c date is: 1 day (10/12/2023              Patient currently is not medically stable to d/c.---- Barriers: Not Clinically Stable- ----Awaiting blood culture result from 10/08/2023 to decide on discharge antibiotic choice  Code Status :  -  Code Status: Full Code   Family Communication:  Discussed with patient's son and grandson  DVT Prophylaxis  :   - SCDs  enoxaparin (LOVENOX) injection 40 mg Start: 10/08/23 2200   Lab Results  Component Value Date   PLT 141 (L) 10/11/2023    Inpatient Medications  Scheduled Meds:  citalopram  20 mg Oral Daily   enoxaparin (LOVENOX) injection  40 mg Subcutaneous Q24H   insulin aspart  0-5 Units Subcutaneous QHS   insulin aspart  0-6 Units Subcutaneous TID WC   pantoprazole  40 mg Oral Daily   Continuous Infusions:  meropenem (MERREM) IV 1 g (10/11/23 0902)   PRN Meds:.acetaminophen **OR** acetaminophen, ondansetron **OR** ondansetron (ZOFRAN) IV, polyethylene glycol   Anti-infectives (From admission, onward)    Start     Dose/Rate Route Frequency Ordered Stop   10/08/23 1700  meropenem (MERREM) 1 g in sodium chloride 0.9 % 100 mL IVPB        1 g 200 mL/hr over 30 Minutes Intravenous Every 12 hours 10/08/23 1653     10/08/23 1545  cefTRIAXone (ROCEPHIN) 2 g in sodium chloride 0.9 % 100 mL IVPB  Status:  Discontinued        2 g 200 mL/hr over 30 Minutes Intravenous Every 24 hours  10/08/23 1541 10/08/23 1653         Subjective: Dana Ellis afebrile, no chest pain, no shortness of breath and reported improvement in her appetite.  Expressed intermittent nausea but no further episode of vomiting, no overt bleeding.  Objective: Vitals:   10/10/23 1429 10/10/23 2020 10/11/23 0337 10/11/23 1320  BP: 133/73 (!) 148/86 (!) 159/88 (!) 170/99  Pulse:  61 72 83  Resp:  18 18 17   Temp:  98.2 F (36.8 C) 97.6 F (36.4 C) 98.4 F (36.9 C) 98 F (36.7 C)  TempSrc: Oral     SpO2: 97% 96% 97% 97%  Weight:      Height:        Intake/Output Summary (Last 24 hours) at 10/11/2023 1659 Last data filed at 10/11/2023 1300 Gross per 24 hour  Intake 840 ml  Output 800 ml  Net 40 ml   Filed Weights   10/08/23 1541  Weight: 73.4 kg    Physical Exam General exam: Alert, awake, oriented x 3; in no acute distress.  Reporting intermittent nausea but no vomiting.  Patient is afebrile. Respiratory system: Clear to auscultation. Respiratory effort normal.  Good saturation on room air. Cardiovascular system: Rate controlled, no rubs, no gallops, no JVD on exam. Gastrointestinal system: Abdomen is nondistended, soft and with positive bowel counts. Central nervous system: No focal neurological deficits. Extremities: No cyanosis or clubbing.  No edema. Skin: No petechiae. Psychiatry: Mood & affect appropriate.    Data Reviewed: I have personally reviewed following labs and imaging studies  CBC: Recent Labs  Lab 10/08/23 1614 10/09/23 0532 10/11/23 0440  WBC 13.8* 22.9* 11.7*  NEUTROABS 11.5*  --   --   HGB 10.4* 9.8* 10.5*  HCT 31.9* 29.3* 31.2*  MCV 93.5 92.4 92.6  PLT 141* 130* 141*   Basic Metabolic Panel: Recent Labs  Lab 10/08/23 1614 10/09/23 0532 10/11/23 0440  NA 131* 132* 134*  K 2.5* 3.9 3.9  CL 98 103 106  CO2 22 23 22   GLUCOSE 59* 94 122*  BUN 23 23 23   CREATININE 0.98 0.97 0.99  CALCIUM 8.3* 7.8* 8.3*  MG 1.2*  --   --    GFR: Estimated Creatinine Clearance: 39.3 mL/min (by C-G formula based on SCr of 0.99 mg/dL).  Liver Function Tests: Recent Labs  Lab 10/08/23 1614  AST 51*  ALT 38  ALKPHOS 89  BILITOT 0.7  PROT 6.4*  ALBUMIN 2.8*   Recent Results (from the past 240 hour(s))  Microscopic Examination     Status: Abnormal   Collection Time: 10/06/23  9:52 AM   Urine  Result Value Ref Range Status   WBC, UA >30 (A) 0 - 5 /hpf  Final   RBC, Urine 3-10 (A) 0 - 2 /hpf Final   Epithelial Cells (non renal) 0-10 0 - 10 /hpf Final   Bacteria, UA Many (A) None seen/Few Final  Blood Culture (routine x 2)     Status: Abnormal   Collection Time: 10/08/23  4:14 PM   Specimen: BLOOD RIGHT HAND  Result Value Ref Range Status   Specimen Description   Final    BLOOD RIGHT HAND BOTTLES DRAWN AEROBIC AND ANAEROBIC Performed at [facility], [address], [place], [place] [address]    Special Requests   Final    Blood Culture adequate volume Performed at [facility], [place], [address] [place], [place] [address]    Culture  Setup Time   Final    Romie Minus  NEGATIVE RODS IN BOTH AEROBIC AND ANAEROBIC BOTTLES Gram Stain Report Called to,Read Back By and Verified With: R TEJEDA AT 0838 ON [id number] BY S DALTON CRITICAL RESULT CALLED TO, READ BACK BY AND VERIFIED WITH: PHARMD STEVEN HURTH ON 10/09/23 @ 1617 BY DRT Performed at [facility], [address] [place], [place] [address]    Culture CITROBACTER FREUNDII (A)  Final   Report Status 10/11/2023 FINAL  Final   Organism ID, Bacteria CITROBACTER FREUNDII  Final      Susceptibility   Citrobacter freundii - MIC*    CEFEPIME <=0.12 SENSITIVE Sensitive     CEFTAZIDIME <=1 SENSITIVE Sensitive     CEFTRIAXONE <=0.25 SENSITIVE Sensitive     CIPROFLOXACIN <=0.25 SENSITIVE Sensitive     GENTAMICIN <=1 SENSITIVE Sensitive     IMIPENEM <=0.25 SENSITIVE Sensitive     TRIMETH/SULFA <=20 SENSITIVE Sensitive     PIP/TAZO <=4 SENSITIVE Sensitive ug/mL    * CITROBACTER FREUNDII  Blood Culture (routine x 2)     Status: Abnormal   Collection Time: 10/08/23  4:14 PM   Specimen: BLOOD RIGHT ARM  Result Value Ref Range Status   Specimen Description   Final    BLOOD RIGHT ARM BOTTLES DRAWN AEROBIC AND ANAEROBIC Performed at [facility], [address], [place], [place] [address]    Special Requests   Final    Blood Culture adequate volume Performed at [facility], [address]  [place], [place] [address]    Culture  Setup Time   Final    GRAM NEGATIVE RODS IN BOTH AEROBIC AND ANAEROBIC BOTTLES Gram Stain Report Called to,Read Back By and Verified With: R TEJEDA AT 0838 ON [id number] BY S DALTON CRITICAL RESULT CALLED TO, READ BACK BY AND VERIFIED WITH: PHARMD STEVEN HURTH ON 10/09/23 @ 1617 BY DRT    Culture (A)  Final    CITROBACTER FREUNDII SUSCEPTIBILITIES PERFORMED ON PREVIOUS CULTURE WITHIN THE LAST 5 DAYS. Performed at [facility], [address] [place], [place] [address]    Report Status 10/11/2023 FINAL  Final  Blood Culture ID Panel (Reflexed)     Status: Abnormal   Collection Time: 10/08/23  4:14 PM  Result Value Ref Range Status   Enterococcus faecalis NOT DETECTED NOT DETECTED Final   Enterococcus Faecium NOT DETECTED NOT DETECTED Final   Listeria monocytogenes NOT DETECTED NOT DETECTED Final   Staphylococcus species NOT DETECTED NOT DETECTED Final   Staphylococcus aureus (BCID) NOT DETECTED NOT DETECTED Final   Staphylococcus epidermidis NOT DETECTED NOT DETECTED Final   Staphylococcus lugdunensis NOT DETECTED NOT DETECTED Final   Streptococcus species NOT DETECTED NOT DETECTED Final   Streptococcus agalactiae NOT DETECTED NOT DETECTED Final   Streptococcus pneumoniae NOT DETECTED NOT DETECTED Final   Streptococcus pyogenes NOT DETECTED NOT DETECTED Final   A.calcoaceticus-baumannii NOT DETECTED NOT DETECTED Final   Bacteroides fragilis NOT DETECTED NOT DETECTED Final   Enterobacterales DETECTED (A) NOT DETECTED Final    Comment: Enterobacterales represent a large order of gram negative bacteria, not a single organism. Refer to culture for further identification. CRITICAL RESULT CALLED TO, READ BACK BY AND VERIFIED WITH: PHARMD STEVEN HURTH ON 10/09/23 @ 1617 BY DRT    Enterobacter cloacae complex NOT DETECTED NOT DETECTED Final   Escherichia coli NOT DETECTED NOT DETECTED Final   Klebsiella aerogenes NOT DETECTED NOT DETECTED Final    Klebsiella oxytoca NOT DETECTED NOT DETECTED Final   Klebsiella pneumoniae NOT DETECTED NOT DETECTED Final  Proteus species NOT DETECTED NOT DETECTED Final   Salmonella species NOT DETECTED NOT DETECTED Final   Serratia marcescens NOT DETECTED NOT DETECTED Final   Haemophilus influenzae NOT DETECTED NOT DETECTED Final   Neisseria meningitidis NOT DETECTED NOT DETECTED Final   Pseudomonas aeruginosa NOT DETECTED NOT DETECTED Final   Stenotrophomonas maltophilia NOT DETECTED NOT DETECTED Final   Candida albicans NOT DETECTED NOT DETECTED Final   Candida auris NOT DETECTED NOT DETECTED Final   Candida glabrata NOT DETECTED NOT DETECTED Final   Candida krusei NOT DETECTED NOT DETECTED Final   Candida parapsilosis NOT DETECTED NOT DETECTED Final   Candida tropicalis NOT DETECTED NOT DETECTED Final   Cryptococcus neoformans/gattii NOT DETECTED NOT DETECTED Final   CTX-M ESBL NOT DETECTED NOT DETECTED Final   Carbapenem resistance IMP NOT DETECTED NOT DETECTED Final   Carbapenem resistance KPC NOT DETECTED NOT DETECTED Final   Carbapenem resistance NDM NOT DETECTED NOT DETECTED Final   Carbapenem resist OXA 48 LIKE NOT DETECTED NOT DETECTED Final   Carbapenem resistance VIM NOT DETECTED NOT DETECTED Final    Comment: Performed at [facility], [address], [place], [place] [address]  Resp panel by RT-PCR (RSV, Flu A&B, Covid) Anterior Nasal Swab     Status: None   Collection Time: 10/08/23  4:19 PM   Specimen: Anterior Nasal Swab  Result Value Ref Range Status   SARS Coronavirus 2 by RT PCR NEGATIVE NEGATIVE Final    Comment: (NOTE) SARS-CoV-2 target nucleic acids are NOT DETECTED.  The SARS-CoV-2 RNA is generally detectable in upper respiratory specimens during the acute phase of infection. The lowest concentration of SARS-CoV-2 viral copies this assay can detect is 138 copies/mL. A negative result does not preclude SARS-Cov-2 infection and should not be used as the sole basis  for treatment or other patient management decisions. A negative result may occur with  improper specimen collection/handling, submission of specimen other than nasopharyngeal swab, presence of viral mutation(s) within the areas targeted by this assay, and inadequate number of viral copies(<138 copies/mL). A negative result must be combined with clinical observations, patient history, and epidemiological information. The expected result is Negative.  Fact Sheet for Patients:  BloggerCourse.com  Fact Sheet for Healthcare Providers:  SeriousBroker.it  This test is no t yet approved or cleared by the [place] FDA and  has been authorized for detection and/or diagnosis of SARS-CoV-2 by FDA under an Emergency Use Authorization (EUA). This EUA will remain  in effect (meaning this test can be used) for the duration of the COVID-19 declaration under Section 564(b)(1) of the Act, 21 U.S.C.section 360bbb-3(b)(1), unless the authorization is terminated  or revoked sooner.       Influenza A by PCR NEGATIVE NEGATIVE Final   Influenza B by PCR NEGATIVE NEGATIVE Final    Comment: (NOTE) The Xpert Xpress SARS-CoV-2/FLU/RSV plus assay is intended as an aid in the diagnosis of influenza from Nasopharyngeal swab specimens and should not be used as a sole basis for treatment. Nasal washings and aspirates are unacceptable for Xpert Xpress SARS-CoV-2/FLU/RSV testing.  Fact Sheet for Patients: BloggerCourse.com  Fact Sheet for Healthcare Providers: SeriousBroker.it  This test is not yet approved or cleared by the [place] FDA and has been authorized for detection and/or diagnosis of SARS-CoV-2 by FDA under an Emergency Use Authorization (EUA). This EUA will remain in effect (meaning this test can be used) for the duration of the COVID-19 declaration under Section 564(b)(1) of the Act, 21  U.S.C. section 360bbb-3(b)(1), unless the authorization is terminated or revoked.     Resp Syncytial Virus by PCR NEGATIVE NEGATIVE Final    Comment: (NOTE) Fact Sheet for Patients: BloggerCourse.com  Fact Sheet for Healthcare Providers: SeriousBroker.it  This test is not yet approved or cleared by the [place] FDA and has been authorized for detection and/or diagnosis of SARS-CoV-2 by FDA under an Emergency Use Authorization (EUA). This EUA will remain in effect (meaning this test can be used) for the duration of the COVID-19 declaration under Section 564(b)(1) of the Act, 21 U.S.C. section 360bbb-3(b)(1), unless the authorization is terminated or revoked.  Performed at [facility], [address], [place], [place] [address]   Urine Culture (for pregnant, neutropenic or urologic patients or patients with an indwelling urinary catheter)     Status: None   Collection Time: 10/09/23  5:00 AM   Specimen: In/Out Cath Urine  Result Value Ref Range Status   Specimen Description   Final    IN/OUT CATH URINE Performed at [facility], [address], [place], [place] [address]    Special Requests   Final    NONE Performed at [facility], [address], [place], [place] [address]    Culture   Final    NO GROWTH Performed at [facility], [address] [place], [place] [address]    Report Status 10/10/2023 FINAL  Final    Radiology Studies: No results found.  Scheduled Meds:  citalopram  20 mg Oral Daily   enoxaparin (LOVENOX) injection  40 mg Subcutaneous Q24H   insulin aspart  0-5 Units Subcutaneous QHS   insulin aspart  0-6 Units Subcutaneous TID WC   pantoprazole  40 mg Oral Daily   Continuous Infusions:  meropenem (MERREM) IV 1 g (10/11/23 0902)    LOS: 3 days   Vassie Loll M.D on 10/11/2023 at 4:59 PM  Go to [email or web address] - for contact info  [facility] - Office  [phone]  If 7PM-7AM,  please contact night-coverage [email or web address] 10/11/2023, 4:59 PM

## 2023-10-12 DIAGNOSIS — E876 Hypokalemia: Secondary | ICD-10-CM | POA: Diagnosis not present

## 2023-10-12 DIAGNOSIS — N390 Urinary tract infection, site not specified: Secondary | ICD-10-CM | POA: Diagnosis not present

## 2023-10-12 DIAGNOSIS — A419 Sepsis, unspecified organism: Secondary | ICD-10-CM | POA: Diagnosis not present

## 2023-10-12 DIAGNOSIS — I10 Essential (primary) hypertension: Secondary | ICD-10-CM | POA: Diagnosis not present

## 2023-10-12 LAB — GLUCOSE, CAPILLARY
Glucose-Capillary: 174 mg/dL — ABNORMAL HIGH (ref 70–99)
Glucose-Capillary: 244 mg/dL — ABNORMAL HIGH (ref 70–99)
Glucose-Capillary: 317 mg/dL — ABNORMAL HIGH (ref 70–99)
Glucose-Capillary: 468 mg/dL — ABNORMAL HIGH (ref 70–99)

## 2023-10-12 MED ORDER — INSULIN ASPART 100 UNIT/ML IJ SOLN
8.0000 [IU] | Freq: Once | INTRAMUSCULAR | Status: AC
  Administered 2023-10-12: 8 [IU] via SUBCUTANEOUS

## 2023-10-12 NOTE — [place] FL2 (Signed)
[place] MEDICAID FL2 LEVEL OF CARE FORM     IDENTIFICATION  Patient Name: Dana Ellis Birthdate: 29-Sep-1934 Sex: female Admission Date (Current Location): 10/08/2023  [facility] and [place] Number:  [employer] and Address:  [facility],  [address] [place] [address]      Provider Number: [id number]  Attending Physician Name and Address:  Vassie Loll, MD  Relative Name and Phone Number:       Current Level of Care: Hospital Recommended Level of Care: Skilled Nursing Facility Prior Approval Number:    Date Approved/Denied:   PASRR Number: [id number]  Discharge Plan: SNF    Current Diagnoses: Patient Active Problem List   Diagnosis Date Noted   Complicated UTI (urinary tract infection) 10/08/2023   Hypokalemia 10/08/2023   Severe sepsis (HCC) 10/08/2023   Acute metabolic encephalopathy 10/08/2023   Atrial fibrillation (HCC) 10/08/2023   Hypoglycemia associated with diabetes (HCC) 10/08/2023   Atrophic vaginitis 10/06/2023   Mixed stress and urge urinary incontinence 10/06/2023   Recurrent UTI 10/06/2023   Multiple drug resistant organism (MDRO) culture positive 10/06/2023   T2DM (type 2 diabetes mellitus) (HCC) 09/26/2023   Seizure disorder (HCC) 09/26/2023   HLD (hyperlipidemia) 09/26/2023   CKD (chronic kidney disease) 09/26/2023   Olecranon bursitis of left elbow 03/26/2013   Left elbow pain 03/26/2013   BENIGN POSITIONAL VERTIGO 07/13/2009   PERIPHERAL EDEMA 02/24/2009   GERD 09/17/2007   GOITER, MULTINODULAR 03/06/2007   OBESITY NOS 11/29/2006   Anxiety state 11/29/2006   DEPRESSION 11/29/2006   Essential hypertension 11/29/2006   Allergic rhinitis 11/29/2006   Osteoarthritis 11/29/2006   DEGENERATION, DISC NOS 11/29/2006    Orientation RESPIRATION BLADDER Height & Weight     Self  Normal External catheter Weight: 161 lb 14.4 oz (73.4 kg) Height:  5\' 4"  (162.6 cm)  BEHAVIORAL SYMPTOMS/MOOD NEUROLOGICAL BOWEL  NUTRITION STATUS      Incontinent Diet (See d/c summary)  AMBULATORY STATUS COMMUNICATION OF NEEDS Skin   Extensive Assist Verbally Normal                       Personal Care Assistance Level of Assistance  Bathing, Feeding, Dressing Bathing Assistance: Maximum assistance Feeding assistance: Limited assistance Dressing Assistance: Maximum assistance     Functional Limitations Info  Sight, Hearing, Speech Sight Info: Impaired Hearing Info: Adequate Speech Info: Adequate    SPECIAL CARE FACTORS FREQUENCY  PT (By licensed PT)     PT Frequency: 5x weekly              Contractures      Additional Factors Info  Code Status, Allergies Code Status Info: Full code Allergies Info: Ace Inhibitors, Pork-derived Products           Current Medications (10/12/2023):  This is the current hospital active medication list Current Facility-Administered Medications  Medication Dose Route Frequency Provider Last Rate Last Admin   acetaminophen (TYLENOL) tablet 650 mg  650 mg Oral Q6H PRN Emokpae, Ejiroghene E, MD       Or   acetaminophen (TYLENOL) suppository 650 mg  650 mg Rectal Q6H PRN Emokpae, Ejiroghene E, MD       citalopram (CELEXA) tablet 20 mg  20 mg Oral Daily Emokpae, Ejiroghene E, MD   20 mg at 10/12/23 0846   enoxaparin (LOVENOX) injection 40 mg  40 mg Subcutaneous Q24H Emokpae, Ejiroghene E, MD   40 mg at 10/11/23 2211   insulin aspart (novoLOG)  injection 0-5 Units  0-5 Units Subcutaneous QHS Emokpae, Courage, MD       insulin aspart (novoLOG) injection 0-6 Units  0-6 Units Subcutaneous TID WC Emokpae, Courage, MD   1 Units at 10/12/23 0845   meropenem (MERREM) 1 g in sodium chloride 0.9 % 100 mL IVPB  1 g Intravenous Q12H Emokpae, Ejiroghene E, MD 200 mL/hr at 10/12/23 0841 1 g at 10/12/23 0841   ondansetron (ZOFRAN) tablet 4 mg  4 mg Oral Q6H PRN Emokpae, Ejiroghene E, MD       Or   ondansetron (ZOFRAN) injection 4 mg  4 mg Intravenous Q6H PRN Emokpae, Ejiroghene  E, MD   4 mg at 10/09/23 2141   pantoprazole (PROTONIX) EC tablet 40 mg  40 mg Oral Daily Emokpae, Ejiroghene E, MD   40 mg at 10/12/23 0846   polyethylene glycol (MIRALAX / GLYCOLAX) packet 17 g  17 g Oral Daily PRN Emokpae, Ejiroghene E, MD         Discharge Medications: Please see discharge summary for a list of discharge medications.  Relevant Imaging Results:  Relevant Lab Results:   Additional Information SSN: [id number]  Karn Cassis, LCSW

## 2023-10-12 NOTE — Progress Notes (Signed)
PROGRESS NOTE     Dana Ellis, is a 89 y.o. female, DOB - 10-Sep-1934, MVH:[id number]  Admit date - 10/08/2023   Admitting Physician Onnie Boer, MD  Outpatient Primary MD for the patient is Benita Stabile, MD  LOS - 4  Chief Complaint  Patient presents with   Hypoglycemia      Brief Narrative:   89 y.o. female with medical history significant for hypertension, diabetes mellitus, depression, dementia admitted on 10/08/2023 concern for recurrent UTI --Awaiting blood culture result from 10/08/2023 to decide on discharge antibiotic choice   -Assessment and Plan: 1)History of Recurrent MDR UTI Complicated UTI with severe sepsis.   -Further fevers --  Urine cultures 9/15 grew multidrug-resistant Klebsiella. -Blood cultures from 10/08/2023 with Enterobacterales group---final ID and sensitivity pending --Awaiting blood culture result from 10/08/2023 to decide on discharge antibiotic choice --c/n IV meropenem pending further culture data -Hopefully able to discharge home on adequate antibiotic therapy by mouth on 10/12/2023.  2)DM2- -Currently 6.4 reflecting excellent diabetic control PTA -Patient had hypoglycemia due to poor oral intake; continue to follow CBG fluctuation. -Continue to hold [place] -Continue sliding scale insulin.  3)Acute metabolic encephalopathy superimposed on underlying dementia -Suspect cognitive and mentation issues worsened due to dehydration and presumed UTI -Continue constant reorientation and continue treatment for UTI. -Patient advised to maintain adequate oral hydration.  4)HypoNatremia/Hypokalemia----in the setting of dehydration -Continue to maintain adequate hydration.  5)PAFib--not on rate controlling agents chronically -Not on anticoagulation PTA -Rate stable and controlled on telemetry evaluation.  6)HTN--- -continue to hold amlodipine and clonidine due to soft BP  7)GERD--- continue Protonix -Patient encouraged to request as  needed antiemetics.  8) physical deconditioning -Continue supportive care -PT recommending SNF -TOC aware and helping with placement.  Status is: Inpatient   Disposition: The patient is from: Home              Anticipated d/c is to: Home              Anticipated d/c date is: 1 day (10/12/2023              Patient currently is not medically stable to d/c.---- Barriers: Not Clinically Stable- ----Awaiting blood culture result from 10/08/2023 to decide on discharge antibiotic choice  Code Status :  -  Code Status: Full Code   Family Communication:  Discussed with patient's son and grandson  DVT Prophylaxis  :   - SCDs  enoxaparin (LOVENOX) injection 40 mg Start: 10/08/23 2200   Lab Results  Component Value Date   PLT 141 (L) 10/11/2023    Inpatient Medications  Scheduled Meds:  citalopram  20 mg Oral Daily   enoxaparin (LOVENOX) injection  40 mg Subcutaneous Q24H   insulin aspart  0-5 Units Subcutaneous QHS   insulin aspart  0-6 Units Subcutaneous TID WC   pantoprazole  40 mg Oral Daily   Continuous Infusions:  meropenem (MERREM) IV Stopped (10/12/23 1000)   PRN Meds:.acetaminophen **OR** acetaminophen, ondansetron **OR** ondansetron (ZOFRAN) IV, polyethylene glycol   Anti-infectives (From admission, onward)    Start     Dose/Rate Route Frequency Ordered Stop   10/08/23 1700  meropenem (MERREM) 1 g in sodium chloride 0.9 % 100 mL IVPB        1 g 200 mL/hr over 30 Minutes Intravenous Every 12 hours 10/08/23 1653     10/08/23 1545  cefTRIAXone (ROCEPHIN) 2 g in sodium chloride 0.9 % 100 mL IVPB  Status:  Discontinued  2 g 200 mL/hr over 30 Minutes Intravenous Every 24 hours 10/08/23 1541 10/08/23 1653        Subjective: Dana Ellis no fever, no chest pain, no nausea, no vomiting.  Continues to experience intermittent episode of confusion and disorientation.  Generally weak, deconditioned and after evaluation by physical therapy recommendations provided for  skilled nursing facility at discharge.  Objective: Vitals:   10/11/23 0337 10/11/23 1320 10/11/23 2136 10/12/23 1353  BP: (!) 159/88 (!) 170/99 (!) 157/90 (!) 154/97  Pulse: 72 83 76 79  Resp: 18 17  18   Temp: 98.4 F (36.9 C) 98 F (36.7 C) 98.7 F (37.1 C) 98.5 F (36.9 C)  TempSrc:   Oral   SpO2: 97% 97% 100% 100%  Weight:      Height:        Intake/Output Summary (Last 24 hours) at 10/12/2023 1833 Last data filed at 10/12/2023 1200 Gross per 24 hour  Intake 480 ml  Output 1000 ml  Net -520 ml   Filed Weights   10/08/23 1541  Weight: 73.4 kg    Physical Exam General exam: Alert, awake, oriented x 2; generally weak and deconditioned.  No fever, no nausea, no vomiting.  Reports no chest pain or shortness of breath. Respiratory system: Clear to auscultation. Respiratory effort normal.  Good saturation on room air. Cardiovascular system: Rate controlled.  No rubs or gallops; no JVD. Gastrointestinal system: Abdomen is nondistended, soft and nontender. No organomegaly or masses felt. Normal bowel sounds heard. Central nervous system: Moving 4 limbs spontaneously.  No focal neurological deficits. Extremities: No cyanosis or clubbing; no edema. Skin: No petechiae. Psychiatry: Judgement and insight appear slightly impaired secondary to underlying dementia and acute encephalopathic process.  Stable mood.  Data Reviewed: I have personally reviewed following labs and imaging studies  CBC: Recent Labs  Lab 10/08/23 1614 10/09/23 0532 10/11/23 0440  WBC 13.8* 22.9* 11.7*  NEUTROABS 11.5*  --   --   HGB 10.4* 9.8* 10.5*  HCT 31.9* 29.3* 31.2*  MCV 93.5 92.4 92.6  PLT 141* 130* 141*   Basic Metabolic Panel: Recent Labs  Lab 10/08/23 1614 10/09/23 0532 10/11/23 0440  NA 131* 132* 134*  K 2.5* 3.9 3.9  CL 98 103 106  CO2 22 23 22   GLUCOSE 59* 94 122*  BUN 23 23 23   CREATININE 0.98 0.97 0.99  CALCIUM 8.3* 7.8* 8.3*  MG 1.2*  --   --    GFR: Estimated  Creatinine Clearance: 39.3 mL/min (by C-G formula based on SCr of 0.99 mg/dL).  Liver Function Tests: Recent Labs  Lab 10/08/23 1614  AST 51*  ALT 38  ALKPHOS 89  BILITOT 0.7  PROT 6.4*  ALBUMIN 2.8*   Recent Results (from the past 240 hour(s))  Microscopic Examination     Status: Abnormal   Collection Time: 10/06/23  9:52 AM   Urine  Result Value Ref Range Status   WBC, UA >30 (A) 0 - 5 /hpf Final   RBC, Urine 3-10 (A) 0 - 2 /hpf Final   Epithelial Cells (non renal) 0-10 0 - 10 /hpf Final   Bacteria, UA Many (A) None seen/Few Final  Blood Culture (routine x 2)     Status: Abnormal   Collection Time: 10/08/23  4:14 PM   Specimen: BLOOD RIGHT HAND  Result Value Ref Range Status   Specimen Description   Final    BLOOD RIGHT HAND BOTTLES DRAWN AEROBIC AND ANAEROBIC Performed at [facility],  [address] [place], [place] [address]    Special Requests   Final    Blood Culture adequate volume Performed at [facility], [address] [place], [place] [address]    Culture  Setup Time   Final    GRAM NEGATIVE RODS IN BOTH AEROBIC AND ANAEROBIC BOTTLES Gram Stain Report Called to,Read Back By and Verified With: R TEJEDA AT 0838 ON [id number] BY S DALTON CRITICAL RESULT CALLED TO, READ BACK BY AND VERIFIED WITH: PHARMD STEVEN HURTH ON 10/09/23 @ 1617 BY DRT Performed at [facility], [address], [place], [place] [address]    Culture CITROBACTER FREUNDII (A)  Final   Report Status 10/11/2023 FINAL  Final   Organism ID, Bacteria CITROBACTER FREUNDII  Final      Susceptibility   Citrobacter freundii - MIC*    CEFEPIME <=0.12 SENSITIVE Sensitive     CEFTAZIDIME <=1 SENSITIVE Sensitive     CEFTRIAXONE <=0.25 SENSITIVE Sensitive     CIPROFLOXACIN <=0.25 SENSITIVE Sensitive     GENTAMICIN <=1 SENSITIVE Sensitive     IMIPENEM <=0.25 SENSITIVE Sensitive     TRIMETH/SULFA <=20 SENSITIVE Sensitive     PIP/TAZO <=4 SENSITIVE Sensitive ug/mL    * CITROBACTER FREUNDII  Blood  Culture (routine x 2)     Status: Abnormal   Collection Time: 10/08/23  4:14 PM   Specimen: BLOOD RIGHT ARM  Result Value Ref Range Status   Specimen Description   Final    BLOOD RIGHT ARM BOTTLES DRAWN AEROBIC AND ANAEROBIC Performed at [facility], [address], [place], [place] [address]    Special Requests   Final    Blood Culture adequate volume Performed at [facility], [address] [place], [place] [address]    Culture  Setup Time   Final    GRAM NEGATIVE RODS IN BOTH AEROBIC AND ANAEROBIC BOTTLES Gram Stain Report Called to,Read Back By and Verified With: R TEJEDA AT 0838 ON [id number] BY S DALTON CRITICAL RESULT CALLED TO, READ BACK BY AND VERIFIED WITH: PHARMD STEVEN HURTH ON 10/09/23 @ 1617 BY DRT    Culture (A)  Final    CITROBACTER FREUNDII SUSCEPTIBILITIES PERFORMED ON PREVIOUS CULTURE WITHIN THE LAST 5 DAYS. Performed at [facility], [address] [place], [place] [address]    Report Status 10/11/2023 FINAL  Final  Blood Culture ID Panel (Reflexed)     Status: Abnormal   Collection Time: 10/08/23  4:14 PM  Result Value Ref Range Status   Enterococcus faecalis NOT DETECTED NOT DETECTED Final   Enterococcus Faecium NOT DETECTED NOT DETECTED Final   Listeria monocytogenes NOT DETECTED NOT DETECTED Final   Staphylococcus species NOT DETECTED NOT DETECTED Final   Staphylococcus aureus (BCID) NOT DETECTED NOT DETECTED Final   Staphylococcus epidermidis NOT DETECTED NOT DETECTED Final   Staphylococcus lugdunensis NOT DETECTED NOT DETECTED Final   Streptococcus species NOT DETECTED NOT DETECTED Final   Streptococcus agalactiae NOT DETECTED NOT DETECTED Final   Streptococcus pneumoniae NOT DETECTED NOT DETECTED Final   Streptococcus pyogenes NOT DETECTED NOT DETECTED Final   A.calcoaceticus-baumannii NOT DETECTED NOT DETECTED Final   Bacteroides fragilis NOT DETECTED NOT DETECTED Final   Enterobacterales DETECTED (A) NOT DETECTED Final    Comment:  Enterobacterales represent a large order of gram negative bacteria, not a single organism. Refer to culture for further identification. CRITICAL RESULT CALLED TO, READ BACK BY AND VERIFIED WITH: PHARMD STEVEN HURTH ON 10/09/23 @ 1617 BY DRT  Enterobacter cloacae complex NOT DETECTED NOT DETECTED Final   Escherichia coli NOT DETECTED NOT DETECTED Final   Klebsiella aerogenes NOT DETECTED NOT DETECTED Final   Klebsiella oxytoca NOT DETECTED NOT DETECTED Final   Klebsiella pneumoniae NOT DETECTED NOT DETECTED Final   Proteus species NOT DETECTED NOT DETECTED Final   Salmonella species NOT DETECTED NOT DETECTED Final   Serratia marcescens NOT DETECTED NOT DETECTED Final   Haemophilus influenzae NOT DETECTED NOT DETECTED Final   Neisseria meningitidis NOT DETECTED NOT DETECTED Final   Pseudomonas aeruginosa NOT DETECTED NOT DETECTED Final   Stenotrophomonas maltophilia NOT DETECTED NOT DETECTED Final   Candida albicans NOT DETECTED NOT DETECTED Final   Candida auris NOT DETECTED NOT DETECTED Final   Candida glabrata NOT DETECTED NOT DETECTED Final   Candida krusei NOT DETECTED NOT DETECTED Final   Candida parapsilosis NOT DETECTED NOT DETECTED Final   Candida tropicalis NOT DETECTED NOT DETECTED Final   Cryptococcus neoformans/gattii NOT DETECTED NOT DETECTED Final   CTX-M ESBL NOT DETECTED NOT DETECTED Final   Carbapenem resistance IMP NOT DETECTED NOT DETECTED Final   Carbapenem resistance KPC NOT DETECTED NOT DETECTED Final   Carbapenem resistance NDM NOT DETECTED NOT DETECTED Final   Carbapenem resist OXA 48 LIKE NOT DETECTED NOT DETECTED Final   Carbapenem resistance VIM NOT DETECTED NOT DETECTED Final    Comment: Performed at [facility], [address], [place], [place] [address]  Resp panel by RT-PCR (RSV, Flu A&B, Covid) Anterior Nasal Swab     Status: None   Collection Time: 10/08/23  4:19 PM   Specimen: Anterior Nasal Swab  Result Value Ref Range Status   SARS  Coronavirus 2 by RT PCR NEGATIVE NEGATIVE Final    Comment: (NOTE) SARS-CoV-2 target nucleic acids are NOT DETECTED.  The SARS-CoV-2 RNA is generally detectable in upper respiratory specimens during the acute phase of infection. The lowest concentration of SARS-CoV-2 viral copies this assay can detect is 138 copies/mL. A negative result does not preclude SARS-Cov-2 infection and should not be used as the sole basis for treatment or other patient management decisions. A negative result may occur with  improper specimen collection/handling, submission of specimen other than nasopharyngeal swab, presence of viral mutation(s) within the areas targeted by this assay, and inadequate number of viral copies(<138 copies/mL). A negative result must be combined with clinical observations, patient history, and epidemiological information. The expected result is Negative.  Fact Sheet for Patients:  BloggerCourse.com  Fact Sheet for Healthcare Providers:  SeriousBroker.it  This test is no t yet approved or cleared by the [place] FDA and  has been authorized for detection and/or diagnosis of SARS-CoV-2 by FDA under an Emergency Use Authorization (EUA). This EUA will remain  in effect (meaning this test can be used) for the duration of the COVID-19 declaration under Section 564(b)(1) of the Act, 21 U.S.C.section 360bbb-3(b)(1), unless the authorization is terminated  or revoked sooner.       Influenza A by PCR NEGATIVE NEGATIVE Final   Influenza B by PCR NEGATIVE NEGATIVE Final    Comment: (NOTE) The Xpert Xpress SARS-CoV-2/FLU/RSV plus assay is intended as an aid in the diagnosis of influenza from Nasopharyngeal swab specimens and should not be used as a sole basis for treatment. Nasal washings and aspirates are unacceptable for Xpert Xpress SARS-CoV-2/FLU/RSV testing.  Fact Sheet for  Patients: BloggerCourse.com  Fact Sheet for Healthcare Providers: SeriousBroker.it  This test is not yet approved or cleared by the  [place] [employer] trader and has been authorized for detection and/or diagnosis of SARS-CoV-2 by FDA under an TEFL teacher (EUA). This EUA will remain in effect (meaning this test can be used) for the duration of the COVID-19 declaration under Section 564(b)(1) of the Act, 21 U.S.C. section 360bbb-3(b)(1), unless the authorization is terminated or revoked.     Resp Syncytial Virus by PCR NEGATIVE NEGATIVE Final    Comment: (NOTE) Fact Sheet for Patients: BloggerCourse.com  Fact Sheet for Healthcare Providers: SeriousBroker.it  This test is not yet approved or cleared by the [place] FDA and has been authorized for detection and/or diagnosis of SARS-CoV-2 by FDA under an Emergency Use Authorization (EUA). This EUA will remain in effect (meaning this test can be used) for the duration of the COVID-19 declaration under Section 564(b)(1) of the Act, 21 U.S.C. section 360bbb-3(b)(1), unless the authorization is terminated or revoked.  Performed at [facility], [address], [place], [place] [address]   Urine Culture (for pregnant, neutropenic or urologic patients or patients with an indwelling urinary catheter)     Status: None   Collection Time: 10/09/23  5:00 AM   Specimen: In/Out Cath Urine  Result Value Ref Range Status   Specimen Description   Final    IN/OUT CATH URINE Performed at [facility], [address], [place], [place] [address]    Special Requests   Final    NONE Performed at [facility], [address] [place], [place] [address]    Culture   Final    NO GROWTH Performed at [facility], [address] [place], [place] [address]    Report Status 10/10/2023 FINAL  Final    Radiology Studies: No results  found.  Scheduled Meds:  citalopram  20 mg Oral Daily   enoxaparin (LOVENOX) injection  40 mg Subcutaneous Q24H   insulin aspart  0-5 Units Subcutaneous QHS   insulin aspart  0-6 Units Subcutaneous TID WC   pantoprazole  40 mg Oral Daily   Continuous Infusions:  meropenem (MERREM) IV Stopped (10/12/23 1000)    LOS: 4 days   Vassie Loll M.D on 10/12/2023 at 6:33 PM  Go to [email or web address] - for contact info  [facility] - Office  [phone]  If 7PM-7AM, please contact night-coverage [email or web address] 10/12/2023, 6:33 PM

## 2023-10-12 NOTE — TOC Initial Note (Signed)
Transition of Care [facility]) - Initial/Assessment Note    Patient Details  Name: Dana Ellis MRN: [id number] Date of Birth: 1934-05-26  [facility] CM/SW Contact:    Karn Cassis, LCSW Phone Number: 10/12/2023, 12:06 PM  Clinical Narrative:  Pt admitted due to UTI. Assessment completed with pt's son as pt oriented to self only per chart. Son indicates pt lives alone and he lives next door. She has been managing fairly well at home on her own. Pt ambulates with a walker at baseline. PT evaluated pt and recommend SNF. Discussed with son who is agreeable, requesting PNC. Will initiate bed search and start authorization. Son aware of Medicare.gov for ratings.                Expected Discharge Plan: Skilled Nursing Facility Barriers to Discharge: Continued Medical Work up   Patient Goals and CMS Choice Patient states their goals for this hospitalization and ongoing recovery are:: SNF   Choice offered to / list presented to : Adult Children [place] ownership interest [facility].provided to:: Adult Children    Expected Discharge Plan and Services In-house Referral: Clinical Social Work   Post Acute Care Choice: Skilled Nursing Facility Living arrangements for the past 2 months: Single Family Home                                      Prior Living Arrangements/Services Living arrangements for the past 2 months: Single Family Home Lives with:: Self Patient language and need for interpreter reviewed:: Yes Do you feel safe going back to the place where you live?: Yes      Need for Family Participation in Patient Care: Yes (Comment)   Current home services: DME (walker) Criminal Activity/Legal Involvement Pertinent to Current Situation/Hospitalization: No - Comment as needed  Activities of Daily Living      Permission Sought/Granted                  Emotional Assessment   Attitude/Demeanor/Rapport: Unable to Assess Affect  (typically observed): Unable to Assess Orientation: : Oriented to Self Alcohol / Substance Use: Not Applicable Psych Involvement: No (comment)  Admission diagnosis:  Hypokalemia [E87.6] Complicated UTI (urinary tract infection) [N39.0] Sepsis, due to unspecified organism, unspecified whether acute organ dysfunction present [facility]) [A41.9] Patient Active Problem List   Diagnosis Date Noted   Complicated UTI (urinary tract infection) 10/08/2023   Hypokalemia 10/08/2023   Severe sepsis (HCC) 10/08/2023   Acute metabolic encephalopathy 10/08/2023   Atrial fibrillation (HCC) 10/08/2023   Hypoglycemia associated with diabetes (HCC) 10/08/2023   Atrophic vaginitis 10/06/2023   Mixed stress and urge urinary incontinence 10/06/2023   Recurrent UTI 10/06/2023   Multiple drug resistant organism (MDRO) culture positive 10/06/2023   T2DM (type 2 diabetes mellitus) (HCC) 09/26/2023   Seizure disorder (HCC) 09/26/2023   HLD (hyperlipidemia) 09/26/2023   CKD (chronic kidney disease) 09/26/2023   Olecranon bursitis of left elbow 03/26/2013   Left elbow pain 03/26/2013   BENIGN POSITIONAL VERTIGO 07/13/2009   PERIPHERAL EDEMA 02/24/2009   GERD 09/17/2007   GOITER, MULTINODULAR 03/06/2007   OBESITY NOS 11/29/2006   Anxiety state 11/29/2006   DEPRESSION 11/29/2006   Essential hypertension 11/29/2006   Allergic rhinitis 11/29/2006   Osteoarthritis 11/29/2006   DEGENERATION, DISC NOS 11/29/2006   PCP:  Benita Stabile, MD Pharmacy:   [facility] [phone] - [place], [place] -  [address] AT [facility] [address] [place] [address] Phone: [phone] Fax: [phone]     Social Determinants of Health (SDOH) Social History: SDOH Screenings   Tobacco Use: Low Risk  (10/08/2023)   SDOH Interventions:     Readmission Risk Interventions     No data to display

## 2023-10-12 NOTE — Plan of Care (Signed)
  Problem: Acute Rehab PT Goals(only PT should resolve) Goal: Patient Will Transfer Sit To/From Stand Flowsheets (Taken 10/12/2023 1121) Patient will transfer sit to/from stand: with minimal assist Goal: Pt Will Transfer Bed To Chair/Chair To Bed Flowsheets (Taken 10/12/2023 1121) Pt will Transfer Bed to Chair/Chair to Bed: with min assist Goal: Pt Will Ambulate Flowsheets (Taken 10/12/2023 1121) Pt will Ambulate:  25 feet  with minimal assist  with rolling walker

## 2023-10-12 NOTE — Evaluation (Signed)
Physical Therapy Evaluation Patient Details Name: Dana Ellis MRN: [id number] DOB: 1934/10/10 Today's Date: 10/12/2023  History of Present Illness  Per UX:LKGMWNU Dana Ellis is a 89 y.o. female with medical history significant for hypertension, diabetes mellitus, depression, dementia.  Patient was brought to the ED from home with reports of altered mental status and low blood glucose, also reported new diagnosis of early dementia.  At the time of my evaluation patient is awake and alert, oriented to person only, able to answer simple questions.  Patient's son who lives close to patient, but the patient has been confused, talking " nonsense, and weak.  She has a history of recurrent urinary tract infections.  This morning patient's son reported that patient usually gets up at 9:30 in the morning, but today she was in bed with rigors and she was confused and too weak to get up.  Clinical Impression  Pt is I in bed mobility but due to fear and weakness needs mod assist for transfers and unable to ambulate at this time.         If plan is discharge home, recommend the following: A lot of help with walking and/or transfers;A lot of help with bathing/dressing/bathroom;Assistance with cooking/housework;Help with stairs or ramp for entrance   Can travel by private vehicle   Yes    Equipment Recommendations None recommended by PT  Recommendations for Other Services       Functional Status Assessment Patient has had a recent decline in their functional status and demonstrates the ability to make significant improvements in function in a reasonable and predictable amount of time.     Precautions / Restrictions Precautions Precautions: None Restrictions Weight Bearing Restrictions: No      Mobility  Bed Mobility Overal bed mobility: Modified Independent Bed Mobility: Supine to Sit                Transfers Overall transfer level: Needs assistance Equipment used: Rolling walker (2  wheels) Transfers: Sit to/from Stand, Bed to chair/wheelchair/BSC Sit to Stand: Max assist, Mod assist   Step pivot transfers: Mod assist            Ambulation/Gait   Gait Distance (Feet): 3 Feet Assistive device: Rolling walker (2 wheels) Gait Pattern/deviations: Decreased step length - right, Decreased step length - left Gait velocity: slow and fearful Gait velocity interpretation: <1.31 ft/sec, indicative of household ambulator           Pertinent Vitals/Pain Pain Assessment Pain Assessment: No/denies pain    Home Living Family/patient expects to be discharged to:: Private residence Living Arrangements: Alone Available Help at Discharge: Family;Available PRN/intermittently Type of Home: House Home Access: Stairs to enter Entrance Stairs-Rails: Right Entrance Stairs-Number of Steps: 5   Home Layout: One level Home Equipment: Rollator (4 wheels)      Prior Function Prior Level of Function : Needs assist               ADLs Comments: family takes care of the yard, housework and cleaning.     Extremity/Trunk Assessment        Lower Extremity Assessment Lower Extremity Assessment: Generalized weakness    Cervical / Trunk Assessment Cervical / Trunk Assessment: Kyphotic  Communication   Communication Communication: No apparent difficulties Cueing Techniques: Verbal cues  Cognition Arousal: Alert Behavior During Therapy: WFL for tasks assessed/performed Overall Cognitive Status: Within Functional Limits for tasks assessed  Assessment/Plan    PT Assessment Patient needs continued PT services  PT Problem List Decreased strength;Decreased activity tolerance;Decreased balance;Decreased mobility       PT Treatment Interventions Gait training;Therapeutic exercise;Therapeutic activities;Patient/family education;DME instruction (Pt kept reaching for objects rather than keeping her  hands on her walker)    PT Goals (Current goals can be found in the Care Plan section)  Acute Rehab PT Goals Patient Stated Goal: To go home PT Goal Formulation: With patient Time For Goal Achievement: 10/17/23 Potential to Achieve Goals: Fair    Frequency Min 3X/week        AM-PAC PT "6 Clicks" Mobility  Outcome Measure Help needed turning from your back to your side while in a flat bed without using bedrails?: None Help needed moving from lying on your back to sitting on the side of a flat bed without using bedrails?: None Help needed moving to and from a bed to a chair (including a wheelchair)?: A Lot Help needed standing up from a chair using your arms (e.g., wheelchair or bedside chair)?: A Lot Help needed to walk in hospital room?: A Lot Help needed climbing 3-5 steps with a railing? : Total 6 Click Score: 15    End of Session Equipment Utilized During Treatment: Gait belt Activity Tolerance: Other (comment) (limited by fear) Patient left: in chair;with call bell/phone within reach Nurse Communication: Mobility status PT Visit Diagnosis: Unsteadiness on feet (R26.81);Muscle weakness (generalized) (M62.81);Difficulty in walking, not elsewhere classified (R26.2)    Time: 1045-1010 PT Time Calculation (min) (ACUTE ONLY): 1405 min   Charges:   PT Evaluation $PT Eval Low Complexity: 1 Low   PT General Charges $$ ACUTE PT VISIT: 1 Visit          Virgina Organ, PT CLT [phone]  10/12/2023, 11:18 AM

## 2023-10-13 DIAGNOSIS — I10 Essential (primary) hypertension: Secondary | ICD-10-CM | POA: Diagnosis not present

## 2023-10-13 DIAGNOSIS — E876 Hypokalemia: Secondary | ICD-10-CM | POA: Diagnosis not present

## 2023-10-13 DIAGNOSIS — N390 Urinary tract infection, site not specified: Secondary | ICD-10-CM | POA: Diagnosis not present

## 2023-10-13 DIAGNOSIS — A419 Sepsis, unspecified organism: Secondary | ICD-10-CM | POA: Diagnosis not present

## 2023-10-13 LAB — GLUCOSE, CAPILLARY
Glucose-Capillary: 90 mg/dL (ref 70–99)
Glucose-Capillary: 137 mg/dL — ABNORMAL HIGH (ref 70–99)
Glucose-Capillary: 199 mg/dL — ABNORMAL HIGH (ref 70–99)
Glucose-Capillary: 206 mg/dL — ABNORMAL HIGH (ref 70–99)

## 2023-10-13 MED ORDER — HYDRALAZINE HCL 20 MG/ML IJ SOLN
10.0000 mg | Freq: Three times a day (TID) | INTRAMUSCULAR | Status: DC | PRN
  Administered 2023-10-14: 10 mg via INTRAVENOUS
  Filled 2023-10-13: qty 1

## 2023-10-13 MED ORDER — SULFAMETHOXAZOLE-TRIMETHOPRIM 800-160 MG PO TABS
1.0000 | ORAL_TABLET | Freq: Two times a day (BID) | ORAL | Status: AC
  Administered 2023-10-13 – 2023-10-16 (×6): 1 via ORAL
  Filled 2023-10-13 (×6): qty 1

## 2023-10-13 NOTE — Progress Notes (Signed)
PT Cancellation Note  Patient Details Name: Dana Ellis MRN: [id number] DOB: 1934/10/17   Cancelled Treatment:     Therapist checked on pt multiple times throughout the morning.  Pt having multiple bouel movements that she is unable to control and requests to skip therapy.  Virgina Organ, PT CLT [phone]  10/13/2023, 10:52 AM

## 2023-10-13 NOTE — Progress Notes (Signed)
PROGRESS NOTE     Dana Ellis, is a 89 y.o. female, DOB - 1934/08/12, XBJ:[id number]  Admit date - 10/08/2023   Admitting Physician Onnie Boer, MD  Outpatient Primary MD for the patient is Benita Stabile, MD  LOS - 5  Chief Complaint  Patient presents with   Hypoglycemia      Brief Narrative:   89 y.o. female with medical history significant for hypertension, diabetes mellitus, depression, dementia admitted on 10/08/2023 concern for recurrent UTI --Awaiting blood culture result from 10/08/2023 to decide on discharge antibiotic choice   -Assessment and Plan: 1)History of Recurrent MDR UTI Complicated UTI with severe sepsis.   -Further fevers --  Urine cultures 9/15 grew multidrug-resistant Klebsiella. -Blood cultures from 10/08/2023 with Enterobacterales group-demonstrating Citrobacter frundei --Awaiting blood culture result from 10/08/2023 to decide on discharge antibiotic choice -ID service has been consulted; after 5 days of meropenem and antibiotics will be transitioned to oral Bactrim double strength twice a day x 3 days. -Hopefully able to be discharged to skilled facility in the next 24 to 48 hours.  2)DM2- -Currently 6.4 reflecting excellent diabetic control PTA -Patient had hypoglycemia due to poor oral intake; continue to follow CBG fluctuation. -Continue to hold [place] -Continue sliding scale insulin.  3)Acute metabolic encephalopathy superimposed on underlying dementia -Suspect cognitive and mentation issues worsened due to dehydration and presumed UTI -Continue constant reorientation and continue treatment for UTI. -Patient advised to maintain adequate oral hydration.  4)HypoNatremia/Hypokalemia----in the setting of dehydration -Continue to maintain adequate hydration.  5)PAFib--not on rate controlling agents chronically -Not on anticoagulation PTA -Rate stable and controlled on telemetry evaluation.  6)HTN--- -continue to hold amlodipine and  clonidine due to soft BP  7)GERD--- continue Protonix -Patient encouraged to request as needed antiemetics.  8) physical deconditioning -Continue supportive care -PT recommending SNF -TOC aware and helping with placement.  Status is: Inpatient   Disposition: The patient is from: Home              Anticipated d/c is to: Home              Anticipated d/c date is: 1-2 days pending insurance authorization for SNF.              Patient currently is medically stable to d/c.----  Barriers: Not Clinically Stable- ---blood culture demonstrating positive Citrobacter frundei; ID consultation appreciated recommendations to treat Bactrim twice a day x 3 more days following sensitivity after completing 5 days of IV meropenem.  Code Status :  -  Code Status: Full Code   Family Communication:  Discussed with patient's son and grandson  DVT Prophylaxis  :   - SCDs  enoxaparin (LOVENOX) injection 40 mg Start: 10/08/23 2200   Lab Results  Component Value Date   PLT 141 (L) 10/11/2023    Inpatient Medications  Scheduled Meds:  citalopram  20 mg Oral Daily   enoxaparin (LOVENOX) injection  40 mg Subcutaneous Q24H   insulin aspart  0-5 Units Subcutaneous QHS   insulin aspart  0-6 Units Subcutaneous TID WC   pantoprazole  40 mg Oral Daily   Continuous Infusions:  meropenem (MERREM) IV 1 g (10/13/23 0817)   PRN Meds:.acetaminophen **OR** acetaminophen, hydrALAZINE, ondansetron **OR** ondansetron (ZOFRAN) IV, polyethylene glycol   Anti-infectives (From admission, onward)    Start     Dose/Rate Route Frequency Ordered Stop   10/08/23 1700  meropenem (MERREM) 1 g in sodium chloride 0.9 % 100 mL IVPB  1 g 200 mL/hr over 30 Minutes Intravenous Every 12 hours 10/08/23 1653     10/08/23 1545  cefTRIAXone (ROCEPHIN) 2 g in sodium chloride 0.9 % 100 mL IVPB  Status:  Discontinued        2 g 200 mL/hr over 30 Minutes Intravenous Every 24 hours 10/08/23 1541 10/08/23 1653         Subjective: Dana Ellis no fever, no chest pain, no nausea, no vomiting.  Generally weak and deconditioned.  Overall feeling better and more interactive and appropriate on today's examination.  Objective: Vitals:   10/12/23 1353 10/12/23 2150 10/13/23 0600 10/13/23 1417  BP: (!) 154/97 (!) 172/83 (!) 196/89 (!) 186/110  Pulse: 79 78 69 84  Resp: 18 18 18 18   Temp: 98.5 F (36.9 C) 98.5 F (36.9 C) 98.5 F (36.9 C) 100.1 F (37.8 C)  TempSrc:  Oral Oral Oral  SpO2: 100% 100% 94% 98%  Weight:      Height:        Intake/Output Summary (Last 24 hours) at 10/13/2023 1649 Last data filed at 10/13/2023 1100 Gross per 24 hour  Intake 480 ml  Output 901 ml  Net -421 ml   Filed Weights   10/08/23 1541  Weight: 73.4 kg    Physical Exam General exam: Alert, awake, oriented x 2; afebrile, no nausea, no vomiting, no abdominal pain.  Patient denies dysuria. Respiratory system: Clear to auscultation. Respiratory effort normal.  Good saturation on room air.  No using accessory muscles. Cardiovascular system:RRR. No rubs or gallops; no JVD. Gastrointestinal system: Abdomen is nondistended, soft and nontender. No organomegaly or masses felt. Normal bowel sounds heard. Central nervous system: General awake, moving 4 limbs spontaneously.  No focal neurological deficits. Extremities: No cyanosis or clubbing. Skin: No petechiae. Psychiatry: Mood and affect appears to be appropriate.  Data Reviewed: I have personally reviewed following labs and imaging studies  CBC: Recent Labs  Lab 10/08/23 1614 10/09/23 0532 10/11/23 0440  WBC 13.8* 22.9* 11.7*  NEUTROABS 11.5*  --   --   HGB 10.4* 9.8* 10.5*  HCT 31.9* 29.3* 31.2*  MCV 93.5 92.4 92.6  PLT 141* 130* 141*   Basic Metabolic Panel: Recent Labs  Lab 10/08/23 1614 10/09/23 0532 10/11/23 0440  NA 131* 132* 134*  K 2.5* 3.9 3.9  CL 98 103 106  CO2 22 23 22   GLUCOSE 59* 94 122*  BUN 23 23 23   CREATININE 0.98 0.97 0.99   CALCIUM 8.3* 7.8* 8.3*  MG 1.2*  --   --    GFR: Estimated Creatinine Clearance: 39.3 mL/min (by C-G formula based on SCr of 0.99 mg/dL).  Liver Function Tests: Recent Labs  Lab 10/08/23 1614  AST 51*  ALT 38  ALKPHOS 89  BILITOT 0.7  PROT 6.4*  ALBUMIN 2.8*   Recent Results (from the past 240 hour(s))  Microscopic Examination     Status: Abnormal   Collection Time: 10/06/23  9:52 AM   Urine  Result Value Ref Range Status   WBC, UA >30 (A) 0 - 5 /hpf Final   RBC, Urine 3-10 (A) 0 - 2 /hpf Final   Epithelial Cells (non renal) 0-10 0 - 10 /hpf Final   Bacteria, UA Many (A) None seen/Few Final  Blood Culture (routine x 2)     Status: Abnormal   Collection Time: 10/08/23  4:14 PM   Specimen: BLOOD RIGHT HAND  Result Value Ref Range Status   Specimen Description   Final  BLOOD RIGHT HAND BOTTLES DRAWN AEROBIC AND ANAEROBIC Performed at [facility], [address], [place], [place] [address]    Special Requests   Final    Blood Culture adequate volume Performed at [facility], [address], [place], [place] [address]    Culture  Setup Time   Final    GRAM NEGATIVE RODS IN BOTH AEROBIC AND ANAEROBIC BOTTLES Gram Stain Report Called to,Read Back By and Verified With: R TEJEDA AT 0838 ON [id number] BY S DALTON CRITICAL RESULT CALLED TO, READ BACK BY AND VERIFIED WITH: PHARMD STEVEN HURTH ON 10/09/23 @ 1617 BY DRT Performed at [facility], [address], [place], [place] [address]    Culture CITROBACTER FREUNDII (A)  Final   Report Status 10/11/2023 FINAL  Final   Organism ID, Bacteria CITROBACTER FREUNDII  Final      Susceptibility   Citrobacter freundii - MIC*    CEFEPIME <=0.12 SENSITIVE Sensitive     CEFTAZIDIME <=1 SENSITIVE Sensitive     CEFTRIAXONE <=0.25 SENSITIVE Sensitive     CIPROFLOXACIN <=0.25 SENSITIVE Sensitive     GENTAMICIN <=1 SENSITIVE Sensitive     IMIPENEM <=0.25 SENSITIVE Sensitive     TRIMETH/SULFA <=20 SENSITIVE Sensitive     PIP/TAZO  <=4 SENSITIVE Sensitive ug/mL    * CITROBACTER FREUNDII  Blood Culture (routine x 2)     Status: Abnormal   Collection Time: 10/08/23  4:14 PM   Specimen: BLOOD RIGHT ARM  Result Value Ref Range Status   Specimen Description   Final    BLOOD RIGHT ARM BOTTLES DRAWN AEROBIC AND ANAEROBIC Performed at [facility], [address], [place], [place] [address]    Special Requests   Final    Blood Culture adequate volume Performed at [facility], [address], [place], [place] [address]    Culture  Setup Time   Final    GRAM NEGATIVE RODS IN BOTH AEROBIC AND ANAEROBIC BOTTLES Gram Stain Report Called to,Read Back By and Verified With: R TEJEDA AT 0838 ON [id number] BY S DALTON CRITICAL RESULT CALLED TO, READ BACK BY AND VERIFIED WITH: PHARMD STEVEN HURTH ON 10/09/23 @ 1617 BY DRT    Culture (A)  Final    CITROBACTER FREUNDII SUSCEPTIBILITIES PERFORMED ON PREVIOUS CULTURE WITHIN THE LAST 5 DAYS. Performed at [facility], [address], [place], [place] [address]    Report Status 10/11/2023 FINAL  Final  Blood Culture ID Panel (Reflexed)     Status: Abnormal   Collection Time: 10/08/23  4:14 PM  Result Value Ref Range Status   Enterococcus faecalis NOT DETECTED NOT DETECTED Final   Enterococcus Faecium NOT DETECTED NOT DETECTED Final   Listeria monocytogenes NOT DETECTED NOT DETECTED Final   Staphylococcus species NOT DETECTED NOT DETECTED Final   Staphylococcus aureus (BCID) NOT DETECTED NOT DETECTED Final   Staphylococcus epidermidis NOT DETECTED NOT DETECTED Final   Staphylococcus lugdunensis NOT DETECTED NOT DETECTED Final   Streptococcus species NOT DETECTED NOT DETECTED Final   Streptococcus agalactiae NOT DETECTED NOT DETECTED Final   Streptococcus pneumoniae NOT DETECTED NOT DETECTED Final   Streptococcus pyogenes NOT DETECTED NOT DETECTED Final   A.calcoaceticus-baumannii NOT DETECTED NOT DETECTED Final   Bacteroides fragilis NOT DETECTED NOT DETECTED Final    Enterobacterales DETECTED (A) NOT DETECTED Final    Comment: Enterobacterales represent a large order of gram negative bacteria, not a single organism. Refer to culture for further identification. CRITICAL RESULT CALLED TO, READ BACK BY  AND VERIFIED WITH: PHARMD STEVEN HURTH ON 10/09/23 @ 1617 BY DRT    Enterobacter cloacae complex NOT DETECTED NOT DETECTED Final   Escherichia coli NOT DETECTED NOT DETECTED Final   Klebsiella aerogenes NOT DETECTED NOT DETECTED Final   Klebsiella oxytoca NOT DETECTED NOT DETECTED Final   Klebsiella pneumoniae NOT DETECTED NOT DETECTED Final   Proteus species NOT DETECTED NOT DETECTED Final   Salmonella species NOT DETECTED NOT DETECTED Final   Serratia marcescens NOT DETECTED NOT DETECTED Final   Haemophilus influenzae NOT DETECTED NOT DETECTED Final   Neisseria meningitidis NOT DETECTED NOT DETECTED Final   Pseudomonas aeruginosa NOT DETECTED NOT DETECTED Final   Stenotrophomonas maltophilia NOT DETECTED NOT DETECTED Final   Candida albicans NOT DETECTED NOT DETECTED Final   Candida auris NOT DETECTED NOT DETECTED Final   Candida glabrata NOT DETECTED NOT DETECTED Final   Candida krusei NOT DETECTED NOT DETECTED Final   Candida parapsilosis NOT DETECTED NOT DETECTED Final   Candida tropicalis NOT DETECTED NOT DETECTED Final   Cryptococcus neoformans/gattii NOT DETECTED NOT DETECTED Final   CTX-M ESBL NOT DETECTED NOT DETECTED Final   Carbapenem resistance IMP NOT DETECTED NOT DETECTED Final   Carbapenem resistance KPC NOT DETECTED NOT DETECTED Final   Carbapenem resistance NDM NOT DETECTED NOT DETECTED Final   Carbapenem resist OXA 48 LIKE NOT DETECTED NOT DETECTED Final   Carbapenem resistance VIM NOT DETECTED NOT DETECTED Final    Comment: Performed at [facility], [address] [place], [place] [address]  Resp panel by RT-PCR (RSV, Flu A&B, Covid) Anterior Nasal Swab     Status: None   Collection Time: 10/08/23  4:19 PM   Specimen:  Anterior Nasal Swab  Result Value Ref Range Status   SARS Coronavirus 2 by RT PCR NEGATIVE NEGATIVE Final    Comment: (NOTE) SARS-CoV-2 target nucleic acids are NOT DETECTED.  The SARS-CoV-2 RNA is generally detectable in upper respiratory specimens during the acute phase of infection. The lowest concentration of SARS-CoV-2 viral copies this assay can detect is 138 copies/mL. A negative result does not preclude SARS-Cov-2 infection and should not be used as the sole basis for treatment or other patient management decisions. A negative result may occur with  improper specimen collection/handling, submission of specimen other than nasopharyngeal swab, presence of viral mutation(s) within the areas targeted by this assay, and inadequate number of viral copies(<138 copies/mL). A negative result must be combined with clinical observations, patient history, and epidemiological information. The expected result is Negative.  Fact Sheet for Patients:  BloggerCourse.com  Fact Sheet for Healthcare Providers:  SeriousBroker.it  This test is no t yet approved or cleared by the [place] FDA and  has been authorized for detection and/or diagnosis of SARS-CoV-2 by FDA under an Emergency Use Authorization (EUA). This EUA will remain  in effect (meaning this test can be used) for the duration of the COVID-19 declaration under Section 564(b)(1) of the Act, 21 U.S.C.section 360bbb-3(b)(1), unless the authorization is terminated  or revoked sooner.       Influenza A by PCR NEGATIVE NEGATIVE Final   Influenza B by PCR NEGATIVE NEGATIVE Final    Comment: (NOTE) The Xpert Xpress SARS-CoV-2/FLU/RSV plus assay is intended as an aid in the diagnosis of influenza from Nasopharyngeal swab specimens and should not be used as a sole basis for treatment. Nasal washings and aspirates are unacceptable for Xpert Xpress SARS-CoV-2/FLU/RSV testing.  Fact  Sheet for Patients: BloggerCourse.com  Fact Sheet  for Healthcare Providers: SeriousBroker.it  This test is not yet approved or cleared by the [place] and has been authorized for detection and/or diagnosis of SARS-CoV-2 by FDA under an Emergency Use Authorization (EUA). This EUA will remain in effect (meaning this test can be used) for the duration of the COVID-19 declaration under Section 564(b)(1) of the Act, 21 U.S.C. section 360bbb-3(b)(1), unless the authorization is terminated or revoked.     Resp Syncytial Virus by PCR NEGATIVE NEGATIVE Final    Comment: (NOTE) Fact Sheet for Patients: BloggerCourse.com  Fact Sheet for Healthcare Providers: SeriousBroker.it  This test is not yet approved or cleared by the [place] FDA and has been authorized for detection and/or diagnosis of SARS-CoV-2 by FDA under an Emergency Use Authorization (EUA). This EUA will remain in effect (meaning this test can be used) for the duration of the COVID-19 declaration under Section 564(b)(1) of the Act, 21 U.S.C. section 360bbb-3(b)(1), unless the authorization is terminated or revoked.  Performed at [facility], [address], [place], [place] [address]   Urine Culture (for pregnant, neutropenic or urologic patients or patients with an indwelling urinary catheter)     Status: None   Collection Time: 10/09/23  5:00 AM   Specimen: In/Out Cath Urine  Result Value Ref Range Status   Specimen Description   Final    IN/OUT CATH URINE Performed at [facility], [address], [place], [place] [address]    Special Requests   Final    NONE Performed at [facility], [address] [place], [place] [address]    Culture   Final    NO GROWTH Performed at [facility], [address] [place], [place] [address]    Report Status 10/10/2023 FINAL  Final    Radiology Studies: No  results found.  Scheduled Meds:  citalopram  20 mg Oral Daily   enoxaparin (LOVENOX) injection  40 mg Subcutaneous Q24H   insulin aspart  0-5 Units Subcutaneous QHS   insulin aspart  0-6 Units Subcutaneous TID WC   pantoprazole  40 mg Oral Daily   Continuous Infusions:  meropenem (MERREM) IV 1 g (10/13/23 0817)    LOS: 5 days   Vassie Loll M.D on 10/13/2023 at 4:49 PM  Go to [email or web address] - for contact info  [facility] - Office  [phone]  If 7PM-7AM, please contact night-coverage [email or web address] 10/13/2023, 4:49 PM

## 2023-10-13 NOTE — TOC Progression Note (Signed)
Transition of Care [facility]) - Progression Note    Patient Details  Name: Dana Ellis MRN: [id number] Date of Birth: 13-Nov-1934  Transition of [facility]) CM/SW Contact  Karn [place], [place] Phone Number: 10/13/2023, 3:16 PM  Clinical Narrative:  [facility] will [facility] pt as long as she does not require IV Merrem. Son and MD updated. CMA will start authorization for [facility] and will update if new SNF is necessary. [facility] unable to [facility] pt over weekend.       Expected Discharge Plan: Skilled Nursing Facility Barriers to Discharge: Continued Medical Work up  Expected Discharge Plan and Services In-house Referral: Clinical Social Work   Post Acute Care Choice: Skilled Nursing Facility Living arrangements for the past 2 months: Single Family Home                                       Social Determinants of Health (SDOH) Interventions SDOH Screenings   Tobacco Use: Low Risk  (10/08/2023)    Readmission Risk Interventions     No data to display

## 2023-10-13 NOTE — Plan of Care (Signed)
  Problem: Education: Goal: Knowledge of General Education information will improve Description Including pain rating scale, medication(s)/side effects and non-pharmacologic comfort measures Outcome: Progressing   Problem: Health Behavior/Discharge Planning: Goal: Ability to manage health-related needs will improve Outcome: Progressing   

## 2023-10-13 NOTE — Care Management Important Message (Signed)
Important Message  Patient Details  Name: Dana Ellis MRN: [id number] Date of Birth: Dec 22, 1933   Important Message Given:  Yes - Medicare IM     Corey Harold 10/13/2023, 2:21 PM

## 2023-10-14 DIAGNOSIS — I10 Essential (primary) hypertension: Secondary | ICD-10-CM | POA: Diagnosis not present

## 2023-10-14 DIAGNOSIS — N390 Urinary tract infection, site not specified: Secondary | ICD-10-CM | POA: Diagnosis not present

## 2023-10-14 DIAGNOSIS — E876 Hypokalemia: Secondary | ICD-10-CM | POA: Diagnosis not present

## 2023-10-14 DIAGNOSIS — I4891 Unspecified atrial fibrillation: Secondary | ICD-10-CM | POA: Diagnosis not present

## 2023-10-14 LAB — GLUCOSE, CAPILLARY
Glucose-Capillary: 110 mg/dL — ABNORMAL HIGH (ref 70–99)
Glucose-Capillary: 150 mg/dL — ABNORMAL HIGH (ref 70–99)
Glucose-Capillary: 171 mg/dL — ABNORMAL HIGH (ref 70–99)
Glucose-Capillary: 185 mg/dL — ABNORMAL HIGH (ref 70–99)

## 2023-10-14 LAB — CBC
HCT: 32.7 % — ABNORMAL LOW (ref 36.0–46.0)
Hemoglobin: 11 g/dL — ABNORMAL LOW (ref 12.0–15.0)
MCH: 31 pg (ref 26.0–34.0)
MCHC: 33.6 g/dL (ref 30.0–36.0)
MCV: 92.1 fL (ref 80.0–100.0)
Platelets: 199 10*3/uL (ref 150–400)
RBC: 3.55 MIL/uL — ABNORMAL LOW (ref 3.87–5.11)
RDW: 15.8 % — ABNORMAL HIGH (ref 11.5–15.5)
WBC: 9.1 10*3/uL (ref 4.0–10.5)
nRBC: 0 % (ref 0.0–0.2)

## 2023-10-14 LAB — BASIC METABOLIC PANEL
Anion gap: 7 (ref 5–15)
BUN: 17 mg/dL (ref 8–23)
CO2: 24 mmol/L (ref 22–32)
Calcium: 8.4 mg/dL — ABNORMAL LOW (ref 8.9–10.3)
Chloride: 105 mmol/L (ref 98–111)
Creatinine, Ser: 0.72 mg/dL (ref 0.44–1.00)
GFR, Estimated: >60 mL/min (ref >60)
Glucose, Bld: 103 mg/dL — ABNORMAL HIGH (ref 70–99)
Potassium: 3.8 mmol/L (ref 3.5–5.1)
Sodium: 136 mmol/L (ref 135–145)

## 2023-10-14 NOTE — Plan of Care (Signed)
  Problem: Activity: Goal: Risk for activity intolerance will decrease Outcome: Progressing   Problem: Nutrition: Goal: Adequate nutrition will be maintained Outcome: Progressing   

## 2023-10-14 NOTE — Progress Notes (Signed)
Progress Note   Patient: Dana Ellis XBJ:[id number] DOB: [birth year] DOA: 10/08/2023     6 DOS: the patient was seen and examined on 10/14/2023   Brief hospital course: Mrs. Padron was admitted to the hospital with the working diagnosis of complicated urinary tract infection.   89 y.o. female with medical history significant for hypertension, diabetes mellitus, depression, and dementia. At home patient was noted to be confused and disorientated. Positive chills and hypoglycemia. On her initial physical examination her temperature was 101.3, RR 16 to 32, blood pressure 117/65, with 02 saturation 98%, lungs with no wheezing or rales, heart with S1 and S2 present and regular, abdomen with no distention and trace lower extremity edema.  Mucous membranes markedly dry, she oriented to person only.   Patient found to have multidrug resistant Klebsiella pneumonia infection.  Patient has been on IV antibiotic therapy, Meropenem, and transition to oral bactrim.   Pending transfer to SNF.      Assessment and Plan: * Complicated UTI (urinary tract infection) Complicated UTI with severe sepsis.  Urine culture positive for Klebsiella  Blood culture positive for enterobacter and citrobacter.   Plan  for antibiotic therapy with meropenem of 5 days IV and then 3 days of bactrim.  Patient very weak and deconditioned, plan for transfer to SNF for recovery.   Essential hypertension Holding blood pressure medications to avoid hypotension.  Patient at home on amlodipine and clonidine.   Atrial fibrillation (HCC) Has bee rate control.  Not on anticoagulation for now.   Hypokalemia Hyponatremia.   Renal function with serum cr at 0,72 with K at 3,8 and serum bicarbonate at 24. Na 136   Hypoglycemia associated with diabetes (HCC) Continue close monitoring of glucose. This am fasting glucose was 103 mg/dl.   Acute metabolic encephalopathy Clinically has resolved.          Subjective:  Patient with no chest pain or dyspnea, no nausea or vomiting,   Physical Exam: Vitals:   10/13/23 1941 10/14/23 0500 10/14/23 1011 10/14/23 1328  BP: (!) 159/82 (!) 163/91 (!) 192/89 (!) 136/95  Pulse: 73 69 84 99  Resp: 20 19 18 19   Temp: 99 F (37.2 C) 98.3 F (36.8 C) 98.4 F (36.9 C) 98.3 F (36.8 C)  TempSrc: Oral Oral Oral Oral  SpO2: 96% 98% 98% 100%  Weight:      Height:       Neurology awake and alert ENT with mild pallor Cardiovascular with S1 and S2 present with no gallops, rubs or murmurs Respiratory with no rales or wheezing Abdomen with no distention  No lower extremity edema  Data Reviewed:    Family Communication: no family at the bedside   Disposition: Status is: Inpatient Remains inpatient appropriate because: pending transfer to SNF   Planned Discharge Destination: Skilled nursing facility      Author: Coralie Keens, MD 10/14/2023 2:16 PM  For on call review [email or web address].

## 2023-10-14 NOTE — Assessment & Plan Note (Signed)
Hyponatremia.   Renal function with serum cr at 0,72 with K at 3,8 and serum bicarbonate at 24. Na 136

## 2023-10-14 NOTE — Hospital Course (Addendum)
Mrs. Fracassi was admitted to the hospital with the working diagnosis of complicated urinary tract infection.   89 y.o. female with medical history significant for hypertension, diabetes mellitus, depression, and dementia. At home patient was noted to be confused and disorientated. Positive chills and hypoglycemia. On her initial physical examination her temperature was 101.3, RR 16 to 32, blood pressure 117/65, with 02 saturation 98%, lungs with no wheezing or rales, heart with S1 and S2 present and regular, abdomen with no distention and trace lower extremity edema.  Mucous membranes markedly dry, she oriented to person only.   Patient found to have multidrug resistant Klebsiella pneumonia infection.  Patient has been on IV antibiotic therapy, Meropenem, and transition to oral bactrim.   Pending transfer to SNF.

## 2023-10-15 DIAGNOSIS — I10 Essential (primary) hypertension: Secondary | ICD-10-CM | POA: Diagnosis not present

## 2023-10-15 DIAGNOSIS — I4891 Unspecified atrial fibrillation: Secondary | ICD-10-CM | POA: Diagnosis not present

## 2023-10-15 DIAGNOSIS — E876 Hypokalemia: Secondary | ICD-10-CM | POA: Diagnosis not present

## 2023-10-15 DIAGNOSIS — N390 Urinary tract infection, site not specified: Secondary | ICD-10-CM | POA: Diagnosis not present

## 2023-10-15 LAB — GLUCOSE, CAPILLARY
Glucose-Capillary: 132 mg/dL — ABNORMAL HIGH (ref 70–99)
Glucose-Capillary: 182 mg/dL — ABNORMAL HIGH (ref 70–99)
Glucose-Capillary: 232 mg/dL — ABNORMAL HIGH (ref 70–99)

## 2023-10-15 MED ORDER — AMLODIPINE BESYLATE 5 MG PO TABS
5.0000 mg | ORAL_TABLET | Freq: Every day | ORAL | Status: DC
  Administered 2023-10-15 – 2023-10-16 (×2): 5 mg via ORAL
  Filled 2023-10-15 (×2): qty 1

## 2023-10-15 NOTE — Progress Notes (Signed)
Progress Note   Patient: Dana Ellis ZYS:[id number] DOB: 08-14-1934 DOA: 10/08/2023     7 DOS: the patient was seen and examined on 10/15/2023   Brief hospital course: Mrs. Scaff was admitted to the hospital with the working diagnosis of complicated urinary tract infection.   89 y.o. female with medical history significant for hypertension, diabetes mellitus, depression, and dementia. At home patient was noted to be confused and disorientated. Positive chills and hypoglycemia. On her initial physical examination her temperature was 101.3, RR 16 to 32, blood pressure 117/65, with 02 saturation 98%, lungs with no wheezing or rales, heart with S1 and S2 present and regular, abdomen with no distention and trace lower extremity edema.  Mucous membranes markedly dry, she oriented to person only.   Patient found to have multidrug resistant Klebsiella pneumonia infection.  Patient has been on IV antibiotic therapy, Meropenem, and transition to oral bactrim.   Pending transfer to SNF.      Assessment and Plan: * Complicated UTI (urinary tract infection) Complicated UTI with severe sepsis.  Urine culture positive for Klebsiella  Blood culture positive for enterobacter and citrobacter.   Plan  for antibiotic therapy with meropenem of 5 days IV and then 3 days of bactrim.  Patient very weak and deconditioned, plan for transfer to SNF for recovery.   Essential hypertension Holding blood pressure medications to avoid hypotension.  Patient at home on amlodipine and clonidine.   Atrial fibrillation (HCC) Has bee rate control.  Not on anticoagulation for now.   Hypokalemia Hyponatremia.   Renal function with serum cr at 0,72 with K at 3,8 and serum bicarbonate at 24. Na 136   Hypoglycemia associated with diabetes (HCC) Continue close monitoring of glucose. This am fasting glucose was 103 mg/dl.   Acute metabolic encephalopathy Clinically has resolved.         Subjective:  Patient has been feeling well, today she is out of bed in the chair, no chest pain or dyspnea, continue deconditioned and not back to baseline   Physical Exam: Vitals:   10/14/23 1328 10/14/23 2047 10/15/23 0405 10/15/23 1333  BP: (!) 136/95 (!) 155/95 (!) 172/95 (!) 116/92  Pulse: 99 87 69 85  Resp: 19 18 14 18   Temp: 98.3 F (36.8 C) 98.6 F (37 C) 98.3 F (36.8 C) 98.6 F (37 C)  TempSrc: Oral Oral Oral Oral  SpO2: 100% 98% 98% 100%  Weight:      Height:       Neurology awake and alert ENT with mild pallor Cardiovascular with S1 and S2 present and regular with no gallops Respiratory with no rales or wheezing, no rhonchi Abdomen with no distention  No lower extremity edema  Data Reviewed:    Family Communication: no family at the bedside   Disposition: Status is: Inpatient Remains inpatient appropriate because: pending transfer to SNF  Planned Discharge Destination: Skilled nursing facility      Author: Coralie Keens, MD 10/15/2023 3:01 PM  For on call review [email or web address].

## 2023-10-16 DIAGNOSIS — E1169 Type 2 diabetes mellitus with other specified complication: Secondary | ICD-10-CM | POA: Diagnosis not present

## 2023-10-16 DIAGNOSIS — F0390 Unspecified dementia without behavioral disturbance: Secondary | ICD-10-CM | POA: Diagnosis not present

## 2023-10-16 DIAGNOSIS — G8929 Other chronic pain: Secondary | ICD-10-CM | POA: Diagnosis not present

## 2023-10-16 DIAGNOSIS — J3089 Other allergic rhinitis: Secondary | ICD-10-CM | POA: Diagnosis not present

## 2023-10-16 DIAGNOSIS — E11649 Type 2 diabetes mellitus with hypoglycemia without coma: Secondary | ICD-10-CM | POA: Diagnosis not present

## 2023-10-16 DIAGNOSIS — A419 Sepsis, unspecified organism: Secondary | ICD-10-CM | POA: Diagnosis not present

## 2023-10-16 DIAGNOSIS — R262 Difficulty in walking, not elsewhere classified: Secondary | ICD-10-CM | POA: Diagnosis not present

## 2023-10-16 DIAGNOSIS — F339 Major depressive disorder, recurrent, unspecified: Secondary | ICD-10-CM | POA: Diagnosis not present

## 2023-10-16 DIAGNOSIS — E785 Hyperlipidemia, unspecified: Secondary | ICD-10-CM | POA: Diagnosis not present

## 2023-10-16 DIAGNOSIS — R488 Other symbolic dysfunctions: Secondary | ICD-10-CM | POA: Diagnosis not present

## 2023-10-16 DIAGNOSIS — M6281 Muscle weakness (generalized): Secondary | ICD-10-CM | POA: Diagnosis not present

## 2023-10-16 DIAGNOSIS — B9620 Unspecified Escherichia coli [E. coli] as the cause of diseases classified elsewhere: Secondary | ICD-10-CM | POA: Diagnosis not present

## 2023-10-16 DIAGNOSIS — M1990 Unspecified osteoarthritis, unspecified site: Secondary | ICD-10-CM | POA: Diagnosis not present

## 2023-10-16 DIAGNOSIS — E1351 Other specified diabetes mellitus with diabetic peripheral angiopathy without gangrene: Secondary | ICD-10-CM | POA: Diagnosis not present

## 2023-10-16 DIAGNOSIS — Z1624 Resistance to multiple antibiotics: Secondary | ICD-10-CM | POA: Diagnosis not present

## 2023-10-16 DIAGNOSIS — E876 Hypokalemia: Secondary | ICD-10-CM | POA: Diagnosis not present

## 2023-10-16 DIAGNOSIS — E1165 Type 2 diabetes mellitus with hyperglycemia: Secondary | ICD-10-CM | POA: Diagnosis not present

## 2023-10-16 DIAGNOSIS — B9689 Other specified bacterial agents as the cause of diseases classified elsewhere: Secondary | ICD-10-CM | POA: Diagnosis not present

## 2023-10-16 DIAGNOSIS — N390 Urinary tract infection, site not specified: Secondary | ICD-10-CM | POA: Diagnosis not present

## 2023-10-16 DIAGNOSIS — G40909 Epilepsy, unspecified, not intractable, without status epilepticus: Secondary | ICD-10-CM | POA: Diagnosis not present

## 2023-10-16 DIAGNOSIS — N3946 Mixed incontinence: Secondary | ICD-10-CM | POA: Diagnosis not present

## 2023-10-16 DIAGNOSIS — I700 Atherosclerosis of aorta: Secondary | ICD-10-CM | POA: Diagnosis not present

## 2023-10-16 DIAGNOSIS — G9341 Metabolic encephalopathy: Secondary | ICD-10-CM | POA: Diagnosis not present

## 2023-10-16 DIAGNOSIS — E119 Type 2 diabetes mellitus without complications: Secondary | ICD-10-CM | POA: Diagnosis not present

## 2023-10-16 DIAGNOSIS — I10 Essential (primary) hypertension: Secondary | ICD-10-CM | POA: Diagnosis not present

## 2023-10-16 DIAGNOSIS — E042 Nontoxic multinodular goiter: Secondary | ICD-10-CM | POA: Diagnosis not present

## 2023-10-16 DIAGNOSIS — B961 Klebsiella pneumoniae [K. pneumoniae] as the cause of diseases classified elsewhere: Secondary | ICD-10-CM | POA: Diagnosis not present

## 2023-10-16 DIAGNOSIS — E782 Mixed hyperlipidemia: Secondary | ICD-10-CM | POA: Diagnosis not present

## 2023-10-16 DIAGNOSIS — E1122 Type 2 diabetes mellitus with diabetic chronic kidney disease: Secondary | ICD-10-CM | POA: Diagnosis not present

## 2023-10-16 DIAGNOSIS — I480 Paroxysmal atrial fibrillation: Secondary | ICD-10-CM | POA: Diagnosis not present

## 2023-10-16 DIAGNOSIS — N184 Chronic kidney disease, stage 4 (severe): Secondary | ICD-10-CM | POA: Diagnosis not present

## 2023-10-16 DIAGNOSIS — M25511 Pain in right shoulder: Secondary | ICD-10-CM | POA: Diagnosis not present

## 2023-10-16 DIAGNOSIS — E559 Vitamin D deficiency, unspecified: Secondary | ICD-10-CM | POA: Diagnosis not present

## 2023-10-16 DIAGNOSIS — I4821 Permanent atrial fibrillation: Secondary | ICD-10-CM | POA: Diagnosis not present

## 2023-10-16 DIAGNOSIS — N952 Postmenopausal atrophic vaginitis: Secondary | ICD-10-CM | POA: Diagnosis not present

## 2023-10-16 DIAGNOSIS — K219 Gastro-esophageal reflux disease without esophagitis: Secondary | ICD-10-CM | POA: Diagnosis not present

## 2023-10-16 DIAGNOSIS — I129 Hypertensive chronic kidney disease with stage 1 through stage 4 chronic kidney disease, or unspecified chronic kidney disease: Secondary | ICD-10-CM | POA: Diagnosis not present

## 2023-10-16 DIAGNOSIS — I4891 Unspecified atrial fibrillation: Secondary | ICD-10-CM | POA: Diagnosis not present

## 2023-10-16 DIAGNOSIS — R600 Localized edema: Secondary | ICD-10-CM | POA: Diagnosis not present

## 2023-10-16 DIAGNOSIS — N1831 Chronic kidney disease, stage 3a: Secondary | ICD-10-CM | POA: Diagnosis not present

## 2023-10-16 DIAGNOSIS — R6520 Severe sepsis without septic shock: Secondary | ICD-10-CM | POA: Diagnosis not present

## 2023-10-16 LAB — BASIC METABOLIC PANEL
Anion gap: 7 (ref 5–15)
BUN: 13 mg/dL (ref 8–23)
CO2: 25 mmol/L (ref 22–32)
Calcium: 8.5 mg/dL — ABNORMAL LOW (ref 8.9–10.3)
Chloride: 103 mmol/L (ref 98–111)
Creatinine, Ser: 0.81 mg/dL (ref 0.44–1.00)
GFR, Estimated: >60 mL/min (ref >60)
Glucose, Bld: 135 mg/dL — ABNORMAL HIGH (ref 70–99)
Potassium: 4 mmol/L (ref 3.5–5.1)
Sodium: 135 mmol/L (ref 135–145)

## 2023-10-16 LAB — GLUCOSE, CAPILLARY
Glucose-Capillary: 237 mg/dL — ABNORMAL HIGH (ref 70–99)
Glucose-Capillary: 139 mg/dL — ABNORMAL HIGH (ref 70–99)
Glucose-Capillary: 224 mg/dL — ABNORMAL HIGH (ref 70–99)

## 2023-10-16 MED ORDER — MELOXICAM 7.5 MG PO TABS: 7.5000 mg | ORAL_TABLET | Freq: Every day | ORAL | Status: DC | PRN

## 2023-10-16 MED ORDER — POTASSIUM CHLORIDE CRYS ER 20 MEQ PO TBCR: 20.0000 meq | EXTENDED_RELEASE_TABLET | Freq: Every day | ORAL | Status: DC

## 2023-10-16 MED ORDER — POLYETHYLENE GLYCOL 3350 17 G PO PACK: 17.0000 g | PACK | Freq: Every day | ORAL | Status: AC | PRN

## 2023-10-16 NOTE — Progress Notes (Signed)
Ng Discharge Note  Admit Date:  10/08/2023 Discharge date: 10/16/2023   Dana Ellis to be D/C'd Skilled nursing facility per MD order.  AVS completed. Patient/caregiver able to verbalize understanding.  Discharge Medication: Allergies as of 10/16/2023       Reactions   Ace Inhibitors Cough   Pork-derived Products    Family reports patient does not eat pork         Medication List     TAKE these medications    amLODipine 5 MG tablet Commonly known as: NORVASC Take 5 mg by mouth daily.   calcium-vitamin D 500-200 MG-UNIT tablet Commonly known as: OSCAL WITH D Take 1 tablet by mouth 2 (two) times daily.   citalopram 20 MG tablet Commonly known as: CELEXA Take 20 mg by mouth daily.   cloNIDine 0.1 MG tablet Commonly known as: CATAPRES Take 0.1 mg by mouth 2 (two) times daily.   conjugated estrogens vaginal cream Commonly known as: Engineer, site. Insert a blueberry size amount (approximately 1 gram) of cream on fingertip inside vagina at bedtime every night.   fluticasone 50 MCG/ACT nasal spray Commonly known as: FLONASE Place 2 sprays into both nostrils daily as needed for rhinitis or allergies.   meloxicam 7.5 MG tablet Commonly known as: MOBIC Take 1 tablet (7.5 mg total) by mouth daily as needed for pain. What changed:  when to take this reasons to take this   omeprazole 20 MG capsule Commonly known as: PRILOSEC Take 20 mg by mouth daily.   polyethylene glycol 17 g packet Commonly known as: MIRALAX / GLYCOLAX Take 17 g by mouth daily as needed for mild constipation.   potassium chloride SA 20 MEQ tablet Commonly known as: KLOR-CON M Take 1 tablet (20 mEq total) by mouth daily. *Alternate taking one tablet daily and taking two tablets daily* What changed:  how much to take when to take this   pravastatin 40 MG tablet Commonly known as: PRAVACHOL Take 40 mg by mouth daily.   [name] FlexTouch 100 UNIT/ML FlexTouch  Pen Generic drug: insulin degludec Inject 10-14 Units into the skin daily. Under 150, pt is administered 10 units. If levels are over 150, pt is administered 14 units.   Vitamin D (Ergocalciferol) 1.25 MG (50000 UNIT) Caps capsule Commonly known as: DRISDOL Take 50,000 Units by mouth once a week.        Discharge Assessment: Vitals:   10/15/23 2100 10/16/23 0404  BP: (!) 164/88 (!) 165/93  Pulse: 73 75  Resp: 18 19  Temp: 98.4 F (36.9 C) 98.2 F (36.8 C)  SpO2: 100% 100%   Skin clean, dry and intact without evidence of skin break down, no evidence of skin tears noted. IV catheter discontinued intact. Site without signs and symptoms of complications - no redness or edema noted at insertion site, patient denies c/o pain - only slight tenderness at site.  Dressing with slight pressure applied.  D/c Instructions-Education: Discharge instructions given to patient/family with verbalized understanding. D/c education completed with patient/family including follow up instructions, medication list, d/c activities limitations if indicated, with other d/c instructions as indicated by MD - patient able to verbalize understanding, all questions fully answered. Patient instructed to return to ED, call 911, or call MD for any changes in condition.  Patient escorted by staff via WC, and D/C to [facility].  Cristal Ford, LPN 54/08/8118 1:47 PM

## 2023-10-16 NOTE — Progress Notes (Signed)
Physical Therapy Treatment Patient Details Name: Dana Ellis MRN: [id number] DOB: 1934-11-30 Today's Date: 10/16/2023   History of Present Illness Per [id number] Dana Ellis is a 89 y.o. female with medical history significant for hypertension, diabetes mellitus, depression, dementia.  Patient was brought to the ED from home with reports of altered mental status and low blood glucose, also reported new diagnosis of early dementia.  At the time of my evaluation patient is awake and alert, oriented to person only, able to answer simple questions.  Patient's son who lives close to patient, but the patient has been confused, talking " nonsense, and weak.  She has a history of recurrent urinary tract infections.  This morning patient's son reported that patient usually gets up at 9:30 in the morning, but today she was in bed with rigors and she was confused and too weak to get up.    PT Comments  Pt sitting up in bed, willing to work with therapy.  Pt able to complete bed mobilty with mod independence, increased time and some difficulty scooting to EOB due to weakness.  Attempted standing 3X before successfully standing in walker.  PT is extremely forward bent in standing with posterior weight distribution.  Mod assist from therapist to keep weight forward and also to discourage reaching outside walker BOS for items.  Pt very fearful and with some confusion on where to keep UE's on walker.  PT only able to side step over to chair with visible weakness in LE's.  Completed 2 sets of 20 reps of exercises once up into chair.      If plan is discharge home, recommend the following: A lot of help with walking and/or transfers;A lot of help with bathing/dressing/bathroom;Assistance with cooking/housework;Help with stairs or ramp for entrance   Can travel by private vehicle     Yes  Equipment Recommendations  None recommended by PT           Mobility  Bed Mobility Overal bed mobility: Modified  Independent Bed Mobility: Supine to Sit                Transfers Overall transfer level: Needs assistance Equipment used: Rolling walker (2 wheels) Transfers: Sit to/from Stand, Bed to chair/wheelchair/BSC Sit to Stand: Max assist, Mod assist   Step pivot transfers: Mod assist       General transfer comment: pt tends to pull on walker, difficulty with UE placement, establishing standing balance, reaching outside walker BOS, fear with mobility, forward bent with tendency to lean backward    Ambulation/Gait Ambulation/Gait assistance: Mod assist Gait Distance (Feet): 5 Feet Assistive device: Rolling walker (2 wheels) Gait Pattern/deviations: Decreased step length - right, Decreased step length - left, Trunk flexed, Leaning posteriorly, Narrow base of support Gait velocity: slow and fearful              Cognition Arousal: Alert Behavior During Therapy: WFL for tasks assessed/performed Overall Cognitive Status: Within Functional Limits for tasks assessed                                          Exercises General Exercises - Lower Extremity Ankle Circles/Pumps: Seated, 20 reps, AROM, Both Long Arc Quad: AROM, Both, 20 reps, Seated Hip Flexion/Marching: AROM, Both, 20 reps, Seated    General Comments        Pertinent Vitals/Pain Pain Assessment Pain Assessment: No/denies pain  PT Goals (current goals can now be found in the care plan section) Acute Rehab PT Goals Patient Stated Goal: To go home PT Goal Formulation: With patient Time For Goal Achievement: 10/17/23 Potential to Achieve Goals: Fair Progress towards PT goals: Progressing toward goals    Frequency    Min 3X/week       AM-PAC PT "6 Clicks" Mobility   Outcome Measure  Help needed turning from your back to your side while in a flat bed without using bedrails?: None Help needed moving from lying on your back to sitting on the side of a flat bed without using  bedrails?: None Help needed moving to and from a bed to a chair (including a wheelchair)?: A Lot Help needed standing up from a chair using your arms (e.g., wheelchair or bedside chair)?: A Lot Help needed to walk in hospital room?: A Lot Help needed climbing 3-5 steps with a railing? : Total 6 Click Score: 15    End of Session Equipment Utilized During Treatment: Gait belt Activity Tolerance: Other (comment) (limited by fear) Patient left: in chair;with call bell/phone within reach Nurse Communication: Mobility status PT Visit Diagnosis: Unsteadiness on feet (R26.81);Muscle weakness (generalized) (M62.81);Difficulty in walking, not elsewhere classified (R26.2)     Time: 1040-1104 PT Time Calculation (min) (ACUTE ONLY): 24 min  Charges:    $Gait Training: 8-22 mins $Therapeutic Exercise: 8-22 mins PT General Charges $$ ACUTE PT VISIT: 1 Visit                     Lurena Nida, PTA/CLT [facility] Ph: [phone]    Lurena Nida 10/16/2023, 11:05 AM

## 2023-10-16 NOTE — Progress Notes (Signed)
Report given to Idell Pickles, LPN at [facility].

## 2023-10-16 NOTE — Discharge Summary (Signed)
Physician Discharge Summary   Patient: Dana Ellis MRN: [id number] DOB: [birth year]  Admit date:     10/08/2023  Discharge date: 10/16/23  Discharge Physician: Vassie Loll   PCP: Benita Stabile, MD   Recommendations at discharge:  Repeat basic metabolic panel in 5 days to follow renal function and electrolytes stability Reassess blood pressure and adjust antihypertensive treatment as needed Repeat CBC to follow hemoglobin trend/stability. Continue to follow CBG/A1C  fluctuation with further adjustment to hypoglycemia regimen as required.  Discharge Diagnoses: Principal Problem:   Complicated UTI (urinary tract infection) Active Problems:   Essential hypertension   Atrial fibrillation (HCC)   Hypokalemia   Hypoglycemia associated with diabetes (HCC)   Acute metabolic encephalopathy  Brief Hospital admission course: As per H&P written by Dr. Mariea Clonts on 10/08/2023 Dana Ellis is a 89 y.o. female with medical history significant for hypertension, diabetes mellitus, depression, dementia. Patient was brought to the ED from home with reports of altered mental status and low blood glucose, also reported new diagnosis of early dementia. At the time of my evaluation patient is awake and alert, oriented to person only, able to answer simple questions.  Patient's son who lives close to patient, but the patient has been confused, talking " nonsense, and weak.  She has a history of recurrent urinary tract infections.  This morning patient's son reported that patient usually gets up at 9:30 in the morning, but today she was in bed with rigors and she was confused and too weak to get up.   Patient denies cough, difficulty breathing, no abdominal pain, no vomiting or diarrhea.  She denies falls. Reports blood sugar- 47.    ED Course: Febrile to 101.3, heart rate 89, respirate rate 16- 32, blood pressure 117-123 systolic.  O2 sats 98% on room air.  Hypokalemia potassium 2.5. Leukocytosis of  13.8. EKG shows atrial fibrillation-rate 87. CBG 59. X-ray without active disease.  Assessment and Plan: 1)History of Recurrent MDR UTI Complicated UTI with severe sepsis.   -Afebrile. --  Urine cultures 9/15 grew multidrug-resistant Klebsiella. -Blood cultures from 10/08/2023 with Enterobacterales group-demonstrating Citrobacter frundei --Awaiting blood culture result from 10/08/2023 to decide on discharge antibiotic choice -ID service has been consulted; after 5 days of meropenem, and following cultures results/sensitivity antibiotics were transitioned to Bactrim orally 1 double strength tablet twice a day for 3 more days to complete antibiotic therapy.  Patient successfully completed treatment while inpatient. -No complaints of dysuria or having any signs of active infection at discharge.   2)DM2-hyperglycemia -Currently 6.4 reflecting excellent diabetic control PTA -Patient had hypoglycemia due to poor oral intake -Continue to follow CBGs and further adjust hypoglycemic regimen as needed. -No further episode of hypoglycemia appreciated throughout hospitalization.   3)Acute metabolic encephalopathy superimposed on underlying dementia -Suspect cognitive and mentation issues worsened due to dehydration, hypoglycemia and presumed UTI -Continue constant reorientation and continue to maintain adequate hydration. -Patient advised to maintain adequate oral intake.   4)HypoNatremia/Hypokalemia----in the setting of dehydration -Continue to maintain adequate hydration. -continue potassium supplementation.   5)PAFib--not on rate controlling agents chronically -Not on anticoagulation PTA -Rate stable and controlled.   6)HTN--- -stable -Resume home antihypertensive agents and follow vital signs fluctuation.   7)GERD---  -continue Protonix -Patient encouraged to request as needed antiemetics.   8) physical deconditioning -Continue supportive care -PT recommending SNF -Appreciate  assistance TOC with placement to the [facility] center for further rehabilitation and conditioning.  Consultants: Infectious disease Procedures performed: See below for x-ray  reports. Disposition: Skilled nursing facility Diet recommendation: Heart healthy and modified carbohydrate diet.  DISCHARGE MEDICATION: Allergies as of 10/16/2023       Reactions   Ace Inhibitors Cough   Pork-derived Products    Family reports patient does not eat pork         Medication List     TAKE these medications    amLODipine 5 MG tablet Commonly known as: NORVASC Take 5 mg by mouth daily.   calcium-vitamin D 500-200 MG-UNIT tablet Commonly known as: OSCAL WITH D Take 1 tablet by mouth 2 (two) times daily.   citalopram 20 MG tablet Commonly known as: CELEXA Take 20 mg by mouth daily.   cloNIDine 0.1 MG tablet Commonly known as: CATAPRES Take 0.1 mg by mouth 2 (two) times daily.   conjugated estrogens vaginal cream Commonly known as: Engineer, site. Insert a blueberry size amount (approximately 1 gram) of cream on fingertip inside vagina at bedtime every night.   fluticasone 50 MCG/ACT nasal spray Commonly known as: FLONASE Place 2 sprays into both nostrils daily as needed for rhinitis or allergies.   meloxicam 7.5 MG tablet Commonly known as: MOBIC Take 1 tablet (7.5 mg total) by mouth daily as needed for pain. What changed:  when to take this reasons to take this   omeprazole 20 MG capsule Commonly known as: PRILOSEC Take 20 mg by mouth daily.   polyethylene glycol 17 g packet Commonly known as: MIRALAX / GLYCOLAX Take 17 g by mouth daily as needed for mild constipation.   potassium chloride SA 20 MEQ tablet Commonly known as: KLOR-CON M Take 1 tablet (20 mEq total) by mouth daily. *Alternate taking one tablet daily and taking two tablets daily* What changed:  how much to take when to take this   pravastatin 40 MG tablet Commonly known as:  PRAVACHOL Take 40 mg by mouth daily.   [name] FlexTouch 100 UNIT/ML FlexTouch Pen Generic drug: insulin degludec Inject 10-14 Units into the skin daily. Under 150, pt is administered 10 units. If levels are over 150, pt is administered 14 units.   Vitamin D (Ergocalciferol) 1.25 MG (50000 UNIT) Caps capsule Commonly known as: DRISDOL Take 50,000 Units by mouth once a week.        Follow-up Information     Benita Stabile, MD. Schedule an appointment as soon as possible for a visit in 10 day(s).   Specialty: Internal Medicine Why: After discharge from the skilled nursing facility. Contact information: [address] [place] [facility] [address] [phone]                Discharge Exam: Filed Weights   10/08/23 1541  Weight: 73.4 kg   General exam: Alert, awake, oriented x 2, following commands appropriately and in no acute distress.  Chronically ill in appearance and deconditioned. Respiratory system: Clear to auscultation. Respiratory effort normal.  Good saturation on room air. Cardiovascular system: Rate controlled, no rubs, no gallops, no JVD. Gastrointestinal system: Abdomen is nondistended, soft and nontender. No organomegaly or masses felt. Normal bowel sounds heard. Central nervous system: No focal neurological deficits. Extremities: No C/C/E, +pedal pulses Skin: No petechiae. Psychiatry: Mood & affect appropriate.    Condition at discharge: Stable and improved.  The results of significant diagnostics from this hospitalization (including imaging, microbiology, ancillary and laboratory) are listed below for reference.   Imaging Studies: CT HEAD WO CONTRAST ( )  Result Date: 10/09/2023 CLINICAL DATA:  Altered mental status, sepsis,  UTI EXAM: CT HEAD WITHOUT CONTRAST TECHNIQUE: Contiguous axial images were obtained from the base of the skull through the vertex without intravenous contrast. RADIATION DOSE REDUCTION: This exam was performed according to the  departmental dose-optimization program which includes automated exposure control, adjustment of the mA and/or kV according to patient size and/or use of iterative reconstruction technique. COMPARISON:  09/14/2012 FINDINGS: Brain: No evidence of acute infarction, hemorrhage, mass, mass effect, or midline shift. No hydrocephalus or extra-axial fluid collection. Age related cerebral atrophy, which is within normal limits. Periventricular white matter changes, likely the sequela of chronic small vessel ischemic disease. Vascular: No hyperdense vessel. Atherosclerotic calcifications in the intracranial carotid and vertebral arteries. Skull: Negative for fracture or focal lesion. Sinuses/Orbits: No acute finding. Other: The mastoid air cells are well aerated. IMPRESSION: No acute intracranial process. Electronically Signed   By: Wiliam Ke M.D.   On: 10/09/2023 00:11   DG Chest Port 1 View  Result Date: 10/08/2023 CLINICAL DATA:  Sepsis, fever EXAM: PORTABLE CHEST 1 VIEW COMPARISON:  01/27/2021 FINDINGS: Lungs are clear. No pneumothorax or pleural effusion. Large hiatal hernia again noted, better visualized on CT examination of 06/25/2019. Stable mild cardiomegaly. No acute bone abnormality. IMPRESSION: 1. No active disease. 2. Large hiatal hernia. 3. Stable cardiomegaly Electronically Signed   By: Helyn Numbers M.D.   On: 10/08/2023 17:46    Microbiology: Results for orders placed or performed during the hospital encounter of 10/08/23  Blood Culture (routine x 2)     Status: Abnormal   Collection Time: 10/08/23  4:14 PM   Specimen: BLOOD RIGHT HAND  Result Value Ref Range Status   Specimen Description   Final    BLOOD RIGHT HAND BOTTLES DRAWN AEROBIC AND ANAEROBIC Performed at [facility], [address], [place], [place] [address]    Special Requests   Final    Blood Culture adequate volume Performed at [facility], [address], [place], [place] [address]    Culture  Setup Time   Final     GRAM NEGATIVE RODS IN BOTH AEROBIC AND ANAEROBIC BOTTLES Gram Stain Report Called to,Read Back By and Verified With: R TEJEDA AT 0838 ON [id number] BY S DALTON CRITICAL RESULT CALLED TO, READ BACK BY AND VERIFIED WITH: PHARMD STEVEN HURTH ON 10/09/23 @ 1617 BY DRT Performed at [facility], [address], [place], [place] [address]    Culture CITROBACTER FREUNDII (A)  Final   Report Status 10/11/2023 FINAL  Final   Organism ID, Bacteria CITROBACTER FREUNDII  Final      Susceptibility   Citrobacter freundii - MIC*    CEFEPIME <=0.12 SENSITIVE Sensitive     CEFTAZIDIME <=1 SENSITIVE Sensitive     CEFTRIAXONE <=0.25 SENSITIVE Sensitive     CIPROFLOXACIN <=0.25 SENSITIVE Sensitive     GENTAMICIN <=1 SENSITIVE Sensitive     IMIPENEM <=0.25 SENSITIVE Sensitive     TRIMETH/SULFA <=20 SENSITIVE Sensitive     PIP/TAZO <=4 SENSITIVE Sensitive ug/mL    * CITROBACTER FREUNDII  Blood Culture (routine x 2)     Status: Abnormal   Collection Time: 10/08/23  4:14 PM   Specimen: BLOOD RIGHT ARM  Result Value Ref Range Status   Specimen Description   Final    BLOOD RIGHT ARM BOTTLES DRAWN AEROBIC AND ANAEROBIC Performed at [facility], [address] [place], [place] [address]    Special Requests   Final    Blood Culture adequate volume Performed at [facility], [address] [place], [place] [address]    Culture  Setup Time   Final    GRAM NEGATIVE RODS IN BOTH AEROBIC AND ANAEROBIC BOTTLES Gram Stain Report Called to,Read Back By and Verified With: R TEJEDA AT 0838 ON [id number] BY S DALTON CRITICAL RESULT CALLED TO, READ BACK BY AND VERIFIED WITH: PHARMD STEVEN HURTH ON 10/09/23 @ 1617 BY DRT    Culture (A)  Final    CITROBACTER FREUNDII SUSCEPTIBILITIES PERFORMED ON PREVIOUS CULTURE WITHIN THE LAST 5 DAYS. Performed at [facility], [address] [place], [place] [address]    Report Status 10/11/2023 FINAL  Final  Blood Culture ID Panel (Reflexed)     Status: Abnormal    Collection Time: 10/08/23  4:14 PM  Result Value Ref Range Status   Enterococcus faecalis NOT DETECTED NOT DETECTED Final   Enterococcus Faecium NOT DETECTED NOT DETECTED Final   Listeria monocytogenes NOT DETECTED NOT DETECTED Final   Staphylococcus species NOT DETECTED NOT DETECTED Final   Staphylococcus aureus (BCID) NOT DETECTED NOT DETECTED Final   Staphylococcus epidermidis NOT DETECTED NOT DETECTED Final   Staphylococcus lugdunensis NOT DETECTED NOT DETECTED Final   Streptococcus species NOT DETECTED NOT DETECTED Final   Streptococcus agalactiae NOT DETECTED NOT DETECTED Final   Streptococcus pneumoniae NOT DETECTED NOT DETECTED Final   Streptococcus pyogenes NOT DETECTED NOT DETECTED Final   A.calcoaceticus-baumannii NOT DETECTED NOT DETECTED Final   Bacteroides fragilis NOT DETECTED NOT DETECTED Final   Enterobacterales DETECTED (A) NOT DETECTED Final    Comment: Enterobacterales represent a large order of gram negative bacteria, not a single organism. Refer to culture for further identification. CRITICAL RESULT CALLED TO, READ BACK BY AND VERIFIED WITH: PHARMD STEVEN HURTH ON 10/09/23 @ 1617 BY DRT    Enterobacter cloacae complex NOT DETECTED NOT DETECTED Final   Escherichia coli NOT DETECTED NOT DETECTED Final   Klebsiella aerogenes NOT DETECTED NOT DETECTED Final   Klebsiella oxytoca NOT DETECTED NOT DETECTED Final   Klebsiella pneumoniae NOT DETECTED NOT DETECTED Final   Proteus species NOT DETECTED NOT DETECTED Final   Salmonella species NOT DETECTED NOT DETECTED Final   Serratia marcescens NOT DETECTED NOT DETECTED Final   Haemophilus influenzae NOT DETECTED NOT DETECTED Final   Neisseria meningitidis NOT DETECTED NOT DETECTED Final   Pseudomonas aeruginosa NOT DETECTED NOT DETECTED Final   Stenotrophomonas maltophilia NOT DETECTED NOT DETECTED Final   Candida albicans NOT DETECTED NOT DETECTED Final   Candida auris NOT DETECTED NOT DETECTED Final   Candida glabrata  NOT DETECTED NOT DETECTED Final   Candida krusei NOT DETECTED NOT DETECTED Final   Candida parapsilosis NOT DETECTED NOT DETECTED Final   Candida tropicalis NOT DETECTED NOT DETECTED Final   Cryptococcus neoformans/gattii NOT DETECTED NOT DETECTED Final   CTX-M ESBL NOT DETECTED NOT DETECTED Final   Carbapenem resistance IMP NOT DETECTED NOT DETECTED Final   Carbapenem resistance KPC NOT DETECTED NOT DETECTED Final   Carbapenem resistance NDM NOT DETECTED NOT DETECTED Final   Carbapenem resist OXA 48 LIKE NOT DETECTED NOT DETECTED Final   Carbapenem resistance VIM NOT DETECTED NOT DETECTED Final    Comment: Performed at [facility], [address] [place], [place] [address]  Resp panel by RT-PCR (RSV, Flu A&B, Covid) Anterior Nasal Swab     Status: None   Collection Time: 10/08/23  4:19 PM   Specimen: Anterior Nasal Swab  Result Value Ref Range Status   SARS Coronavirus 2 by RT  PCR NEGATIVE NEGATIVE Final    Comment: (NOTE) SARS-CoV-2 target nucleic acids are NOT DETECTED.  The SARS-CoV-2 RNA is generally detectable in upper respiratory specimens during the acute phase of infection. The lowest concentration of SARS-CoV-2 viral copies this assay can detect is 138 copies/mL. A negative result does not preclude SARS-Cov-2 infection and should not be used as the sole basis for treatment or other patient management decisions. A negative result may occur with  improper specimen collection/handling, submission of specimen other than nasopharyngeal swab, presence of viral mutation(s) within the areas targeted by this assay, and inadequate number of viral copies(<138 copies/mL). A negative result must be combined with clinical observations, patient history, and epidemiological information. The expected result is Negative.  Fact Sheet for Patients:  BloggerCourse.com  Fact Sheet for Healthcare Providers:  SeriousBroker.it  This  test is no t yet approved or cleared by the [place] FDA and  has been authorized for detection and/or diagnosis of SARS-CoV-2 by FDA under an Emergency Use Authorization (EUA). This EUA will remain  in effect (meaning this test can be used) for the duration of the COVID-19 declaration under Section 564(b)(1) of the Act, 21 U.S.C.section 360bbb-3(b)(1), unless the authorization is terminated  or revoked sooner.       Influenza A by PCR NEGATIVE NEGATIVE Final   Influenza B by PCR NEGATIVE NEGATIVE Final    Comment: (NOTE) The Xpert Xpress SARS-CoV-2/FLU/RSV plus assay is intended as an aid in the diagnosis of influenza from Nasopharyngeal swab specimens and should not be used as a sole basis for treatment. Nasal washings and aspirates are unacceptable for Xpert Xpress SARS-CoV-2/FLU/RSV testing.  Fact Sheet for Patients: BloggerCourse.com  Fact Sheet for Healthcare Providers: SeriousBroker.it  This test is not yet approved or cleared by the [place] FDA and has been authorized for detection and/or diagnosis of SARS-CoV-2 by FDA under an Emergency Use Authorization (EUA). This EUA will remain in effect (meaning this test can be used) for the duration of the COVID-19 declaration under Section 564(b)(1) of the Act, 21 U.S.C. section 360bbb-3(b)(1), unless the authorization is terminated or revoked.     Resp Syncytial Virus by PCR NEGATIVE NEGATIVE Final    Comment: (NOTE) Fact Sheet for Patients: BloggerCourse.com  Fact Sheet for Healthcare Providers: SeriousBroker.it  This test is not yet approved or cleared by the [place] FDA and has been authorized for detection and/or diagnosis of SARS-CoV-2 by FDA under an Emergency Use Authorization (EUA). This EUA will remain in effect (meaning this test can be used) for the duration of the COVID-19 declaration under  Section 564(b)(1) of the Act, 21 U.S.C. section 360bbb-3(b)(1), unless the authorization is terminated or revoked.  Performed at [facility], [address] [place], [place] [address]   Urine Culture (for pregnant, neutropenic or urologic patients or patients with an indwelling urinary catheter)     Status: None   Collection Time: 10/09/23  5:00 AM   Specimen: In/Out Cath Urine  Result Value Ref Range Status   Specimen Description   Final    IN/OUT CATH URINE Performed at [facility], [address], [place], [place] [address]    Special Requests   Final    NONE Performed at [facility], [address], [place], [place] [address]    Culture   Final    NO GROWTH Performed at [facility], [address], [place], [place] [address]    Report Status 10/10/2023 FINAL  Final  Labs: CBC: Recent Labs  Lab 10/11/23 0440 10/14/23 0558  WBC 11.7* 9.1  HGB 10.5* 11.0*  HCT 31.2* 32.7*  MCV 92.6 92.1  PLT 141* 199   Basic Metabolic Panel: Recent Labs  Lab 10/11/23 0440 10/14/23 0558 10/16/23 0441  NA 134* 136 135  K 3.9 3.8 4.0  CL 106 105 103  CO2 22 24 25   GLUCOSE 122* 103* 135*  BUN 23 17 13   CREATININE 0.99 0.72 0.81  CALCIUM 8.3* 8.4* 8.5*   CBG: Recent Labs  Lab 10/15/23 1137 10/15/23 1623 10/15/23 2103 10/16/23 0806 10/16/23 1153  GLUCAP 182* 232* 237* 139* 224*    Discharge time spent: greater than 30 minutes.  Signed: Vassie Loll, MD [facility] 10/16/2023

## 2023-10-16 NOTE — Care Management Important Message (Signed)
Important Message  Patient Details  Name: Dana Ellis MRN: [id number] Date of Birth: Aug 20, 1934   Important Message Given:  Yes - Medicare IM     Corey Harold 10/16/2023, 11:41 AM

## 2023-10-16 NOTE — Consult Note (Signed)
[facility] Liaison Note  10/16/2023  Dana Ellis 28-Oct-1934 [id number]  Location: screened the patient remotely at [facility] ED.  Insurance: [employer] Advantage   Dana Ellis is a 89 y.o. female who is a Primary Care Patient of Margo Aye, Kathleene Hazel, MD The patient was screened for  readmission hospitalization with noted low risk score for unplanned readmission risk with 1 IP in 6 months.  The patient was assessed for potential Care Management service needs for post hospital transition for care coordination. Review of patient's electronic medical record reveals patient was admitted with Complicated UTI. Pt will discharge to [facility]. Will alert the PAC-RN on pt's discharge disposition. Facility will continue to address pt's ongoing needs.   [facility] Care Management/Population Health does not replace or interfere with any arrangements made by the Inpatient Transition of Care team.   For questions contact:   Elliot Cousin, RN, [facility] Liaison [place]   [facility], Population Health Office Hours MTWF  8:00 am-6:00 pm Direct Dial: [phone] mobile [phone] [Office toll free line] Office Hours are M-F 8:30 - 5 pm [email or web address]

## 2023-10-16 NOTE — TOC Transition Note (Signed)
Transition of Care [facility]) - CM/SW Discharge Note   Patient Details  Name: Dana Ellis MRN: [id number] Date of Birth: Oct 04, 1934  Transition of Care [facility]) CM/SW Contact:  Karn Cassis, LCSW Phone Number: 10/16/2023, 12:59 PM   Clinical Narrative: Pt d/c today. [facility] can accept. Authorization received. Pt's son updated and agreeable. Pt will transfer with staff. RN given number to call report. D/C summary sent to SNF.       Final next level of care: Skilled Nursing Facility Barriers to Discharge: Barriers Resolved   Patient Goals and CMS Choice   Choice offered to / list presented to : Adult Children  Discharge Placement                Patient chooses bed at: [facility] Patient to be transferred to facility by: staff Name of family member notified: son Patient and family notified of of transfer: 10/16/23  Discharge Plan and Services Additional resources added to the After Visit Summary for   In-house Referral: Clinical Social Work   Post Acute Care Choice: Skilled Nursing Facility                               Social Determinants of Health (SDOH) Interventions SDOH Screenings   Tobacco Use: Low Risk  (10/08/2023)     Readmission Risk Interventions     No data to display

## 2023-10-17 ENCOUNTER — Non-Acute Institutional Stay (SKILLED_NURSING_FACILITY): Payer: Self-pay | Admitting: Adult Health

## 2023-10-17 ENCOUNTER — Encounter: Payer: Self-pay | Admitting: Adult Health

## 2023-10-17 DIAGNOSIS — J3089 Other allergic rhinitis: Secondary | ICD-10-CM | POA: Diagnosis not present

## 2023-10-17 DIAGNOSIS — N952 Postmenopausal atrophic vaginitis: Secondary | ICD-10-CM | POA: Diagnosis not present

## 2023-10-17 DIAGNOSIS — N184 Chronic kidney disease, stage 4 (severe): Secondary | ICD-10-CM

## 2023-10-17 DIAGNOSIS — E1169 Type 2 diabetes mellitus with other specified complication: Secondary | ICD-10-CM | POA: Insufficient documentation

## 2023-10-17 DIAGNOSIS — E876 Hypokalemia: Secondary | ICD-10-CM | POA: Diagnosis not present

## 2023-10-17 DIAGNOSIS — M1991 Primary osteoarthritis, unspecified site: Secondary | ICD-10-CM

## 2023-10-17 DIAGNOSIS — E559 Vitamin D deficiency, unspecified: Secondary | ICD-10-CM | POA: Diagnosis not present

## 2023-10-17 DIAGNOSIS — E43 Unspecified severe protein-calorie malnutrition: Secondary | ICD-10-CM | POA: Insufficient documentation

## 2023-10-17 DIAGNOSIS — I129 Hypertensive chronic kidney disease with stage 1 through stage 4 chronic kidney disease, or unspecified chronic kidney disease: Secondary | ICD-10-CM | POA: Diagnosis not present

## 2023-10-17 DIAGNOSIS — K219 Gastro-esophageal reflux disease without esophagitis: Secondary | ICD-10-CM

## 2023-10-17 DIAGNOSIS — E1122 Type 2 diabetes mellitus with diabetic chronic kidney disease: Secondary | ICD-10-CM | POA: Diagnosis not present

## 2023-10-17 DIAGNOSIS — A419 Sepsis, unspecified organism: Secondary | ICD-10-CM | POA: Diagnosis not present

## 2023-10-17 DIAGNOSIS — I700 Atherosclerosis of aorta: Secondary | ICD-10-CM | POA: Insufficient documentation

## 2023-10-17 DIAGNOSIS — Z794 Long term (current) use of insulin: Secondary | ICD-10-CM | POA: Insufficient documentation

## 2023-10-17 DIAGNOSIS — G40909 Epilepsy, unspecified, not intractable, without status epilepticus: Secondary | ICD-10-CM

## 2023-10-17 DIAGNOSIS — R6520 Severe sepsis without septic shock: Secondary | ICD-10-CM

## 2023-10-17 DIAGNOSIS — N390 Urinary tract infection, site not specified: Secondary | ICD-10-CM

## 2023-10-17 DIAGNOSIS — I4821 Permanent atrial fibrillation: Secondary | ICD-10-CM | POA: Diagnosis not present

## 2023-10-17 DIAGNOSIS — E785 Hyperlipidemia, unspecified: Secondary | ICD-10-CM

## 2023-10-17 DIAGNOSIS — D631 Anemia in chronic kidney disease: Secondary | ICD-10-CM | POA: Insufficient documentation

## 2023-10-17 NOTE — Progress Notes (Signed)
Location:  [facility] Room Number: [id number] Place of Service:  SNF (31)   CODE STATUS: full  Allergies  Allergen Reactions   Ace Inhibitors Cough   Pork-Derived Products     Family reports patient does not eat pork     Chief Complaint  Patient presents with   Hospitalization Follow-up    HPI:  She is a 89 year old long term resident who has been hospitalized from 10-08-23 through 10-16-23. Her medical history includes: hypertension; atrial fibrillation; type 2 diabetes mellitus; dementia. She presented to the ED from home with altered mental status and low glucose. She has recently diagnosed with early dementia. She does have a history of uti's. She was in bed with rigors; confused and too weak to get up.  History of recurrent multidrug resistant UTI: complicated with severe sepsis. On 09-03-23: urine culture that grew multi drug resistant klebsiella. Her blood cultures on 10-08-23 grew enterobacterales group demonstrating citrobacter freundii. ID was consulted. She was transitioned to setpra DS for 3 more days to complete antibiotic therapy while inpatient.  Diabetes type 2: A1c is 6.4.  She did have hypoglycemia due to poor oral intake. No further hypoglycemic episodes Acute metabolic encephalopathy superimposed on underlying dementia: her mentation issues worsened due to poor fluid volume hypoglycemia and presumed UTI.  She is here for short term rehab with her goal to return back home. She will continue to be followed for her chronic illnesses including:  Hyperlipidemia associated with type 2 diabetes mellitus:  Type 2 diabetes mellitus with stage 4 chronic kidney stage and hypertension:   Aortic atherosclerosis  CKD stage 4 due to type 2 diabetes mellitus:  Past Medical History:  Diagnosis Date   Arthritis    Hypertension     Past Surgical History:  Procedure Laterality Date   ABDOMINAL HYSTERECTOMY     JOINT REPLACEMENT     TOTAL KNEE ARTHROPLASTY       Social History   Socioeconomic History   Marital status: Divorced    Spouse name: Not on file   Number of children: Not on file   Years of education: Not on file   Highest education level: Not on file  Occupational History   Not on file  Tobacco Use   Smoking status: Never   Smokeless tobacco: Never  Vaping Use   Vaping status: Never Used  Substance and Sexual Activity   Alcohol use: No   Drug use: No   Sexual activity: Not Currently    Birth control/protection: Surgical  Other Topics Concern   Not on file  Social History Narrative   Not on file   Social Determinants of Health   Financial Resource Strain: Not on file  Food Insecurity: Not on file  Transportation Needs: Not on file  Physical Activity: Not on file  Stress: Not on file  Social Connections: Not on file  Intimate Partner Violence: Not on file   Family History  Problem Relation Age of Onset   CAD Mother    Arthritis Mother    CVA Father    Hypertension Father    Arthritis Brother       VITAL SIGNS BP (!) 142/71   Pulse 79   Temp 98.1 F (36.7 C)   Resp 20   Ht 5\' 2"  (1.575 m)   Wt 146 lb 12.8 oz (66.6 kg)   SpO2 98%   BMI 26.85 kg/m   Outpatient Encounter Medications as of 10/17/2023  Medication  Sig   amLODipine (NORVASC) 5 MG tablet Take 5 mg by mouth daily.   calcium-vitamin D (OSCAL WITH D) 500-200 MG-UNIT per tablet Take 1 tablet by mouth 2 (two) times daily.   citalopram (CELEXA) 20 MG tablet Take 20 mg by mouth daily.    cloNIDine (CATAPRES) 0.1 MG tablet Take 0.1 mg by mouth 2 (two) times daily.   conjugated estrogens (PREMARIN) vaginal cream Discard plastic applicator. Insert a blueberry size amount (approximately 1 gram) of cream on fingertip inside vagina at bedtime every night.   fluticasone (FLONASE) 50 MCG/ACT nasal spray Place 2 sprays into both nostrils daily as needed for rhinitis or allergies.   meloxicam (MOBIC) 7.5 MG tablet Take 1 tablet (7.5 mg total) by mouth  daily as needed for pain.   omeprazole (PRILOSEC) 20 MG capsule Take 20 mg by mouth daily.   polyethylene glycol (MIRALAX / GLYCOLAX) 17 g packet Take 17 g by mouth daily as needed for mild constipation.   potassium chloride SA (KLOR-CON M) 20 MEQ tablet Take 1 tablet (20 mEq total) by mouth daily. *Alternate taking one tablet daily and taking two tablets daily*   pravastatin (PRAVACHOL) 40 MG tablet Take 40 mg by mouth daily.    TRESIBA FLEXTOUCH 100 UNIT/ML FlexTouch Pen Inject 10-14 Units into the skin daily. Under 150, pt is administered 10 units. If levels are over 150, pt is administered 14 units.   Vitamin D, Ergocalciferol, (DRISDOL) 1.25 MG (50000 UNIT) CAPS capsule Take 50,000 Units by mouth once a week.   No facility-administered encounter medications on file as of 10/17/2023.     SIGNIFICANT DIAGNOSTIC EXAMS  TODAY  09-04-23: urine culture: klebsiella pneumoniae multidrug resistant  10-08-23: wbc 13.8; hgb 10.4; hct 31.9; mcv 93.5 plt 141; glucose 59; bun 23; creat 0.98; k+ 2.5; na++ 131; ca 8.3; gfr 56; protein 6.4 albumin 2.8; mag 1.2; hgb A1c 6.4 blood culture: citrobacter freundii 10-09-23: wbc 22.9; hgb 9.8 hct 29.3 mcv 92.4 plt 130; glucose 94; bun 23; creat 0.97; k+ 3.9; na++ 132; ca 7.8; gfr 57; urine culture: no growth 10-14-23: wbc 9.1; hgb 11.0; hct 32.9; mcv 92.1 plt 199; glucose 103; bun 17; creat 0.72; k+ 3.8; na++ 136; ca 8.4; gfr >60  Review of Systems  Constitutional:  Negative for malaise/fatigue.  Respiratory:  Negative for cough and shortness of breath.   Cardiovascular:  Negative for chest pain, palpitations and leg swelling.  Gastrointestinal:  Negative for abdominal pain, constipation and heartburn.  Musculoskeletal:  Negative for back pain, joint pain and myalgias.  Skin: Negative.   Neurological:  Negative for dizziness.  Psychiatric/Behavioral:  The patient is not nervous/anxious.    Physical Exam Constitutional:      General: She is not in acute  distress.    Appearance: She is well-developed. She is not diaphoretic.  Neck:     Thyroid: No thyromegaly.  Cardiovascular:     Rate and Rhythm: Normal rate and regular rhythm.     Heart sounds: Normal heart sounds.  Pulmonary:     Effort: Pulmonary effort is normal. No respiratory distress.     Breath sounds: Normal breath sounds.  Abdominal:     General: Bowel sounds are normal. There is no distension.     Palpations: Abdomen is soft.     Tenderness: There is no abdominal tenderness.  Musculoskeletal:        General: Normal range of motion.     Cervical back: Neck supple.     Right  lower leg: No edema.     Left lower leg: No edema.  Lymphadenopathy:     Cervical: No cervical adenopathy.  Skin:    General: Skin is warm and dry.  Neurological:     Mental Status: She is alert. Mental status is at baseline.  Psychiatric:        Mood and Affect: Mood normal.      ASSESSMENT/ PLAN:  TODAY  Severe sepsis/complicated UTI; has completed antibiotics while in the hospital. Will continue therapy as directed to improve upon her level of independence with her adls.   2. Hyperlipidemia associated with type 2 diabetes mellitus: will continue pravachol 40 mg daily   3. Type 2 diabetes mellitus with stage 4 chronic kidney stage and hypertension: hgb A1c 6.4; she was admitted with hypoglycemia; will continue tresiba 12 units nightly at this time; may need to make further adjustments.   4. Aortic atherosclerosis (ct 06-25-19) is on statin  5. CKD stage 4 due to type 2 diabetes mellitus: bun 17; creat 0.72; gfr >60  6. Benign hypertension with CKD (chronic kidney disease) stage 4: b/p 142/71: will continue norvasc 5 mg daily; will increase clonidine 0.2 mg twice daily   7. Permanent atrial fibrillation: heart rate is stable  8. Gastroesophageal reflux disease without esophagitis: will continue prilosec 20 mg daily   9. Non-seasonal allergic rhinitis unspecified trigger: will continue  flonase daily as needed  10. Vitamin D deficiency: will continue 50,000 units weekly and will repeat level  11. Hypokalemia: k+ 3.8 will continue alternating doses of 20 meq and 40 meq daily  12. Atrophic vaginitis: will continue premarin cream nightly   13. Anemia in stage 4 chronic kidney disease: hgb 11.0  14. Primary osteoarthritis, unspecified site: has mobic 7.5 mg daily as needed  15. Seizure disorder: no reports of recent activity  16. Protein calorie malnutrition severe; albumin 2.8 will begin prosource 30 mL three times daily   17. Recurrent depression: will continue lexapro 20 mg daily   18. Thrombocytopenia: plt 199    Synthia Innocent NP [facility]   call [phone]

## 2023-10-18 ENCOUNTER — Encounter: Payer: Self-pay | Admitting: Adult Health

## 2023-10-18 NOTE — Progress Notes (Signed)
This encounter was created in error - please disregard.

## 2023-10-19 ENCOUNTER — Other Ambulatory Visit (HOSPITAL_COMMUNITY)
Admission: RE | Admit: 2023-10-19 | Discharge: 2023-10-19 | Disposition: A | Discharge status: Home / Self Care | Payer: Medicare HMO | Source: Skilled Nursing Facility | Attending: Adult Health | Admitting: Adult Health

## 2023-10-19 ENCOUNTER — Non-Acute Institutional Stay (SKILLED_NURSING_FACILITY): Payer: Medicare HMO | Admitting: Internal Medicine

## 2023-10-19 ENCOUNTER — Encounter: Payer: Self-pay | Admitting: Internal Medicine

## 2023-10-19 ENCOUNTER — Other Ambulatory Visit: Payer: Self-pay | Admitting: *Deleted

## 2023-10-19 DIAGNOSIS — E1351 Other specified diabetes mellitus with diabetic peripheral angiopathy without gangrene: Secondary | ICD-10-CM

## 2023-10-19 DIAGNOSIS — N390 Urinary tract infection, site not specified: Secondary | ICD-10-CM | POA: Diagnosis not present

## 2023-10-19 DIAGNOSIS — G9341 Metabolic encephalopathy: Secondary | ICD-10-CM

## 2023-10-19 DIAGNOSIS — E11649 Type 2 diabetes mellitus with hypoglycemia without coma: Secondary | ICD-10-CM | POA: Insufficient documentation

## 2023-10-19 LAB — COMPREHENSIVE METABOLIC PANEL
ALT: 23 U/L (ref 0–44)
AST: 25 U/L (ref 15–41)
Albumin: 2.6 g/dL — ABNORMAL LOW (ref 3.5–5.0)
Alkaline Phosphatase: 102 U/L (ref 38–126)
Anion gap: 7 (ref 5–15)
BUN: 27 mg/dL — ABNORMAL HIGH (ref 8–23)
CO2: 22 mmol/L (ref 22–32)
Calcium: 8.6 mg/dL — ABNORMAL LOW (ref 8.9–10.3)
Chloride: 104 mmol/L (ref 98–111)
Creatinine, Ser: 0.94 mg/dL (ref 0.44–1.00)
GFR, Estimated: 59 mL/min — ABNORMAL LOW (ref >60)
Glucose, Bld: 189 mg/dL — ABNORMAL HIGH (ref 70–99)
Potassium: 4.7 mmol/L (ref 3.5–5.1)
Sodium: 133 mmol/L — ABNORMAL LOW (ref 135–145)
Total Bilirubin: 0.4 mg/dL (ref 0.3–1.2)
Total Protein: 6.7 g/dL (ref 6.5–8.1)

## 2023-10-19 LAB — MAGNESIUM: Magnesium: 1.8 mg/dL (ref 1.7–2.4)

## 2023-10-19 LAB — CBC
HCT: 29.8 % — ABNORMAL LOW (ref 36.0–46.0)
Hemoglobin: 9.2 g/dL — ABNORMAL LOW (ref 12.0–15.0)
MCH: 30 pg (ref 26.0–34.0)
MCHC: 30.9 g/dL (ref 30.0–36.0)
MCV: 97.1 fL (ref 80.0–100.0)
Platelets: 392 10*3/uL (ref 150–400)
RBC: 3.07 MIL/uL — ABNORMAL LOW (ref 3.87–5.11)
RDW: 15.9 % — ABNORMAL HIGH (ref 11.5–15.5)
WBC: 7.3 10*3/uL (ref 4.0–10.5)
nRBC: 0 % (ref 0.0–0.2)

## 2023-10-19 LAB — VITAMIN D 25 HYDROXY (VIT D DEFICIENCY, FRACTURES): Vit D, 25-Hydroxy: 76.23 ng/mL (ref 30–100)

## 2023-10-19 NOTE — Assessment & Plan Note (Deleted)
It would appear that she would be an excellent candidate for monitoring device because of the profound variability in the glucoses.  Her CT of the CNS without contrast suggest possible vascular dementia.  It is critical to avoid hypoglycemic episodes which would mimic TIAs.

## 2023-10-19 NOTE — Assessment & Plan Note (Addendum)
Today she confabulates.  She gave the date as "27??."  She has little or no comprehension of the hospitalization diagnoses. Clinically significant vascular dementia is suspected as her CT revealed  atherosclerosis in intracranial carotid & vertebral arteries , cerebral atrophy & periventricular white matter changes  likely sequela of chronic small vessel ischemic disease suggesting possible vascular dementia.  MMSE will be completed at the SNF.

## 2023-10-19 NOTE — Patient Instructions (Signed)
See assessment and plan under each diagnosis in the problem list and acutely for this visit 

## 2023-10-19 NOTE — Assessment & Plan Note (Addendum)
Hemoglobin A1c was 6.4%.  Glucoses are markedly variable with ranges from 40 up to 468.  She denies using insulin and states that her glucoses at home are "200-3.". A1c goal would be less than 8 as her greatest risk is recurrent hypoglycemia which would mimic TIAs & exacerbate her neurocognitive issues. If insulin is necessary for DM control;continuous glucose monitoring device could be considered because of the profound variability in the glucoses.  It is critical to avoid hypoglycemic episodes which would mimic TIAs.

## 2023-10-19 NOTE — Assessment & Plan Note (Addendum)
S/P full course of antibiotics. She denies any GU symptoms.

## 2023-10-19 NOTE — Patient Outreach (Signed)
Ms. Sweney resides in [facility] skilled [facility] facility.  Screening for potential chronic care management services as a benefit of health plan and primary care provider.  Communication sent to facility SW to make aware writer is following for transition plans and chronic care management needs.   Will continue to follow.   Raiford Noble, MSN, RN, BSN [place]  [facility], [facility] RN Post- Acute Care Coordinator Direct Dial: [phone]

## 2023-10-19 NOTE — Progress Notes (Addendum)
NURSING HOME LOCATION:  [facility] ROOM NUMBER:  [id number]  CODE STATUS: Full Code    PCP:  Kathleene Hazel. Margo Aye MD  This is a comprehensive admission note to this SNFperformed on this date less than 30 days from date of admission. Included are preadmission medical/surgical history; reconciled medication list; family history; social history and comprehensive review of systems.  Corrections and additions to the records were documented. Comprehensive physical exam was also performed. Additionally a clinical summary was entered for each active diagnosis pertinent to this admission in the Problem List to enhance continuity of care.  HPI: She was hospitalized 10/20 - 10/16/2023 with complicated UTI.  She was brought to the ED from home with AMS and hypoglycemia.  In the ED she was oriented to person only and able to answer only simple questions.  According to her son who lives close to the patient she has been "talking nonsense and exhibiting weakness."  She does have a history of recurrent UTIs. Son stated that typically she is up by 9:30 am, but she remained in bed, too weak to rise from.  Reportedly glucose was 47. In the ED respiratory rate varied from 16-32.  Potassium was 2.5.  White count was 13,800.  Lactic acid peaked at 2.6.  EKG revealed atrial fibrillation with rate in 80s.  CT of the head without contrast revealed atherosclerotic changes in intracranial carotid &  vertebral arteries; cerebral atrophy; & periventricular white matter changes likely the sequela of chronic small vessel ischemic disease.  Repeat glucose was 59.  Empiric antibiotics were initiated with Meropenem. Urine culture on 9/15 had grown multiresistant Klebsiella. Blood cultures revealed Enterobacter & Citrobacter species. ID consulted and transitioned regimen to Bactrim twice daily for 3 additional days.  Antibiotic therapy was completed as an inpatient. While hospitalized glucoses ranged from a low of 40 up to a  high of 468, both outliers.  A1c was 6.4 indicating excellent control PTA.  The hypoglycemia was attributed to poor oral intake. The acute metabolic encephalopathy was attributed to the infection, dehydration, and hypoglycemia. Hyponatremia and hypokalemia were repleted.  Sodium ranged from 131 up to high of 136.  Prior to discharge value was 135.  The nadir potassium was 2.5 but otherwise ranged from 3.8 up to 4.0.  Nadir GFR was 55 with final values of greater than 60.  BUN was never elevated and ranged from the final value of 13 up to high of 23. H/H varied from a low of 9.8/29.3 up to final values of 11.0/32.7.  Indices were normochromic, normocytic.Marland Kitchen  Thrombocytopenia was documented with nadir platelet count 130,000.  Final value was 199,000. She exhibited PAF; she is not on rate controlling agents on a maintenance basis.  She also was not on anticoagulation PTA.  Rate was stable and controlled. PT/OT recommended rehab for her physical deconditioning.  Past medical and surgical history: includes essential hypertension , dyslipidemia,GERD, DJD , and diabetes with vascular complications. Surgeries & procedures include TKA & TAH.  Family history: reviewed, non contributory due to advanced age.  Social history: non drinker ; non smoker.   Review of systems: Clinical neurocognitive deficits made validity of responses questionable , compromising ROS completion. Date given only as "19??."  She has no comprehension as to why she was hospitalized.  She initially said she was not admitted to the hospital.  She denies insulin use.  She focused on "simply because I am having to take therapy for my bones "  She  then stated that she had blacked out and the rescue squad had taken her to the hospital.  She denies any hypoglycemia.  She did state "they did find I have sugar & getting some medicine & certain things I can't eat. She also states that "I watch my salt" because of blood pressure. At this time she  states that "I am a different person, no problems." She states that she has occasional sinus headache.  She also validates some constipation.  She does describe depression on occasion.  Constitutional: No fever, significant weight change, fatigue  Eyes: No redness, discharge, pain, vision change ENT/mouth: No nasal congestion, purulent discharge, earache, change in hearing, sore throat  Cardiovascular: No chest pain, palpitations, paroxysmal nocturnal dyspnea, claudication, edema  Respiratory: No cough, sputum production, hemoptysis, DOE, significant snoring, apnea Gastrointestinal: No heartburn, dysphagia, abdominal pain, nausea /vomiting, rectal bleeding, melena Genitourinary: No dysuria, hematuria, pyuria, incontinence, nocturia Dermatologic: No rash, pruritus, change in appearance of skin Neurologic: No dizziness, seizures, numbness, tingling Psychiatric: No significant anxiety,  insomnia, anorexia Endocrine: No change in hair/skin/nails, excessive thirst, excessive hunger, excessive urination  Hematologic/lymphatic: No significant bruising, lymphadenopathy, abnormal bleeding Allergy/immunology: No itchy/watery eyes, significant sneezing, urticaria, angioedema  Physical exam:  Pertinent or positive findings: She appears her age and suboptimally nourished.  She is very pleasant and interactive but does confabulate.  She has complete dentures.  The right thyroid lobe is not palpable.  The left is clinically normal.  The first and second heart sounds are increased.  Clinically the rhythm is regular.  She has minor low-grade rhonchi in the right lower lobe.  Pedal pulses are decreased.  She has 1+ pitting edema.  Marked interosseous wasting is present.  General appearance: no acute distress, increased work of breathing is present.   Lymphatic: No lymphadenopathy about the head, neck, axilla. Eyes: No conjunctival inflammation or lid edema is present. There is no scleral icterus. Ears:  External  ear exam shows no significant lesions or deformities.   Nose:  External nasal examination shows no deformity or inflammation. Nasal mucosa are pink and moist without lesions, exudates Neck:  No thyromegaly, masses, tenderness noted.    Heart:  No gallop, murmur, click, rub.  Lungs: without wheezes, rubs. Abdomen: Bowel sounds are normal.  Abdomen is soft and nontender with no organomegaly, hernias, masses. GU: Deferred  Extremities:  No cyanosis, clubbing. Neurologic exam: Balance, Rhomberg, finger to nose testing could not be completed due to clinical state Skin: Warm & dry w/o tenting. No significant lesions or rash.  See clinical summary under each active problem in the Problem List with associated updated therapeutic plan

## 2023-10-24 ENCOUNTER — Non-Acute Institutional Stay (SKILLED_NURSING_FACILITY): Payer: Medicare HMO | Admitting: Adult Health

## 2023-10-24 DIAGNOSIS — R600 Localized edema: Secondary | ICD-10-CM | POA: Diagnosis not present

## 2023-10-24 DIAGNOSIS — E1122 Type 2 diabetes mellitus with diabetic chronic kidney disease: Secondary | ICD-10-CM | POA: Diagnosis not present

## 2023-10-24 DIAGNOSIS — N184 Chronic kidney disease, stage 4 (severe): Secondary | ICD-10-CM

## 2023-10-25 ENCOUNTER — Encounter: Payer: Self-pay | Admitting: Adult Health

## 2023-10-25 NOTE — Progress Notes (Unsigned)
Location:  [facility] Room Number: 148 Place of Service:  SNF (31)   CODE STATUS: full   Allergies  Allergen Reactions   Ace Inhibitors Cough   Pork-Derived Products     Family reports patient does not eat pork     Chief Complaint  Patient presents with   Acute Visit    Edema     HPI:  She has chronic edema for which she was taking lasix as needed at home. She is presently wearing TED hose. There are no reports of shortness of breath or chest pain. She has gained 4 pounds in the past week with her weight at 150.6 pounds.   Past Medical History:  Diagnosis Date   Arthritis    Hypertension     Past Surgical History:  Procedure Laterality Date   ABDOMINAL HYSTERECTOMY     JOINT REPLACEMENT     TOTAL KNEE ARTHROPLASTY      Social History   Socioeconomic History   Marital status: Divorced    Spouse name: Not on file   Number of children: Not on file   Years of education: Not on file   Highest education level: Not on file  Occupational History   Not on file  Tobacco Use   Smoking status: Never   Smokeless tobacco: Never  Vaping Use   Vaping status: Never Used  Substance and Sexual Activity   Alcohol use: No   Drug use: No   Sexual activity: Not Currently    Birth control/protection: Surgical  Other Topics Concern   Not on file  Social History Narrative   Not on file   Social Determinants of Health   Financial Resource Strain: Not on file  Food Insecurity: Not on file  Transportation Needs: Not on file  Physical Activity: Not on file  Stress: Not on file  Social Connections: Not on file  Intimate Partner Violence: Not on file   Family History  Problem Relation Age of Onset   CAD Mother    Arthritis Mother    CVA Father    Hypertension Father    Arthritis Brother       VITAL SIGNS BP 132/68   Pulse 67   Temp 97.6 F (36.4 C)   Resp 19   Ht 5\' 2"  (1.575 m)   Wt 150 lb 9.6 oz (68.3 kg)   SpO2 98%   BMI 27.55  kg/m   Outpatient Encounter Medications as of 10/24/2023  Medication Sig   amLODipine (NORVASC) 5 MG tablet Take 5 mg by mouth daily.   calcium-vitamin D (OSCAL WITH D) 500-200 MG-UNIT per tablet Take 1 tablet by mouth 2 (two) times daily.   citalopram (CELEXA) 20 MG tablet Take 20 mg by mouth daily.    cloNIDine (CATAPRES) 0.2 MG tablet Take 0.2 mg by mouth 2 (two) times daily.   conjugated estrogens (PREMARIN) vaginal cream Discard plastic applicator. Insert a blueberry size amount (approximately 1 gram) of cream on fingertip inside vagina at bedtime every night.   fluticasone (FLONASE) 50 MCG/ACT nasal spray Place 2 sprays into both nostrils daily as needed for rhinitis or allergies.   meloxicam (MOBIC) 7.5 MG tablet Take 1 tablet (7.5 mg total) by mouth daily as needed for pain.   omeprazole (PRILOSEC) 20 MG capsule Take 20 mg by mouth daily.   polyethylene glycol (MIRALAX / GLYCOLAX) 17 g packet Take 17 g by mouth daily as needed for mild constipation.   potassium chloride  SA (KLOR-CON M) 20 MEQ tablet Take 1 tablet (20 mEq total) by mouth daily. *Alternate taking one tablet daily and taking two tablets daily*   pravastatin (PRAVACHOL) 40 MG tablet Take 40 mg by mouth daily.    TRESIBA FLEXTOUCH 100 UNIT/ML FlexTouch Pen Inject 12 Units into the skin daily.   Vitamin D, Ergocalciferol, (DRISDOL) 1.25 MG (50000 UNIT) CAPS capsule Take 50,000 Units by mouth once a week.   No facility-administered encounter medications on file as of 10/24/2023.     SIGNIFICANT DIAGNOSTIC EXAMS  PREVIOUS   09-04-23: urine culture: klebsiella pneumoniae multidrug resistant  10-08-23: wbc 13.8; hgb 10.4; hct 31.9; mcv 93.5 plt 141; glucose 59; bun 23; creat 0.98; k+ 2.5; na++ 131; ca 8.3; gfr 56; protein 6.4 albumin 2.8; mag 1.2; hgb A1c 6.4 blood culture: citrobacter freundii 10-09-23: wbc 22.9; hgb 9.8 hct 29.3 mcv 92.4 plt 130; glucose 94; bun 23; creat 0.97; k+ 3.9; na++ 132; ca 7.8; gfr 57; urine  culture: no growth 10-14-23: wbc 9.1; hgb 11.0; hct 32.9; mcv 92.1 plt 199; glucose 103; bun 17; creat 0.72; k+ 3.8; na++ 136; ca 8.4; gfr >60  NO NEW LABS.   Review of Systems  Constitutional:  Negative for malaise/fatigue.  Respiratory:  Negative for cough and shortness of breath.   Cardiovascular:  Positive for leg swelling. Negative for chest pain, palpitations and orthopnea.  Gastrointestinal:  Negative for abdominal pain, constipation and heartburn.  Musculoskeletal:  Negative for back pain, joint pain and myalgias.  Skin: Negative.   Neurological:  Negative for dizziness.  Psychiatric/Behavioral:  The patient is not nervous/anxious.     Physical Exam Constitutional:      General: She is not in acute distress.    Appearance: She is well-developed. She is not diaphoretic.  Neck:     Thyroid: No thyromegaly.  Cardiovascular:     Rate and Rhythm: Normal rate and regular rhythm.     Pulses: Normal pulses.     Heart sounds: Normal heart sounds.  Pulmonary:     Effort: Pulmonary effort is normal. No respiratory distress.     Breath sounds: Normal breath sounds.  Abdominal:     General: Bowel sounds are normal. There is no distension.     Palpations: Abdomen is soft.     Tenderness: There is no abdominal tenderness.  Musculoskeletal:        General: Normal range of motion.     Cervical back: Neck supple.     Right lower leg: Edema present.     Left lower leg: Edema present.     Comments: 2-3+  Lymphadenopathy:     Cervical: No cervical adenopathy.  Skin:    General: Skin is warm and dry.  Neurological:     Mental Status: She is alert. Mental status is at baseline.  Psychiatric:        Mood and Affect: Mood normal.     ASSESSMENT/ PLAN:  TODAY  Bilateral lower extremity edema  CKD stage 4 due to type 2 diabetes mellitus  Will begin lasix 40 mg daily for 3 days then lasix 20 mg daily will repeat BMP on 11-02-23.    Synthia Innocent NP [facility]    call [phone]

## 2023-10-26 DIAGNOSIS — R600 Localized edema: Secondary | ICD-10-CM | POA: Insufficient documentation

## 2023-10-30 ENCOUNTER — Non-Acute Institutional Stay (SKILLED_NURSING_FACILITY): Payer: Medicare HMO | Admitting: Student

## 2023-10-30 DIAGNOSIS — G8929 Other chronic pain: Secondary | ICD-10-CM

## 2023-10-30 DIAGNOSIS — M25511 Pain in right shoulder: Secondary | ICD-10-CM | POA: Diagnosis not present

## 2023-10-30 NOTE — Progress Notes (Signed)
Location:  [facility]   Place of Service:      CODE STATUS: Full Code  Allergies  Allergen Reactions   Ace Inhibitors Cough   Pork-Derived Products     Family reports patient does not eat pork     No chief complaint on file.   HPI:  Shoulder Pain Patient complaining of shoulder pain on 11/8, given PRN (? Meloxicam 7.5 mg). Patient w/ hx of OA, and normal vitamin D / Calcium levels, checked 10/31, taking Vitamin D, daily.   Past Medical History:  Diagnosis Date   Arthritis    Hypertension     Past Surgical History:  Procedure Laterality Date   ABDOMINAL HYSTERECTOMY     JOINT REPLACEMENT     TOTAL KNEE ARTHROPLASTY      Social History   Socioeconomic History   Marital status: Divorced    Spouse name: Not on file   Number of children: Not on file   Years of education: Not on file   Highest education level: Not on file  Occupational History   Not on file  Tobacco Use   Smoking status: Never   Smokeless tobacco: Never  Vaping Use   Vaping status: Never Used  Substance and Sexual Activity   Alcohol use: No   Drug use: No   Sexual activity: Not Currently    Birth control/protection: Surgical  Other Topics Concern   Not on file  Social History Narrative   Not on file   Social Determinants of Health   Financial Resource Strain: Not on file  Food Insecurity: Not on file  Transportation Needs: Not on file  Physical Activity: Not on file  Stress: Not on file  Social Connections: Not on file  Intimate Partner Violence: Not on file   Family History  Problem Relation Age of Onset   CAD Mother    Arthritis Mother    CVA Father    Hypertension Father    Arthritis Brother       VITAL SIGNS There were no vitals taken for this visit.  Outpatient Encounter Medications as of 10/30/2023  Medication Sig   amLODipine (NORVASC) 5 MG tablet Take 5 mg by mouth daily.   calcium-vitamin D (OSCAL WITH D) 500-200 MG-UNIT per tablet Take 1 tablet by  mouth 2 (two) times daily.   citalopram (CELEXA) 20 MG tablet Take 20 mg by mouth daily.    cloNIDine (CATAPRES) 0.2 MG tablet Take 0.2 mg by mouth 2 (two) times daily.   conjugated estrogens (PREMARIN) vaginal cream Discard plastic applicator. Insert a blueberry size amount (approximately 1 gram) of cream on fingertip inside vagina at bedtime every night.   fluticasone (FLONASE) 50 MCG/ACT nasal spray Place 2 sprays into both nostrils daily as needed for rhinitis or allergies.   furosemide (LASIX) 20 MG tablet Take 20 mg by mouth daily.   meloxicam (MOBIC) 7.5 MG tablet Take 1 tablet (7.5 mg total) by mouth daily as needed for pain.   omeprazole (PRILOSEC) 20 MG capsule Take 20 mg by mouth daily.   polyethylene glycol (MIRALAX / GLYCOLAX) 17 g packet Take 17 g by mouth daily as needed for mild constipation.   potassium chloride SA (KLOR-CON M) 20 MEQ tablet Take 1 tablet (20 mEq total) by mouth daily. *Alternate taking one tablet daily and taking two tablets daily*   pravastatin (PRAVACHOL) 40 MG tablet Take 40 mg by mouth daily.    TRESIBA FLEXTOUCH 100 UNIT/ML FlexTouch Pen Inject 12 Units  into the skin daily.   Vitamin D, Ergocalciferol, (DRISDOL) 1.25 MG (50000 UNIT) CAPS capsule Take 50,000 Units by mouth once a week.   No facility-administered encounter medications on file as of 10/30/2023.     SIGNIFICANT DIAGNOSTIC EXAMS       ASSESSMENT/ PLAN: Shoulder Pain  Patient w/ Hx of OA and no concern for fall.trauma, complained of shoulder pain, treated with meloxicam 7.5 mg. Patient with recent check of Calcium and Vitamin D level, both normal, low concern for Osteoporosis. No hx of trauma or fall, low concern for Fx. Pain felt to be likely 2/2 to OA, or overuse, as she was working in the garden the week prior to admission. Will recommend continued Tx w/ scheduled Meloxicam. -Scheduled meloxicam    Synthia Innocent NP [facility]  Contact [phone] Monday through  Friday 8am- 5pm  After hours call [phone]

## 2023-11-02 ENCOUNTER — Other Ambulatory Visit (HOSPITAL_COMMUNITY)
Admission: RE | Admit: 2023-11-02 | Discharge: 2023-11-02 | Disposition: A | Discharge status: Home / Self Care | Payer: Medicare HMO | Source: Skilled Nursing Facility | Attending: Adult Health | Admitting: Adult Health

## 2023-11-02 DIAGNOSIS — I129 Hypertensive chronic kidney disease with stage 1 through stage 4 chronic kidney disease, or unspecified chronic kidney disease: Secondary | ICD-10-CM | POA: Insufficient documentation

## 2023-11-02 LAB — BASIC METABOLIC PANEL
Anion gap: 8 (ref 5–15)
BUN: 28 mg/dL — ABNORMAL HIGH (ref 8–23)
CO2: 23 mmol/L (ref 22–32)
Calcium: 8.1 mg/dL — ABNORMAL LOW (ref 8.9–10.3)
Chloride: 106 mmol/L (ref 98–111)
Creatinine, Ser: 0.8 mg/dL (ref 0.44–1.00)
GFR, Estimated: >60 mL/min (ref >60)
Glucose, Bld: 74 mg/dL (ref 70–99)
Potassium: 3.6 mmol/L (ref 3.5–5.1)
Sodium: 137 mmol/L (ref 135–145)

## 2023-11-02 LAB — BRAIN NATRIURETIC PEPTIDE: B Natriuretic Peptide: 218 pg/mL — ABNORMAL HIGH (ref 0.0–100.0)

## 2023-11-03 ENCOUNTER — Non-Acute Institutional Stay (INDEPENDENT_AMBULATORY_CARE_PROVIDER_SITE_OTHER): Payer: Medicare HMO | Admitting: Adult Health

## 2023-11-03 ENCOUNTER — Encounter: Payer: Self-pay | Admitting: Adult Health

## 2023-11-03 DIAGNOSIS — I129 Hypertensive chronic kidney disease with stage 1 through stage 4 chronic kidney disease, or unspecified chronic kidney disease: Secondary | ICD-10-CM | POA: Diagnosis not present

## 2023-11-03 DIAGNOSIS — E1122 Type 2 diabetes mellitus with diabetic chronic kidney disease: Secondary | ICD-10-CM | POA: Diagnosis not present

## 2023-11-03 DIAGNOSIS — I700 Atherosclerosis of aorta: Secondary | ICD-10-CM | POA: Diagnosis not present

## 2023-11-03 DIAGNOSIS — G40909 Epilepsy, unspecified, not intractable, without status epilepticus: Secondary | ICD-10-CM | POA: Diagnosis not present

## 2023-11-03 DIAGNOSIS — N184 Chronic kidney disease, stage 4 (severe): Secondary | ICD-10-CM

## 2023-11-03 NOTE — Progress Notes (Signed)
Location:  [facility] Room Number: 148 Place of Service:  SNF (31)   CODE STATUS: full   Allergies  Allergen Reactions   Ace Inhibitors Cough   Pork-Derived Products     Family reports patient does not eat pork     Chief Complaint  Patient presents with   Acute Visit    Care plan meeting     HPI:  We have come together for her care plan meeting. Family present.  BIMS 7/15; BCAT 22/50; mood 6/30: decreased energy; nervous; some depression. Uses wheelchair without falls. She requires moderate to max assist with her adls. She is occasionally incontinent of bladder and bowel. Dietary: regular diet: supervision for meals; good appetite weight is 146.6 pounds. Therapy: ambulate 40 feet with walker and min assist; supervision for upper body; min assist lower body. Min assist for transfers; no steps at this time; BRP contact guard. Recent decreased participation.  Activities: does attend. She will continue to be followed for her chronic illnesses including:   Aortic atherosclerosis  Type 2 diabetes mellitus with stage 4 chronic kidney disease and hypertension   Seizure disorder  Past Medical History:  Diagnosis Date   Arthritis    Hypertension     Past Surgical History:  Procedure Laterality Date   ABDOMINAL HYSTERECTOMY     JOINT REPLACEMENT     TOTAL KNEE ARTHROPLASTY      Social History   Socioeconomic History   Marital status: Divorced    Spouse name: Not on file   Number of children: Not on file   Years of education: Not on file   Highest education level: Not on file  Occupational History   Not on file  Tobacco Use   Smoking status: Never   Smokeless tobacco: Never  Vaping Use   Vaping status: Never Used  Substance and Sexual Activity   Alcohol use: No   Drug use: No   Sexual activity: Not Currently    Birth control/protection: Surgical  Other Topics Concern   Not on file  Social History Narrative   Not on file   Social Determinants  of Health   Financial Resource Strain: Not on file  Food Insecurity: Not on file  Transportation Needs: Not on file  Physical Activity: Not on file  Stress: Not on file  Social Connections: Not on file  Intimate Partner Violence: Not on file   Family History  Problem Relation Age of Onset   CAD Mother    Arthritis Mother    CVA Father    Hypertension Father    Arthritis Brother       VITAL SIGNS BP 116/68   Pulse 60   Temp (!) 97.2 F (36.2 C)   Ht 5\' 2"  (1.575 m)   Wt 146 lb 9.6 oz (66.5 kg)   BMI 26.81 kg/m   Outpatient Encounter Medications as of 11/03/2023  Medication Sig   meloxicam (MOBIC) 7.5 MG tablet Take 7.5 mg by mouth daily.   amLODipine (NORVASC) 5 MG tablet Take 5 mg by mouth daily.   calcium-vitamin D (OSCAL WITH D) 500-200 MG-UNIT per tablet Take 1 tablet by mouth 2 (two) times daily.   citalopram (CELEXA) 20 MG tablet Take 20 mg by mouth daily.    cloNIDine (CATAPRES) 0.2 MG tablet Take 0.2 mg by mouth 2 (two) times daily.   conjugated estrogens (PREMARIN) vaginal cream Discard plastic applicator. Insert a blueberry size amount (approximately 1 gram) of cream on fingertip  inside vagina at bedtime every night.   fluticasone (FLONASE) 50 MCG/ACT nasal spray Place 2 sprays into both nostrils daily as needed for rhinitis or allergies.   furosemide (LASIX) 20 MG tablet Take 20 mg by mouth daily.   omeprazole (PRILOSEC) 20 MG capsule Take 20 mg by mouth daily.   polyethylene glycol (MIRALAX / GLYCOLAX) 17 g packet Take 17 g by mouth daily as needed for mild constipation.   potassium chloride SA (KLOR-CON M) 20 MEQ tablet Take 1 tablet (20 mEq total) by mouth daily. *Alternate taking one tablet daily and taking two tablets daily*   pravastatin (PRAVACHOL) 40 MG tablet Take 40 mg by mouth daily.    TRESIBA FLEXTOUCH 100 UNIT/ML FlexTouch Pen Inject 12 Units into the skin daily.   Vitamin D, Ergocalciferol, (DRISDOL) 1.25 MG (50000 UNIT) CAPS capsule Take 50,000  Units by mouth once a week.   [DISCONTINUED] meloxicam (MOBIC) 7.5 MG tablet Take 1 tablet (7.5 mg total) by mouth daily as needed for pain. (Patient taking differently: Take 7.5 mg by mouth daily.)   No facility-administered encounter medications on file as of 11/03/2023.     SIGNIFICANT DIAGNOSTIC EXAMS  PREVIOUS   09-04-23: urine culture: klebsiella pneumoniae multidrug resistant  10-08-23: wbc 13.8; hgb 10.4; hct 31.9; mcv 93.5 plt 141; glucose 59; bun 23; creat 0.98; k+ 2.5; na++ 131; ca 8.3; gfr 56; protein 6.4 albumin 2.8; mag 1.2; hgb A1c 6.4 blood culture: citrobacter freundii 10-09-23: wbc 22.9; hgb 9.8 hct 29.3 mcv 92.4 plt 130; glucose 94; bun 23; creat 0.97; k+ 3.9; na++ 132; ca 7.8; gfr 57; urine culture: no growth 10-14-23: wbc 9.1; hgb 11.0; hct 32.9; mcv 92.1 plt 199; glucose 103; bun 17; creat 0.72; k+ 3.8; na++ 136; ca 8.4; gfr >60  NO NEW LABS.  Review of Systems  Constitutional:  Negative for malaise/fatigue.  Respiratory:  Negative for cough and shortness of breath.   Cardiovascular:  Negative for chest pain, palpitations and leg swelling.  Gastrointestinal:  Negative for abdominal pain, constipation and heartburn.  Musculoskeletal:  Negative for back pain, joint pain and myalgias.  Skin: Negative.   Neurological:  Negative for dizziness.  Psychiatric/Behavioral:  The patient is not nervous/anxious.    Physical Exam Constitutional:      General: She is not in acute distress.    Appearance: She is well-developed. She is not diaphoretic.  Neck:     Thyroid: No thyromegaly.  Cardiovascular:     Rate and Rhythm: Normal rate and regular rhythm.     Pulses: Normal pulses.     Heart sounds: Normal heart sounds.  Pulmonary:     Effort: Pulmonary effort is normal. No respiratory distress.     Breath sounds: Normal breath sounds.  Abdominal:     General: Bowel sounds are normal. There is no distension.     Palpations: Abdomen is soft.     Tenderness: There is no  abdominal tenderness.  Musculoskeletal:        General: Normal range of motion.     Cervical back: Neck supple.     Right lower leg: Edema present.     Left lower leg: Edema present.  Lymphadenopathy:     Cervical: No cervical adenopathy.  Skin:    General: Skin is warm and dry.  Neurological:     Mental Status: She is alert. Mental status is at baseline.  Psychiatric:        Mood and Affect: Mood normal.  ASSESSMENT/ PLAN:  TODAY  Aortic atherosclerosis Type 2 diabetes mellitus with stage 4 chronic kidney disease and hypertension  Seizure disorder  Will continue current medications Will continue current plan of care Will continue to monitor her status.  More than likely this will be a long term placement   Time spent with patient: 40 minutes: therapy; medications; care plan.    Synthia Innocent NP [facility]  call [phone]

## 2023-11-06 ENCOUNTER — Other Ambulatory Visit: Payer: Self-pay | Admitting: *Deleted

## 2023-11-06 NOTE — Patient Outreach (Signed)
Post- Acute Care Manager follow up. Ms. Chouinard resides in  [place] skilled nursing facility.  Screening for potential chronic care management services as a benefit of health plan and primary care provider.  Update received from [place], SNF social worker.  Ms. Veerkamp lived at home alone prior. Her son lives next door. Transition plans are pending. May return home vs remain in facility. Projected dc date not given yet by insurance.  Will continue to follow.   Raiford Noble, MSN, RN, BSN [place]  [facility], [facility] RN Post- Acute Care Manager Direct Dial: [phone]

## 2023-11-07 ENCOUNTER — Other Ambulatory Visit: Payer: Self-pay | Admitting: Adult Health

## 2023-11-07 MED ORDER — INSULIN GLARGINE-YFGN 100 UNIT/ML ~~LOC~~ SOPN: 12.0000 [IU] | PEN_INJECTOR | Freq: Every day | SUBCUTANEOUS | 12 refills | Status: DC

## 2023-11-09 ENCOUNTER — Encounter: Payer: Self-pay | Admitting: Adult Health

## 2023-11-09 ENCOUNTER — Non-Acute Institutional Stay (SKILLED_NURSING_FACILITY): Payer: Medicare HMO | Admitting: Adult Health

## 2023-11-09 DIAGNOSIS — E785 Hyperlipidemia, unspecified: Secondary | ICD-10-CM

## 2023-11-09 DIAGNOSIS — I700 Atherosclerosis of aorta: Secondary | ICD-10-CM | POA: Diagnosis not present

## 2023-11-09 DIAGNOSIS — E1169 Type 2 diabetes mellitus with other specified complication: Secondary | ICD-10-CM | POA: Diagnosis not present

## 2023-11-09 DIAGNOSIS — I129 Hypertensive chronic kidney disease with stage 1 through stage 4 chronic kidney disease, or unspecified chronic kidney disease: Secondary | ICD-10-CM | POA: Diagnosis not present

## 2023-11-09 DIAGNOSIS — N184 Chronic kidney disease, stage 4 (severe): Secondary | ICD-10-CM | POA: Diagnosis not present

## 2023-11-09 DIAGNOSIS — E1122 Type 2 diabetes mellitus with diabetic chronic kidney disease: Secondary | ICD-10-CM

## 2023-11-09 NOTE — Progress Notes (Signed)
Location:  [facility] Room Number: 148-W Place of Service:  SNF (31)   CODE STATUS: Full Code  Allergies  Allergen Reactions   Ace Inhibitors Cough   Pork-Derived Products     Family reports patient does not eat pork     Chief Complaint  Patient presents with   Medical Management of Chronic Issues         Hyperlipidemia associated with type 2 diabetes mellitus:     Type 2 diabetes mellitus with stage 4 chronic kidney stage and hypertension:    Aortic atherosclerosis  CKD stage 4 due to type 2 diabetes mellitus:     HPI:  She is a 89 year old resident of this facility being seen for the management of her chronic illnesses:   Hyperlipidemia associated with type 2 diabetes mellitus:     Type 2 diabetes mellitus with stage 4 chronic kidney stage and hypertension:    Aortic atherosclerosis  CKD stage 4 due to type 2 diabetes mellitus. She has been bradycardic with heart rate in the 50-60s. And systolic blood pressure 110s -244W. She denies any chest pain no shortness of breath.   Past Medical History:  Diagnosis Date   Arthritis    Hypertension     Past Surgical History:  Procedure Laterality Date   ABDOMINAL HYSTERECTOMY     JOINT REPLACEMENT     TOTAL KNEE ARTHROPLASTY      Social History   Socioeconomic History   Marital status: Divorced    Spouse name: Not on file   Number of children: Not on file   Years of education: Not on file   Highest education level: Not on file  Occupational History   Not on file  Tobacco Use   Smoking status: Never   Smokeless tobacco: Never  Vaping Use   Vaping status: Never Used  Substance and Sexual Activity   Alcohol use: No   Drug use: No   Sexual activity: Not Currently    Birth control/protection: Surgical  Other Topics Concern   Not on file  Social History Narrative   Not on file   Social Determinants of Health   Financial Resource Strain: Not on file  Food Insecurity: Not on file   Transportation Needs: Not on file  Physical Activity: Not on file  Stress: Not on file  Social Connections: Not on file  Intimate Partner Violence: Not on file   Family History  Problem Relation Age of Onset   CAD Mother    Arthritis Mother    CVA Father    Hypertension Father    Arthritis Brother       VITAL SIGNS BP 137/67   Pulse 60   Temp 97.9 F (36.6 C)   Resp 20   Ht 5\' 2"  (1.575 m)   Wt 145 lb 3.2 oz (65.9 kg)   SpO2 100%   BMI 26.56 kg/m   Outpatient Encounter Medications as of 11/09/2023  Medication Sig   amLODipine (NORVASC) 5 MG tablet Take 5 mg by mouth daily.   calcium-vitamin D (OSCAL WITH D) 500-200 MG-UNIT per tablet Take 1 tablet by mouth 2 (two) times daily.   citalopram (CELEXA) 20 MG tablet Take 20 mg by mouth daily.    cloNIDine (CATAPRES) 0.2 MG tablet Take 0.2 mg by mouth 2 (two) times daily.   conjugated estrogens (PREMARIN) vaginal cream Discard plastic applicator. Insert a blueberry size amount (approximately 1 gram) of cream on fingertip inside vagina at  bedtime every night.   fluticasone (FLONASE) 50 MCG/ACT nasal spray Place 2 sprays into both nostrils daily as needed for rhinitis or allergies.   furosemide (LASIX) 20 MG tablet Take 20 mg by mouth daily.   insulin glargine-yfgn (SEMGLEE) 100 UNIT/ML Pen Inject 12 Units into the skin at bedtime.   meloxicam (MOBIC) 7.5 MG tablet Take 7.5 mg by mouth daily.   omeprazole (PRILOSEC) 20 MG capsule Take 20 mg by mouth daily.   polyethylene glycol (MIRALAX / GLYCOLAX) 17 g packet Take 17 g by mouth daily as needed for mild constipation.   potassium chloride SA (KLOR-CON M) 20 MEQ tablet Take 1 tablet (20 mEq total) by mouth daily. *Alternate taking one tablet daily and taking two tablets daily*   pravastatin (PRAVACHOL) 40 MG tablet Take 40 mg by mouth daily.    Protein (PROSOURCE PO) Take 30 mLs by mouth 3 (three) times daily.   Vitamin D, Ergocalciferol, (DRISDOL) 1.25 MG (50000 UNIT) CAPS  capsule Take 50,000 Units by mouth once a week.   No facility-administered encounter medications on file as of 11/09/2023.     SIGNIFICANT DIAGNOSTIC EXAMS  PREVIOUS   09-04-23: urine culture: klebsiella pneumoniae multidrug resistant  10-08-23: wbc 13.8; hgb 10.4; hct 31.9; mcv 93.5 plt 141; glucose 59; bun 23; creat 0.98; k+ 2.5; na++ 131; ca 8.3; gfr 56; protein 6.4 albumin 2.8; mag 1.2; hgb A1c 6.4 blood culture: citrobacter freundii 10-09-23: wbc 22.9; hgb 9.8 hct 29.3 mcv 92.4 plt 130; glucose 94; bun 23; creat 0.97; k+ 3.9; na++ 132; ca 7.8; gfr 57; urine culture: no growth 10-14-23: wbc 9.1; hgb 11.0; hct 32.9; mcv 92.1 plt 199; glucose 103; bun 17; creat 0.72; k+ 3.8; na++ 136; ca 8.4; gfr >60  TODAY  10-19-23: wbc 7.3; hgb 9.2; hct 29.8; mcv 97.1 plt 392; glucose 189; bun 27; creat 0.94; k+ 4.7; na++ 133; ca 8.6; gfr 59; protein 6.7 albumin 2.6 mag 1.8; vitamin D 76.23 11-02-23: glucose 74; bun 28; creat 0.80; k+ 3.6; na++ 137; ca 8.1; gfr >60; BNP 218  Review of Systems  Constitutional:  Negative for malaise/fatigue.  Respiratory:  Negative for cough and shortness of breath.   Cardiovascular:  Negative for chest pain, palpitations and leg swelling.  Gastrointestinal:  Negative for abdominal pain, constipation and heartburn.  Musculoskeletal:  Negative for back pain, joint pain and myalgias.  Skin: Negative.   Neurological:  Negative for dizziness.  Psychiatric/Behavioral:  The patient is not nervous/anxious.    Physical Exam Constitutional:      General: She is not in acute distress.    Appearance: She is well-developed. She is not diaphoretic.  Neck:     Thyroid: No thyromegaly.  Cardiovascular:     Rate and Rhythm: Normal rate and regular rhythm.     Pulses: Normal pulses.     Heart sounds: Normal heart sounds.  Pulmonary:     Effort: Pulmonary effort is normal. No respiratory distress.     Breath sounds: Normal breath sounds.  Abdominal:     General: Bowel  sounds are normal. There is no distension.     Palpations: Abdomen is soft.     Tenderness: There is no abdominal tenderness.  Musculoskeletal:        General: Normal range of motion.     Cervical back: Neck supple.     Right lower leg: Edema present.     Left lower leg: Edema present.  Lymphadenopathy:     Cervical: No cervical adenopathy.  Skin:    General: Skin is warm and dry.  Neurological:     Mental Status: She is alert. Mental status is at baseline.  Psychiatric:        Mood and Affect: Mood normal.      ASSESSMENT/ PLAN:  TODAY  1. Hyperlipidemia associated with type 2 diabetes mellitus: will continue pravachol 40 mg daily   2. Type 2 diabetes mellitus with stage 4 chronic kidney stage and hypertension: hgb A1c 6.4;   will continue semglee 12 units nightly   3. Aortic atherosclerosis (ct 06-25-19) is on statin  4. CKD stage 4 due to type 2 diabetes mellitus: bun 28; creat 0.80; gfr >60  PREVIOUS   5. Benign hypertension with CKD (chronic kidney disease) stage 4: b/p 137/67: will continue norvasc 5 mg daily; due to her bradycardia will lower clonidine back to 0.1 mg twice daily   6. Permanent atrial fibrillation: heart rate is stable  7. Gastroesophageal reflux disease without esophagitis: will continue prilosec 20 mg daily   8. Non-seasonal allergic rhinitis unspecified trigger: will continue flonase daily as needed  9. Vitamin D deficiency: level 76.23; will stop supplement   10. Hypokalemia: k+ 3.8 will continue alternating doses of 20 meq and 40 meq daily  11. Atrophic vaginitis: will continue premarin cream nightly   12. Anemia in stage 4 chronic kidney disease: hgb 11.0  13. Primary osteoarthritis, unspecified site: has mobic 7.5 mg daily as needed  14. Seizure disorder: no reports of recent activity  15. Protein calorie malnutrition severe; albumin 2.6 will continue prosource 30 mL three times daily   16. Recurrent depression: will continue lexapro  20 mg daily   17. Thrombocytopenia: plt 392           Synthia Innocent NP [facility]   call [phone]

## 2023-11-10 ENCOUNTER — Other Ambulatory Visit: Payer: Self-pay | Admitting: *Deleted

## 2023-11-10 NOTE — Patient Outreach (Signed)
Post- Acute Care Manager follow up. Ms. Dana Ellis resides in [facility]. Screening for potential chronic care management services as a benefit of health plan and primary care provider.  Update received from [facility] social worker. Ms. Dana Ellis appealed and won recent [facility]. Transition plan is to remain at [facility] in LTC. Son is applying for Medicaid.  Writer will sign off. No identifiable care management needs.   Raiford Noble, MSN, RN, BSN [place]  [facility], [facility] RN Post- Acute Care Manager Direct Dial: [phone]

## 2023-11-13 ENCOUNTER — Encounter: Payer: Self-pay | Admitting: Adult Health

## 2023-11-13 ENCOUNTER — Non-Acute Institutional Stay (SKILLED_NURSING_FACILITY): Payer: Medicare HMO | Admitting: Adult Health

## 2023-11-13 DIAGNOSIS — E1122 Type 2 diabetes mellitus with diabetic chronic kidney disease: Secondary | ICD-10-CM

## 2023-11-13 DIAGNOSIS — I129 Hypertensive chronic kidney disease with stage 1 through stage 4 chronic kidney disease, or unspecified chronic kidney disease: Secondary | ICD-10-CM | POA: Diagnosis not present

## 2023-11-13 DIAGNOSIS — N184 Chronic kidney disease, stage 4 (severe): Secondary | ICD-10-CM

## 2023-11-13 NOTE — Progress Notes (Unsigned)
Location:  [facility] Room Number: 148 Place of Service:  SNF (31)   CODE STATUS: full   Allergies  Allergen Reactions   Ace Inhibitors Cough   Pork-Derived Products     Family reports patient does not eat pork     Chief Complaint  Patient presents with   Acute Visit    Follow up CBG readings.     HPI:  She is presently taking semglee 12 units nightly. Her AM readings have been low 60-90's; with her PM readings 160-200's.  She denies any excessive hunger. There are no reports of anxiety or excessive thirst.   Past Medical History:  Diagnosis Date   Arthritis    Hypertension     Past Surgical History:  Procedure Laterality Date   ABDOMINAL HYSTERECTOMY     JOINT REPLACEMENT     TOTAL KNEE ARTHROPLASTY      Social History   Socioeconomic History   Marital status: Divorced    Spouse name: Not on file   Number of children: Not on file   Years of education: Not on file   Highest education level: Not on file  Occupational History   Not on file  Tobacco Use   Smoking status: Never   Smokeless tobacco: Never  Vaping Use   Vaping status: Never Used  Substance and Sexual Activity   Alcohol use: No   Drug use: No   Sexual activity: Not Currently    Birth control/protection: Surgical  Other Topics Concern   Not on file  Social History Narrative   Not on file   Social Determinants of Health   Financial Resource Strain: Not on file  Food Insecurity: Not on file  Transportation Needs: Not on file  Physical Activity: Not on file  Stress: Not on file  Social Connections: Not on file  Intimate Partner Violence: Not on file   Family History  Problem Relation Age of Onset   CAD Mother    Arthritis Mother    CVA Father    Hypertension Father    Arthritis Brother       VITAL SIGNS BP 119/63   Pulse 63   Temp 97.9 F (36.6 C)   Resp 18   Ht 5\' 2"  (1.575 m)   Wt 145 lb 3.2 oz (65.9 kg)   SpO2 98%   BMI 26.56 kg/m    Outpatient Encounter Medications as of 11/13/2023  Medication Sig   amLODipine (NORVASC) 5 MG tablet Take 5 mg by mouth daily.   calcium-vitamin D (OSCAL WITH D) 500-200 MG-UNIT per tablet Take 1 tablet by mouth 2 (two) times daily.   citalopram (CELEXA) 20 MG tablet Take 20 mg by mouth daily.    cloNIDine (CATAPRES) 0.1 MG tablet Take 0.1 mg by mouth 2 (two) times daily.   conjugated estrogens (PREMARIN) vaginal cream Discard plastic applicator. Insert a blueberry size amount (approximately 1 gram) of cream on fingertip inside vagina at bedtime every night.   fluticasone (FLONASE) 50 MCG/ACT nasal spray Place 2 sprays into both nostrils daily as needed for rhinitis or allergies.   furosemide (LASIX) 20 MG tablet Take 20 mg by mouth daily.   insulin glargine-yfgn (SEMGLEE) 100 UNIT/ML Pen Inject 12 Units into the skin at bedtime.   meloxicam (MOBIC) 7.5 MG tablet Take 7.5 mg by mouth daily.   omeprazole (PRILOSEC) 20 MG capsule Take 20 mg by mouth daily.   polyethylene glycol (MIRALAX / GLYCOLAX) 17 g packet Take  17 g by mouth daily as needed for mild constipation.   potassium chloride SA (KLOR-CON M) 20 MEQ tablet Take 1 tablet (20 mEq total) by mouth daily. *Alternate taking one tablet daily and taking two tablets daily*   pravastatin (PRAVACHOL) 40 MG tablet Take 40 mg by mouth daily.    Protein (PROSOURCE PO) Take 30 mLs by mouth 3 (three) times daily.   No facility-administered encounter medications on file as of 11/13/2023.     SIGNIFICANT DIAGNOSTIC EXAMS  PREVIOUS   09-04-23: urine culture: klebsiella pneumoniae multidrug resistant  10-08-23: wbc 13.8; hgb 10.4; hct 31.9; mcv 93.5 plt 141; glucose 59; bun 23; creat 0.98; k+ 2.5; na++ 131; ca 8.3; gfr 56; protein 6.4 albumin 2.8; mag 1.2; hgb A1c 6.4 blood culture: citrobacter freundii 10-09-23: wbc 22.9; hgb 9.8 hct 29.3 mcv 92.4 plt 130; glucose 94; bun 23; creat 0.97; k+ 3.9; na++ 132; ca 7.8; gfr 57; urine culture: no  growth 10-14-23: wbc 9.1; hgb 11.0; hct 32.9; mcv 92.1 plt 199; glucose 103; bun 17; creat 0.72; k+ 3.8; na++ 136; ca 8.4; gfr >60  TODAY  10-19-23: wbc 7.3; hgb 9.2; hct 29.8; mcv 97.1 plt 392; glucose 189; bun 27; creat 0.94; k+ 4.7; na++ 133; ca 8.6; gfr 59; protein 6.7 albumin 2.6 mag 1.8; vitamin D 76.23 11-02-23: glucose 74; bun 28; creat 0.80; k+ 3.6; na++ 137; ca 8.1; gfr >60; BNP 218  Review of Systems  Constitutional:  Negative for malaise/fatigue.  Respiratory:  Negative for cough and shortness of breath.   Cardiovascular:  Negative for chest pain, palpitations and leg swelling.  Gastrointestinal:  Negative for abdominal pain, constipation and heartburn.  Musculoskeletal:  Negative for back pain, joint pain and myalgias.  Skin: Negative.   Neurological:  Negative for dizziness.  Psychiatric/Behavioral:  The patient is not nervous/anxious.     Physical Exam Constitutional:      General: She is not in acute distress.    Appearance: She is well-developed. She is not diaphoretic.  Neck:     Thyroid: No thyromegaly.  Cardiovascular:     Rate and Rhythm: Normal rate and regular rhythm.     Pulses: Normal pulses.     Heart sounds: Normal heart sounds.  Pulmonary:     Effort: Pulmonary effort is normal. No respiratory distress.     Breath sounds: Normal breath sounds.  Abdominal:     General: Bowel sounds are normal. There is no distension.     Palpations: Abdomen is soft.     Tenderness: There is no abdominal tenderness.  Musculoskeletal:        General: Normal range of motion.     Cervical back: Neck supple.     Right lower leg: Edema present.     Left lower leg: Edema present.  Lymphadenopathy:     Cervical: No cervical adenopathy.  Skin:    General: Skin is warm and dry.  Neurological:     Mental Status: She is alert. Mental status is at baseline.  Psychiatric:        Mood and Affect: Mood normal.     ASSESSMENT/ PLAN:  TODAY  1. Type 2 diabetes mellitus  with stage 4 chronic kidney stage and hypertension: hgb A1c 6.4;   will lower semglee to 9 units nightly and will monitor her status.     Synthia Innocent NP [facility]   call [phone]

## 2023-11-19 ENCOUNTER — Other Ambulatory Visit (HOSPITAL_COMMUNITY)
Admission: RE | Admit: 2023-11-19 | Discharge: 2023-11-19 | Disposition: A | Discharge status: Home / Self Care | Payer: Medicare HMO | Source: Skilled Nursing Facility | Attending: Adult Health | Admitting: Adult Health

## 2023-11-19 DIAGNOSIS — N390 Urinary tract infection, site not specified: Secondary | ICD-10-CM | POA: Insufficient documentation

## 2023-11-19 LAB — URINALYSIS, ROUTINE W REFLEX MICROSCOPIC
Bilirubin Urine: NEGATIVE
Glucose, UA: 50 mg/dL — AB
Ketones, ur: NEGATIVE mg/dL
Nitrite: POSITIVE — AB
Protein, ur: 30 mg/dL — AB
Specific Gravity, Urine: 1.013 (ref 1.005–1.030)
WBC, UA: >50 WBC/hpf (ref 0–5)
pH: 7 (ref 5.0–8.0)

## 2023-11-22 ENCOUNTER — Non-Acute Institutional Stay (SKILLED_NURSING_FACILITY): Payer: Medicare HMO | Admitting: Adult Health

## 2023-11-22 ENCOUNTER — Encounter: Payer: Self-pay | Admitting: Adult Health

## 2023-11-22 DIAGNOSIS — N390 Urinary tract infection, site not specified: Secondary | ICD-10-CM

## 2023-11-22 DIAGNOSIS — B9620 Unspecified Escherichia coli [E. coli] as the cause of diseases classified elsewhere: Secondary | ICD-10-CM

## 2023-11-22 LAB — URINE CULTURE: Culture: >=100000 — AB

## 2023-11-22 NOTE — Progress Notes (Unsigned)
Location:  [facility] Room Number: 148 Place of Service:  SNF (31)   CODE STATUS: full code   Allergies  Allergen Reactions   Ace Inhibitors Cough   Pork-Derived Products     Family reports patient does not eat pork     Chief Complaint  Patient presents with   Acute Visit    E-coli UTI     HPI:  She had developed dysuria, foul odor, low back pain and reduced appetite. There are no reports of fevers present. She had a urine culture which demonstrated e-coli   Past Medical History:  Diagnosis Date   Arthritis    Hypertension     Past Surgical History:  Procedure Laterality Date   ABDOMINAL HYSTERECTOMY     JOINT REPLACEMENT     TOTAL KNEE ARTHROPLASTY      Social History   Socioeconomic History   Marital status: Divorced    Spouse name: Not on file   Number of children: Not on file   Years of education: Not on file   Highest education level: Not on file  Occupational History   Not on file  Tobacco Use   Smoking status: Never   Smokeless tobacco: Never  Vaping Use   Vaping status: Never Used  Substance and Sexual Activity   Alcohol use: No   Drug use: No   Sexual activity: Not Currently    Birth control/protection: Surgical  Other Topics Concern   Not on file  Social History Narrative   Not on file   Social Determinants of Health   Financial Resource Strain: Not on file  Food Insecurity: Not on file  Transportation Needs: Not on file  Physical Activity: Not on file  Stress: Not on file  Social Connections: Not on file  Intimate Partner Violence: Not on file   Family History  Problem Relation Age of Onset   CAD Mother    Arthritis Mother    CVA Father    Hypertension Father    Arthritis Brother       VITAL SIGNS BP 119/63   Pulse 78   Temp 98.1 F (36.7 C)   Resp 20   Ht 5\' 2"  (1.575 m)   Wt 140 lb 9.6 oz (63.8 kg)   SpO2 99%   BMI 25.72 kg/m   Outpatient Encounter Medications as of 11/22/2023   Medication Sig   amLODipine (NORVASC) 5 MG tablet Take 5 mg by mouth daily.   calcium-vitamin D (OSCAL WITH D) 500-200 MG-UNIT per tablet Take 1 tablet by mouth 2 (two) times daily.   citalopram (CELEXA) 20 MG tablet Take 20 mg by mouth daily.    cloNIDine (CATAPRES) 0.1 MG tablet Take 0.1 mg by mouth 2 (two) times daily.   fluticasone (FLONASE) 50 MCG/ACT nasal spray Place 2 sprays into both nostrils daily as needed for rhinitis or allergies.   furosemide (LASIX) 20 MG tablet Take 20 mg by mouth daily.   insulin glargine-yfgn (SEMGLEE) 100 UNIT/ML Pen Inject 9 Units into the skin at bedtime.   meloxicam (MOBIC) 7.5 MG tablet Take 7.5 mg by mouth daily.   omeprazole (PRILOSEC) 20 MG capsule Take 20 mg by mouth daily.   polyethylene glycol (MIRALAX / GLYCOLAX) 17 g packet Take 17 g by mouth daily as needed for mild constipation.   potassium chloride SA (KLOR-CON M) 20 MEQ tablet Take 1 tablet (20 mEq total) by mouth daily. *Alternate taking one tablet daily and taking two  tablets daily*   pravastatin (PRAVACHOL) 40 MG tablet Take 40 mg by mouth daily.    Protein (PROSOURCE PO) Take 30 mLs by mouth 3 (three) times daily.   No facility-administered encounter medications on file as of 11/22/2023.     SIGNIFICANT DIAGNOSTIC EXAMS  PREVIOUS   09-04-23: urine culture: klebsiella pneumoniae multidrug resistant  10-08-23: wbc 13.8; hgb 10.4; hct 31.9; mcv 93.5 plt 141; glucose 59; bun 23; creat 0.98; k+ 2.5; na++ 131; ca 8.3; gfr 56; protein 6.4 albumin 2.8; mag 1.2; hgb A1c 6.4 blood culture: citrobacter freundii 10-09-23: wbc 22.9; hgb 9.8 hct 29.3 mcv 92.4 plt 130; glucose 94; bun 23; creat 0.97; k+ 3.9; na++ 132; ca 7.8; gfr 57; urine culture: no growth 10-14-23: wbc 9.1; hgb 11.0; hct 32.9; mcv 92.1 plt 199; glucose 103; bun 17; creat 0.72; k+ 3.8; na++ 136; ca 8.4; gfr >60 10-19-23: wbc 7.3; hgb 9.2; hct 29.8; mcv 97.1 plt 392; glucose 189; bun 27; creat 0.94; k+ 4.7; na++ 133; ca 8.6; gfr  59; protein 6.7 albumin 2.6 mag 1.8; vitamin D 76.23 11-02-23: glucose 74; bun 28; creat 0.80; k+ 3.6; na++ 137; ca 8.1; gfr >60; BNP 218  TODAY  11-19-23: urine culture: e-coli: cipro   Review of Systems  Constitutional:  Negative for malaise/fatigue.  Respiratory:  Negative for cough and shortness of breath.   Cardiovascular:  Negative for chest pain, palpitations and leg swelling.  Gastrointestinal:  Negative for abdominal pain, constipation and heartburn.  Genitourinary:  Positive for dysuria and frequency.  Musculoskeletal:  Positive for back pain. Negative for joint pain and myalgias.  Skin: Negative.   Neurological:  Negative for dizziness.  Psychiatric/Behavioral:  The patient is not nervous/anxious.     Physical Exam Constitutional:      General: She is not in acute distress.    Appearance: She is well-developed. She is not diaphoretic.  Neck:     Thyroid: No thyromegaly.  Cardiovascular:     Rate and Rhythm: Normal rate and regular rhythm.     Pulses: Normal pulses.     Heart sounds: Normal heart sounds.  Pulmonary:     Effort: Pulmonary effort is normal. No respiratory distress.     Breath sounds: Normal breath sounds.  Abdominal:     General: Bowel sounds are normal. There is no distension.     Palpations: Abdomen is soft.     Tenderness: There is no abdominal tenderness.  Musculoskeletal:        General: Normal range of motion.     Cervical back: Neck supple.     Right lower leg: Edema present.     Left lower leg: Edema present.  Lymphadenopathy:     Cervical: No cervical adenopathy.  Skin:    General: Skin is warm and dry.  Neurological:     Mental Status: She is alert. Mental status is at baseline.  Psychiatric:        Mood and Affect: Mood normal.      ASSESSMENT/ PLAN:  TODAY  E-coli UTI; will begin cipro 500 mg twice daily through 11-26-23.    Synthia Innocent NP [facility]   call [phone]

## 2023-11-23 DIAGNOSIS — B9620 Unspecified Escherichia coli [E. coli] as the cause of diseases classified elsewhere: Secondary | ICD-10-CM | POA: Insufficient documentation

## 2023-11-23 DIAGNOSIS — N390 Urinary tract infection, site not specified: Secondary | ICD-10-CM | POA: Insufficient documentation

## 2023-12-01 ENCOUNTER — Non-Acute Institutional Stay (SKILLED_NURSING_FACILITY): Payer: Self-pay | Admitting: Adult Health

## 2023-12-01 ENCOUNTER — Other Ambulatory Visit: Payer: Self-pay | Admitting: Adult Health

## 2023-12-01 ENCOUNTER — Encounter: Payer: Self-pay | Admitting: Adult Health

## 2023-12-01 DIAGNOSIS — I700 Atherosclerosis of aorta: Secondary | ICD-10-CM | POA: Diagnosis not present

## 2023-12-01 DIAGNOSIS — N184 Chronic kidney disease, stage 4 (severe): Secondary | ICD-10-CM

## 2023-12-01 DIAGNOSIS — I129 Hypertensive chronic kidney disease with stage 1 through stage 4 chronic kidney disease, or unspecified chronic kidney disease: Secondary | ICD-10-CM | POA: Diagnosis not present

## 2023-12-01 DIAGNOSIS — G9341 Metabolic encephalopathy: Secondary | ICD-10-CM | POA: Diagnosis not present

## 2023-12-01 DIAGNOSIS — E1122 Type 2 diabetes mellitus with diabetic chronic kidney disease: Secondary | ICD-10-CM | POA: Diagnosis not present

## 2023-12-01 MED ORDER — AMLODIPINE BESYLATE 5 MG PO TABS: 5.0000 mg | ORAL_TABLET | Freq: Every day | ORAL | 0 refills | Status: AC

## 2023-12-01 MED ORDER — CITALOPRAM HYDROBROMIDE 20 MG PO TABS: 20.0000 mg | ORAL_TABLET | Freq: Every day | ORAL | 0 refills | Status: AC

## 2023-12-01 MED ORDER — MELOXICAM 7.5 MG PO TABS: 7.5000 mg | ORAL_TABLET | Freq: Every day | ORAL | 0 refills | Status: AC

## 2023-12-01 MED ORDER — POTASSIUM CHLORIDE CRYS ER 20 MEQ PO TBCR: 20.0000 meq | EXTENDED_RELEASE_TABLET | Freq: Every day | ORAL | 0 refills | Status: AC

## 2023-12-01 MED ORDER — PRAVASTATIN SODIUM 40 MG PO TABS: 40.0000 mg | ORAL_TABLET | Freq: Every day | ORAL | 0 refills | Status: AC

## 2023-12-01 MED ORDER — INSULIN GLARGINE-YFGN 100 UNIT/ML ~~LOC~~ SOPN: 9.0000 [IU] | PEN_INJECTOR | Freq: Every day | SUBCUTANEOUS | 0 refills | Status: AC

## 2023-12-01 MED ORDER — FUROSEMIDE 20 MG PO TABS: 20.0000 mg | ORAL_TABLET | Freq: Every day | ORAL | 0 refills | Status: AC

## 2023-12-01 MED ORDER — CLONIDINE HCL 0.1 MG PO TABS: 0.1000 mg | ORAL_TABLET | Freq: Two times a day (BID) | ORAL | 0 refills | Status: AC

## 2023-12-01 NOTE — Progress Notes (Signed)
Location:  [facility] Room Number: 148 Place of Service:  SNF (31)   CODE STATUS: full   Allergies  Allergen Reactions   Ace Inhibitors Cough   Pork-Derived Products     Family reports patient does not eat pork     Chief Complaint  Patient presents with   Acute Visit    Discharge    HPI:  She is being discharged to home with home health for pt/ot. Her family has provided a transport wheelchair. She will need her prescriptions written and will need to follow up with her pcp. She had been hospitalized for acute metabolic encephalopathy; uti. She was admitted to this facility for short term rehab: ambulate 30 feet with rolling walker and min assist. Upper body standby assist; lower body contact guard; stand pivot supervision; brp: contact guard.    Past Medical History:  Diagnosis Date   Arthritis    Hypertension     Past Surgical History:  Procedure Laterality Date   ABDOMINAL HYSTERECTOMY     JOINT REPLACEMENT     TOTAL KNEE ARTHROPLASTY      Social History   Socioeconomic History   Marital status: Divorced    Spouse name: Not on file   Number of children: Not on file   Years of education: Not on file   Highest education level: Not on file  Occupational History   Not on file  Tobacco Use   Smoking status: Never   Smokeless tobacco: Never  Vaping Use   Vaping status: Never Used  Substance and Sexual Activity   Alcohol use: No   Drug use: No   Sexual activity: Not Currently    Birth control/protection: Surgical  Other Topics Concern   Not on file  Social History Narrative   Not on file   Social Drivers of Health   Financial Resource Strain: Not on file  Food Insecurity: Not on file  Transportation Needs: Not on file  Physical Activity: Not on file  Stress: Not on file  Social Connections: Not on file  Intimate Partner Violence: Not on file   Family History  Problem Relation Age of Onset   CAD Mother    Arthritis Mother     CVA Father    Hypertension Father    Arthritis Brother       VITAL SIGNS BP 117/71   Pulse 65   Temp (!) 97.1 F (36.2 C)   Resp 18   Ht 5\' 4"  (1.626 m)   Wt 140 lb (63.5 kg)   SpO2 98%   BMI 24.03 kg/m   Outpatient Encounter Medications as of 12/01/2023  Medication Sig   amLODipine (NORVASC) 5 MG tablet Take 1 tablet (5 mg total) by mouth daily.   calcium-vitamin D (OSCAL WITH D) 500-200 MG-UNIT per tablet Take 1 tablet by mouth 2 (two) times daily.   citalopram (CELEXA) 20 MG tablet Take 1 tablet (20 mg total) by mouth daily.   cloNIDine (CATAPRES) 0.1 MG tablet Take 1 tablet (0.1 mg total) by mouth 2 (two) times daily.   fluticasone (FLONASE) 50 MCG/ACT nasal spray Place 2 sprays into both nostrils daily as needed for rhinitis or allergies.   furosemide (LASIX) 20 MG tablet Take 1 tablet (20 mg total) by mouth daily.   furosemide (LASIX) 40 MG tablet Take 40 mg by mouth daily.   insulin glargine-yfgn (SEMGLEE) 100 UNIT/ML Pen Inject 9 Units into the skin at bedtime.   meloxicam (MOBIC) 7.5  MG tablet Take 1 tablet (7.5 mg total) by mouth daily.   omeprazole (PRILOSEC) 20 MG capsule Take 20 mg by mouth daily.   polyethylene glycol (MIRALAX / GLYCOLAX) 17 g packet Take 17 g by mouth daily as needed for mild constipation.   potassium chloride SA (KLOR-CON M) 20 MEQ tablet Take 1 tablet (20 mEq total) by mouth daily. *Alternate taking one tablet daily and taking two tablets daily*   pravastatin (PRAVACHOL) 40 MG tablet Take 1 tablet (40 mg total) by mouth daily.   Protein (PROSOURCE PO) Take 30 mLs by mouth 3 (three) times daily.   No facility-administered encounter medications on file as of 12/01/2023.     SIGNIFICANT DIAGNOSTIC EXAMS  PREVIOUS   09-04-23: urine culture: klebsiella pneumoniae multidrug resistant  10-08-23: wbc 13.8; hgb 10.4; hct 31.9; mcv 93.5 plt 141; glucose 59; bun 23; creat 0.98; k+ 2.5; na++ 131; ca 8.3; gfr 56; protein 6.4 albumin 2.8; mag 1.2;  hgb A1c 6.4 blood culture: citrobacter freundii 10-09-23: wbc 22.9; hgb 9.8 hct 29.3 mcv 92.4 plt 130; glucose 94; bun 23; creat 0.97; k+ 3.9; na++ 132; ca 7.8; gfr 57; urine culture: no growth 10-14-23: wbc 9.1; hgb 11.0; hct 32.9; mcv 92.1 plt 199; glucose 103; bun 17; creat 0.72; k+ 3.8; na++ 136; ca 8.4; gfr >60 10-19-23: wbc 7.3; hgb 9.2; hct 29.8; mcv 97.1 plt 392; glucose 189; bun 27; creat 0.94; k+ 4.7; na++ 133; ca 8.6; gfr 59; protein 6.7 albumin 2.6 mag 1.8; vitamin D 76.23 11-02-23: glucose 74; bun 28; creat 0.80; k+ 3.6; na++ 137; ca 8.1; gfr >60; BNP 218  TODAY  11-19-23: urine culture: e-coli: cipro  Review of Systems  Constitutional:  Negative for malaise/fatigue.  Respiratory:  Negative for cough and shortness of breath.   Cardiovascular:  Negative for chest pain, palpitations and leg swelling.  Gastrointestinal:  Negative for abdominal pain, constipation and heartburn.  Musculoskeletal:  Negative for back pain, joint pain and myalgias.  Skin: Negative.   Neurological:  Negative for dizziness.  Psychiatric/Behavioral:  The patient is not nervous/anxious.    Physical Exam Constitutional:      General: She is not in acute distress.    Appearance: She is well-developed. She is not diaphoretic.  Neck:     Thyroid: No thyromegaly.  Cardiovascular:     Rate and Rhythm: Normal rate and regular rhythm.     Pulses: Normal pulses.     Heart sounds: Normal heart sounds.  Pulmonary:     Effort: Pulmonary effort is normal. No respiratory distress.     Breath sounds: Normal breath sounds.  Abdominal:     General: Bowel sounds are normal. There is no distension.     Palpations: Abdomen is soft.     Tenderness: There is no abdominal tenderness.  Musculoskeletal:        General: Normal range of motion.     Cervical back: Neck supple.     Right lower leg: Edema present.     Left lower leg: Edema present.  Lymphadenopathy:     Cervical: No cervical adenopathy.  Skin:     General: Skin is warm and dry.  Neurological:     Mental Status: She is alert. Mental status is at baseline.  Psychiatric:        Mood and Affect: Mood normal.      ASSESSMENT/ PLAN:  Patient is being discharged with the following home health services:  pt/ot to evaluate and treat as indicated for  gait balance strength adl training   Patient is being discharged with the following durable medical equipment:  has transport chair   Patient has been advised to f/u with their PCP in 1-2 weeks to for a transitions of care visit.  Social services at their facility was responsible for arranging this appointment.  Pt was provided with adequate prescriptions of noncontrolled medications to reach the scheduled appointment .  For controlled substances, a limited supply was provided as appropriate for the individual patient.  If the pt normally receives these medications from a pain clinic or has a contract with another physician, these medications should be received from that clinic or physician only).    A 30 day supply of her prescription medications have been sent to [facility]   Time spent with patient: 35 minutes: medications; home health; dme.    Synthia Innocent NP [facility]  call [phone]

## 2023-12-04 NOTE — Telephone Encounter (Signed)
Pharmacy comment: Alternative Requested:THE PRESCRIBED MEDICATION IS NOT COVERED BY INSURANCE. PLEASE CONSIDER CHANGING TO ONE OF THE SUGGESTED COVERED ALTERNATIVES.

## 2023-12-06 DIAGNOSIS — R2681 Unsteadiness on feet: Secondary | ICD-10-CM | POA: Diagnosis not present

## 2023-12-06 DIAGNOSIS — Z9181 History of falling: Secondary | ICD-10-CM | POA: Diagnosis not present

## 2023-12-06 DIAGNOSIS — R2689 Other abnormalities of gait and mobility: Secondary | ICD-10-CM | POA: Diagnosis not present

## 2023-12-06 DIAGNOSIS — R262 Difficulty in walking, not elsewhere classified: Secondary | ICD-10-CM | POA: Diagnosis not present

## 2023-12-06 DIAGNOSIS — M6281 Muscle weakness (generalized): Secondary | ICD-10-CM | POA: Diagnosis not present

## 2023-12-06 DIAGNOSIS — R531 Weakness: Secondary | ICD-10-CM | POA: Diagnosis not present

## 2023-12-06 DIAGNOSIS — R269 Unspecified abnormalities of gait and mobility: Secondary | ICD-10-CM | POA: Diagnosis not present

## 2023-12-07 DIAGNOSIS — A419 Sepsis, unspecified organism: Secondary | ICD-10-CM | POA: Diagnosis not present

## 2023-12-07 DIAGNOSIS — F0393 Unspecified dementia, unspecified severity, with mood disturbance: Secondary | ICD-10-CM | POA: Diagnosis not present

## 2023-12-07 DIAGNOSIS — E1122 Type 2 diabetes mellitus with diabetic chronic kidney disease: Secondary | ICD-10-CM | POA: Diagnosis not present

## 2023-12-07 DIAGNOSIS — R6889 Other general symptoms and signs: Secondary | ICD-10-CM | POA: Diagnosis not present

## 2023-12-07 DIAGNOSIS — E1151 Type 2 diabetes mellitus with diabetic peripheral angiopathy without gangrene: Secondary | ICD-10-CM | POA: Diagnosis not present

## 2023-12-07 DIAGNOSIS — I10 Essential (primary) hypertension: Secondary | ICD-10-CM | POA: Diagnosis not present

## 2023-12-07 DIAGNOSIS — D509 Iron deficiency anemia, unspecified: Secondary | ICD-10-CM | POA: Diagnosis not present

## 2023-12-07 DIAGNOSIS — E1129 Type 2 diabetes mellitus with other diabetic kidney complication: Secondary | ICD-10-CM | POA: Diagnosis not present

## 2023-12-07 DIAGNOSIS — R011 Cardiac murmur, unspecified: Secondary | ICD-10-CM | POA: Diagnosis not present

## 2023-12-07 DIAGNOSIS — N1831 Chronic kidney disease, stage 3a: Secondary | ICD-10-CM | POA: Diagnosis not present

## 2023-12-07 DIAGNOSIS — I4891 Unspecified atrial fibrillation: Secondary | ICD-10-CM | POA: Diagnosis not present

## 2023-12-07 DIAGNOSIS — N952 Postmenopausal atrophic vaginitis: Secondary | ICD-10-CM | POA: Diagnosis not present

## 2023-12-25 ENCOUNTER — Other Ambulatory Visit: Payer: Self-pay | Admitting: Adult Health

## 2023-12-29 ENCOUNTER — Other Ambulatory Visit: Payer: Self-pay | Admitting: Adult Health

## 2023-12-31 ENCOUNTER — Other Ambulatory Visit: Payer: Self-pay | Admitting: Adult Health

## 2024-01-03 NOTE — Progress Notes (Deleted)
 Name: Dana Ellis DOB: [birth year] MRN: [id number]  History of Present Illness: Ms. Eisenhart is a 89 y.o. female who presents today for follow up visit at [facility]. She is accompanied by ***, who assists with providing history due to patient's dementia. - GU history: 1. OAB with urge incontinence.  - Her neurogenic risk factors for OAB-type symptoms including T2DM and dementia. - Likely exacerbated by diuretic use and caffeine intake. 2. Stress urinary incontinence. Not significantly bothersome per patient.  3. Recurrent UTI. 4. Vaginal atrophy.   Urine culture results in past 12 months: - 01/06/2023: Negative - 01/18/2023: Negative  - 07/05/2023: Negative  - 08/18/2023: Positive for >100k E. Coli (relapsing) - 09/01/2023: Positive for Klebsiella pneumoniae - 10/09/2023: Negative  - 11/19/2023: Positive for E. Coli and Providencia rettgeri; treated with Cipro at SNF  At initial visit on 10/06/2023: > Asymptomatic bacteriuria. > PVR: 0 ml. > The plan was: 1. For recurrent UTI prevention: - Start topical vaginal estrogen cream. - Maintain adequate fluid intake daily. - Consider OTC supplements for UTI prevention. 2. For OAB with urge incontinence: - Minimize caffeine intake. - Timed voiding 3. Return in about 3 months (around 01/06/2024) for UA, PVR, & f/u with Evette Georges NP.  Since last visit: > 10/08/2023 - 10/16/2023: Admitted for sepsis with presumed UTI, however urine culture came back negative. Blood cultures positive. Discharged to SNF.  Today: She reports *** UTIs since last visit.  She {Actions; denies-reports:120008} increased urinary urgency, frequency, dysuria, gross hematuria, straining to void, or sensations of incomplete emptying.  She {Actions; denies-reports:120008} flank pain or abdominal pain. She {Actions; denies-reports:120008} fevers, nausea, or vomiting.  She {HAS HAS XBJ:47829} been using vaginal estrogen cream at a  frequency of *** time(s) per week. She {Actions; denies-reports:120008} vaginal pain, bleeding, discharge.   Fall Screening: Do you usually have a device to assist in your mobility? {yes/no:20286} ***cane / ***walker / ***wheelchair  Medications: Current Outpatient Medications  Medication Sig Dispense Refill   amLODipine (NORVASC) 5 MG tablet Take 1 tablet (5 mg total) by mouth daily. 30 tablet 0   calcium-vitamin D (OSCAL WITH D) 500-200 MG-UNIT per tablet Take 1 tablet by mouth 2 (two) times daily.     citalopram (CELEXA) 20 MG tablet Take 1 tablet (20 mg total) by mouth daily. 30 tablet 0   cloNIDine (CATAPRES) 0.1 MG tablet Take 1 tablet (0.1 mg total) by mouth 2 (two) times daily. 60 tablet 0   fluticasone (FLONASE) 50 MCG/ACT nasal spray Place 2 sprays into both nostrils daily as needed for rhinitis or allergies.     furosemide (LASIX) 20 MG tablet Take 1 tablet (20 mg total) by mouth daily. 30 tablet 0   furosemide (LASIX) 40 MG tablet Take 40 mg by mouth daily.     insulin glargine-yfgn (SEMGLEE) 100 UNIT/ML Pen Inject 9 Units into the skin at bedtime. 2.7 mL 0   meloxicam (MOBIC) 7.5 MG tablet Take 1 tablet (7.5 mg total) by mouth daily. 30 tablet 0   omeprazole (PRILOSEC) 20 MG capsule Take 20 mg by mouth daily.     polyethylene glycol (MIRALAX / GLYCOLAX) 17 g packet Take 17 g by mouth daily as needed for mild constipation.     potassium chloride SA (KLOR-CON M) 20 MEQ tablet Take 1 tablet (20 mEq total) by mouth daily. *Alternate taking one tablet daily and taking two tablets daily* 90 tablet 0   pravastatin (PRAVACHOL) 40 MG tablet Take 1 tablet (  40 mg total) by mouth daily. 30 tablet 0   Protein (PROSOURCE PO) Take 30 mLs by mouth 3 (three) times daily.     No current facility-administered medications for this visit.    Allergies: Allergies  Allergen Reactions   Ace Inhibitors Cough   Pork-Derived Products     Family reports patient does not eat pork     Past Medical  History:  Diagnosis Date   Arthritis    Hypertension    Past Surgical History:  Procedure Laterality Date   ABDOMINAL HYSTERECTOMY     JOINT REPLACEMENT     TOTAL KNEE ARTHROPLASTY     Family History  Problem Relation Age of Onset   CAD Mother    Arthritis Mother    CVA Father    Hypertension Father    Arthritis Brother    Social History   Socioeconomic History   Marital status: Divorced    Spouse name: Not on file   Number of children: Not on file   Years of education: Not on file   Highest education level: Not on file  Occupational History   Not on file  Tobacco Use   Smoking status: Never   Smokeless tobacco: Never  Vaping Use   Vaping status: Never Used  Substance and Sexual Activity   Alcohol use: No   Drug use: No   Sexual activity: Not Currently    Birth control/protection: Surgical  Other Topics Concern   Not on file  Social History Narrative   Not on file   Social Drivers of Health   Financial Resource Strain: Not on file  Food Insecurity: Not on file  Transportation Needs: Not on file  Physical Activity: Not on file  Stress: Not on file  Social Connections: Not on file  Intimate Partner Violence: Not on file    Review of Systems Constitutional: Patient denies any unintentional weight loss or change in strength lntegumentary: Patient denies any rashes or pruritus Cardiovascular: Patient denies chest pain or syncope Respiratory: Patient denies shortness of breath Gastrointestinal: Patient ***denies nausea, vomiting, constipation, or diarrhea Musculoskeletal: Patient denies muscle cramps or weakness Neurologic: Patient denies convulsions or seizures Allergic/Immunologic: Patient denies recent allergic reaction(s) Hematologic/Lymphatic: Patient denies bleeding tendencies Endocrine: Patient denies heat/cold intolerance  GU: As per HPI.  OBJECTIVE There were no vitals filed for this visit. There is no height or weight on file to calculate  BMI.  Physical Examination Constitutional: No obvious distress; patient is non-toxic appearing  Cardiovascular: No visible lower extremity edema.  Respiratory: The patient does not have audible wheezing/stridor; respirations do not appear labored  Gastrointestinal: Abdomen non-distended Musculoskeletal: Normal ROM of UEs  Skin: No obvious rashes/open sores  Neurologic: CN 2-12 grossly intact Psychiatric: Answered questions appropriately with normal affect  Hematologic/Lymphatic/Immunologic: No obvious bruises or sites of spontaneous bleeding  UA: ***negative / *** WBC/hpf, *** RBC/hpf, *** bacteria ***Urine microscopy: ***negative / *** WBC/hpf, *** RBC/hpf, *** bacteria ***with no evidence of UTI ***with no evidence of microscopic hematuria ***otherwise unremarkable  PVR: *** ml  ASSESSMENT No diagnosis found. ***  Will plan for follow up in *** months / ***1 year or sooner if needed. Pt verbalized understanding and agreement. All questions were answered.  PLAN Advised the following: 1. *** 2. ***No follow-ups on file.  No orders of the defined types were placed in this encounter.   It has been explained that the patient is to follow regularly with their PCP in addition to all other providers involved  in their care and to follow instructions provided by these respective offices. Patient advised to contact urology clinic if any urologic-pertaining questions, concerns, new symptoms or problems arise in the interim period.  There are no Patient Instructions on file for this visit.  Electronically signed by:  Donnita Falls, FNP   01/03/24    1:54 PM

## 2024-01-06 DIAGNOSIS — R531 Weakness: Secondary | ICD-10-CM | POA: Diagnosis not present

## 2024-01-06 DIAGNOSIS — R2689 Other abnormalities of gait and mobility: Secondary | ICD-10-CM | POA: Diagnosis not present

## 2024-01-06 DIAGNOSIS — Z9181 History of falling: Secondary | ICD-10-CM | POA: Diagnosis not present

## 2024-01-06 DIAGNOSIS — M6281 Muscle weakness (generalized): Secondary | ICD-10-CM | POA: Diagnosis not present

## 2024-01-06 DIAGNOSIS — R269 Unspecified abnormalities of gait and mobility: Secondary | ICD-10-CM | POA: Diagnosis not present

## 2024-01-06 DIAGNOSIS — R262 Difficulty in walking, not elsewhere classified: Secondary | ICD-10-CM | POA: Diagnosis not present

## 2024-01-06 DIAGNOSIS — R2681 Unsteadiness on feet: Secondary | ICD-10-CM | POA: Diagnosis not present

## 2024-01-11 ENCOUNTER — Ambulatory Visit: Payer: Medicare HMO | Admitting: Urology

## 2024-01-11 DIAGNOSIS — N952 Postmenopausal atrophic vaginitis: Secondary | ICD-10-CM

## 2024-01-11 DIAGNOSIS — N390 Urinary tract infection, site not specified: Secondary | ICD-10-CM

## 2024-01-11 DIAGNOSIS — N3946 Mixed incontinence: Secondary | ICD-10-CM

## 2024-01-12 DIAGNOSIS — J302 Other seasonal allergic rhinitis: Secondary | ICD-10-CM | POA: Diagnosis not present

## 2024-01-12 DIAGNOSIS — E1129 Type 2 diabetes mellitus with other diabetic kidney complication: Secondary | ICD-10-CM | POA: Diagnosis not present

## 2024-01-12 DIAGNOSIS — Z87440 Personal history of urinary (tract) infections: Secondary | ICD-10-CM | POA: Diagnosis not present

## 2024-01-12 DIAGNOSIS — E1122 Type 2 diabetes mellitus with diabetic chronic kidney disease: Secondary | ICD-10-CM | POA: Diagnosis not present

## 2024-01-12 DIAGNOSIS — E782 Mixed hyperlipidemia: Secondary | ICD-10-CM | POA: Diagnosis not present

## 2024-01-12 DIAGNOSIS — E1151 Type 2 diabetes mellitus with diabetic peripheral angiopathy without gangrene: Secondary | ICD-10-CM | POA: Diagnosis not present

## 2024-01-12 DIAGNOSIS — N1831 Chronic kidney disease, stage 3a: Secondary | ICD-10-CM | POA: Diagnosis not present

## 2024-01-12 DIAGNOSIS — E559 Vitamin D deficiency, unspecified: Secondary | ICD-10-CM | POA: Diagnosis not present

## 2024-01-12 DIAGNOSIS — I10 Essential (primary) hypertension: Secondary | ICD-10-CM | POA: Diagnosis not present

## 2024-01-12 DIAGNOSIS — I129 Hypertensive chronic kidney disease with stage 1 through stage 4 chronic kidney disease, or unspecified chronic kidney disease: Secondary | ICD-10-CM | POA: Diagnosis not present

## 2024-01-12 DIAGNOSIS — I700 Atherosclerosis of aorta: Secondary | ICD-10-CM | POA: Diagnosis not present

## 2024-01-12 DIAGNOSIS — D509 Iron deficiency anemia, unspecified: Secondary | ICD-10-CM | POA: Diagnosis not present

## 2024-01-12 DIAGNOSIS — F329 Major depressive disorder, single episode, unspecified: Secondary | ICD-10-CM | POA: Diagnosis not present

## 2024-01-18 ENCOUNTER — Ambulatory Visit: Payer: Medicare HMO | Admitting: Urology

## 2024-01-26 ENCOUNTER — Other Ambulatory Visit: Payer: Self-pay | Admitting: Adult Health

## 2024-02-05 NOTE — Progress Notes (Deleted)
 Name: Dana Ellis DOB: [birth year] MRN: [id number]  History of Present Illness: Dana Ellis is a 89 y.o. female who presents today for follow up visit at [facility]. ***She is accompanied by [name], who assisted with giving history due to patient's dementia.  - GU history: 1. Recurrent UTI. 2. Vaginal atrophy.  3. OAB with urge incontinence. 4. Stress urinary incontinence.  Urine culture results in past 12 months: - 07/05/2023: Negative  - 08/18/2023: Positive for >100k E. Coli (relapsing) - 09/01/2023: Positive for Klebsiella pneumoniae - 11/19/2023: Positive for E. Coli and Providencia rettgeri  At initial visit on 10/06/2023: > The plan was:  1. For recurrent UTI prevention: - Start topical vaginal estrogen cream. - Maintain adequate fluid intake daily. - Consider OTC supplements for UTI prevention. 2. For OAB with urge incontinence: - Minimize caffeine intake. - Timed voiding 3. Return in about 3 months (around 01/06/2024) for UA, PVR, & f/u with Evette Georges NP.   Since last visit: ***  Today: She reports *** UTIs since last visit. She {HAS HAS MVH:84696} been using vaginal estrogen cream at a frequency of *** time(s) per week.  She reports {Blank multiple:19197::"improved","persistent / unchanged"} urinary ***frequency, ***nocturia, ***urgency, and ***urge incontinence. Voiding ***x/day and ***x/night on average. Leaking ***x/day on average; using *** ***pads / ***diapers per day on average.  She {Actions; denies-reports:120008} dysuria, gross hematuria, straining to void, or sensations of incomplete emptying.   Fall Screening: Do you usually have a device to assist in your mobility? {yes/no:20286} ***cane / ***walker / ***wheelchair  Medications: Current Outpatient Medications  Medication Sig Dispense Refill   amLODipine (NORVASC) 5 MG tablet Take 1 tablet (5 mg total) by mouth daily. 30 tablet 0   calcium-vitamin D (OSCAL WITH D)  500-200 MG-UNIT per tablet Take 1 tablet by mouth 2 (two) times daily.     citalopram (CELEXA) 20 MG tablet Take 1 tablet (20 mg total) by mouth daily. 30 tablet 0   cloNIDine (CATAPRES) 0.1 MG tablet Take 1 tablet (0.1 mg total) by mouth 2 (two) times daily. 60 tablet 0   fluticasone (FLONASE) 50 MCG/ACT nasal spray Place 2 sprays into both nostrils daily as needed for rhinitis or allergies.     furosemide (LASIX) 20 MG tablet Take 1 tablet (20 mg total) by mouth daily. 30 tablet 0   furosemide (LASIX) 40 MG tablet Take 40 mg by mouth daily.     insulin glargine-yfgn (SEMGLEE) 100 UNIT/ML Pen Inject 9 Units into the skin at bedtime. 2.7 mL 0   meloxicam (MOBIC) 7.5 MG tablet Take 1 tablet (7.5 mg total) by mouth daily. 30 tablet 0   omeprazole (PRILOSEC) 20 MG capsule Take 20 mg by mouth daily.     polyethylene glycol (MIRALAX / GLYCOLAX) 17 g packet Take 17 g by mouth daily as needed for mild constipation.     potassium chloride SA (KLOR-CON M) 20 MEQ tablet Take 1 tablet (20 mEq total) by mouth daily. *Alternate taking one tablet daily and taking two tablets daily* 90 tablet 0   pravastatin (PRAVACHOL) 40 MG tablet Take 1 tablet (40 mg total) by mouth daily. 30 tablet 0   Protein (PROSOURCE PO) Take 30 mLs by mouth 3 (three) times daily.     No current facility-administered medications for this visit.    Allergies: Allergies  Allergen Reactions   Ace Inhibitors Cough   Pork-Derived Products     Family reports patient does not eat pork  Past Medical History:  Diagnosis Date   Arthritis    Hypertension    Past Surgical History:  Procedure Laterality Date   ABDOMINAL HYSTERECTOMY     JOINT REPLACEMENT     TOTAL KNEE ARTHROPLASTY     Family History  Problem Relation Age of Onset   CAD Mother    Arthritis Mother    CVA Father    Hypertension Father    Arthritis Brother    Social History   Socioeconomic History   Marital status: Divorced    Spouse name: Not on file    Number of children: Not on file   Years of education: Not on file   Highest education level: Not on file  Occupational History   Not on file  Tobacco Use   Smoking status: Never   Smokeless tobacco: Never  Vaping Use   Vaping status: Never Used  Substance and Sexual Activity   Alcohol use: No   Drug use: No   Sexual activity: Not Currently    Birth control/protection: Surgical  Other Topics Concern   Not on file  Social History Narrative   Not on file   Social Drivers of Health   Financial Resource Strain: Not on file  Food Insecurity: Not on file  Transportation Needs: Not on file  Physical Activity: Not on file  Stress: Not on file  Social Connections: Not on file  Intimate Partner Violence: Not on file    Review of Systems Constitutional: Patient denies any unintentional weight loss or change in strength lntegumentary: Patient denies any rashes or pruritus Cardiovascular: Patient denies chest pain or syncope Respiratory: Patient denies shortness of breath Gastrointestinal: Patient ***denies nausea, vomiting, constipation, or diarrhea Musculoskeletal: Patient denies muscle cramps or weakness Neurologic: Patient denies convulsions or seizures Allergic/Immunologic: Patient denies recent allergic reaction(s) Hematologic/Lymphatic: Patient denies bleeding tendencies Endocrine: Patient denies heat/cold intolerance  GU: As per HPI.  OBJECTIVE There were no vitals filed for this visit. There is no height or weight on file to calculate BMI.  Physical Examination Constitutional: No obvious distress; patient is non-toxic appearing  Cardiovascular: No visible lower extremity edema.  Respiratory: The patient does not have audible wheezing/stridor; respirations do not appear labored  Gastrointestinal: Abdomen non-distended Musculoskeletal: Normal ROM of UEs  Skin: No obvious rashes/open sores  Neurologic: CN 2-12 grossly intact Psychiatric: Answered questions  appropriately with normal affect  Hematologic/Lymphatic/Immunologic: No obvious bruises or sites of spontaneous bleeding  UA:  ***positive for *** leukocytes, *** blood, ***nitrites ***Urine microscopy:  ***negative  *** WBC/hpf, *** RBC/hpf, *** bacteria ***with no evidence of UTI ***with no evidence of microscopic hematuria ***otherwise unremarkable ***glucosuria (secondary to ***Jardiance ***Farxiga use)  PVR: *** ml  ASSESSMENT No diagnosis found. ***  We agreed to plan for follow up in *** months or sooner if needed. Patient verbalized understanding of and agreement with current plan. All questions were answered.  PLAN Advised the following: 1. *** 2. ***No follow-ups on file.  No orders of the defined types were placed in this encounter.   It has been explained that the patient is to follow regularly with their PCP in addition to all other providers involved in their care and to follow instructions provided by these respective offices. Patient advised to contact urology clinic if any urologic-pertaining questions, concerns, new symptoms or problems arise in the interim period.  There are no Patient Instructions on file for this visit.  Electronically signed by:  Donnita Falls, FNP   02/05/24  1:42 PM

## 2024-02-06 DIAGNOSIS — R2681 Unsteadiness on feet: Secondary | ICD-10-CM | POA: Diagnosis not present

## 2024-02-06 DIAGNOSIS — R262 Difficulty in walking, not elsewhere classified: Secondary | ICD-10-CM | POA: Diagnosis not present

## 2024-02-06 DIAGNOSIS — R2689 Other abnormalities of gait and mobility: Secondary | ICD-10-CM | POA: Diagnosis not present

## 2024-02-06 DIAGNOSIS — R531 Weakness: Secondary | ICD-10-CM | POA: Diagnosis not present

## 2024-02-06 DIAGNOSIS — R269 Unspecified abnormalities of gait and mobility: Secondary | ICD-10-CM | POA: Diagnosis not present

## 2024-02-06 DIAGNOSIS — M6281 Muscle weakness (generalized): Secondary | ICD-10-CM | POA: Diagnosis not present

## 2024-02-06 DIAGNOSIS — Z9181 History of falling: Secondary | ICD-10-CM | POA: Diagnosis not present

## 2024-02-09 ENCOUNTER — Ambulatory Visit: Payer: Medicare HMO | Admitting: Urology

## 2024-02-09 DIAGNOSIS — N952 Postmenopausal atrophic vaginitis: Secondary | ICD-10-CM

## 2024-02-09 DIAGNOSIS — N390 Urinary tract infection, site not specified: Secondary | ICD-10-CM

## 2024-02-09 DIAGNOSIS — N3946 Mixed incontinence: Secondary | ICD-10-CM

## 2024-02-22 DIAGNOSIS — D509 Iron deficiency anemia, unspecified: Secondary | ICD-10-CM | POA: Diagnosis not present

## 2024-02-22 DIAGNOSIS — L989 Disorder of the skin and subcutaneous tissue, unspecified: Secondary | ICD-10-CM | POA: Diagnosis not present

## 2024-02-22 DIAGNOSIS — K219 Gastro-esophageal reflux disease without esophagitis: Secondary | ICD-10-CM | POA: Diagnosis not present

## 2024-02-22 DIAGNOSIS — I10 Essential (primary) hypertension: Secondary | ICD-10-CM | POA: Diagnosis not present

## 2024-02-22 DIAGNOSIS — E559 Vitamin D deficiency, unspecified: Secondary | ICD-10-CM | POA: Diagnosis not present

## 2024-02-22 DIAGNOSIS — E1129 Type 2 diabetes mellitus with other diabetic kidney complication: Secondary | ICD-10-CM | POA: Diagnosis not present

## 2024-02-22 DIAGNOSIS — R809 Proteinuria, unspecified: Secondary | ICD-10-CM | POA: Diagnosis not present

## 2024-02-22 DIAGNOSIS — E1165 Type 2 diabetes mellitus with hyperglycemia: Secondary | ICD-10-CM | POA: Diagnosis not present

## 2024-02-22 DIAGNOSIS — E876 Hypokalemia: Secondary | ICD-10-CM | POA: Diagnosis not present

## 2024-02-22 DIAGNOSIS — E1122 Type 2 diabetes mellitus with diabetic chronic kidney disease: Secondary | ICD-10-CM | POA: Diagnosis not present

## 2024-02-22 DIAGNOSIS — N1831 Chronic kidney disease, stage 3a: Secondary | ICD-10-CM | POA: Diagnosis not present

## 2024-02-22 DIAGNOSIS — K8689 Other specified diseases of pancreas: Secondary | ICD-10-CM | POA: Diagnosis not present

## 2024-02-25 ENCOUNTER — Other Ambulatory Visit: Payer: Self-pay | Admitting: Adult Health

## 2024-03-05 DIAGNOSIS — R2681 Unsteadiness on feet: Secondary | ICD-10-CM | POA: Diagnosis not present

## 2024-03-05 DIAGNOSIS — Z9181 History of falling: Secondary | ICD-10-CM | POA: Diagnosis not present

## 2024-03-05 DIAGNOSIS — R531 Weakness: Secondary | ICD-10-CM | POA: Diagnosis not present

## 2024-03-05 DIAGNOSIS — R269 Unspecified abnormalities of gait and mobility: Secondary | ICD-10-CM | POA: Diagnosis not present

## 2024-03-05 DIAGNOSIS — M6281 Muscle weakness (generalized): Secondary | ICD-10-CM | POA: Diagnosis not present

## 2024-03-05 DIAGNOSIS — R262 Difficulty in walking, not elsewhere classified: Secondary | ICD-10-CM | POA: Diagnosis not present

## 2024-03-05 DIAGNOSIS — R2689 Other abnormalities of gait and mobility: Secondary | ICD-10-CM | POA: Diagnosis not present

## 2024-03-07 DIAGNOSIS — N3281 Overactive bladder: Secondary | ICD-10-CM | POA: Insufficient documentation

## 2024-03-07 NOTE — Progress Notes (Deleted)
 Name: Dana Ellis DOB: 06/26/1934 MRN: [id number]  History of Present Illness: Dana Ellis is a 89 y.o. female who presents today for follow up visit at [facility]. ***She is accompanied by her son Dana Ellis, who assisted with giving history due to patient's dementia.  - GU history: 1. Recurrent UTI. 2. Vaginal atrophy.  3. OAB with urge incontinence. 4. Stress urinary incontinence.  Urine culture results in past 12 months: - 07/05/2023: Negative  - 08/18/2023: Positive for >100k E. Coli (relapsing) - 09/01/2023: Positive for Klebsiella pneumoniae - 11/19/2023: Positive for E. Coli and Providencia rettgeri  At initial visit on 10/06/2023: > The plan was:  1. For recurrent UTI prevention: - Start topical vaginal estrogen cream. - Maintain adequate fluid intake daily. - Consider OTC supplements for UTI prevention. 2. For OAB with urge incontinence: - Minimize caffeine intake. - Timed voiding 3. Return in about 3 months (around 01/06/2024) for UA, PVR, & f/u with Dana Georges NP.   Since last visit: ***  Today: She reports *** UTIs since last visit. She {HAS HAS EXB:28413} been using vaginal estrogen cream at a frequency of *** time(s) per week.  She reports {Blank multiple:19197::"improved","persistent / unchanged"} urinary ***frequency, ***nocturia, ***urgency, and ***urge incontinence. Voiding ***x/day and ***x/night on average. Leaking ***x/day on average; using *** ***pads / ***diapers per day on average.  She {Actions; denies-reports:120008} dysuria, gross hematuria, straining to void, or sensations of incomplete emptying.   Fall Screening: Do you usually have a device to assist in your mobility? {yes/no:20286} ***cane / ***walker / ***wheelchair  Medications: Current Outpatient Medications  Medication Sig Dispense Refill   amLODipine (NORVASC) 5 MG tablet Take 1 tablet (5 mg total) by mouth daily. 30 tablet 0   calcium-vitamin D (OSCAL WITH D)  500-200 MG-UNIT per tablet Take 1 tablet by mouth 2 (two) times daily.     citalopram (CELEXA) 20 MG tablet Take 1 tablet (20 mg total) by mouth daily. 30 tablet 0   cloNIDine (CATAPRES) 0.1 MG tablet Take 1 tablet (0.1 mg total) by mouth 2 (two) times daily. 60 tablet 0   fluticasone (FLONASE) 50 MCG/ACT nasal spray Place 2 sprays into both nostrils daily as needed for rhinitis or allergies.     furosemide (LASIX) 20 MG tablet Take 1 tablet (20 mg total) by mouth daily. 30 tablet 0   furosemide (LASIX) 40 MG tablet Take 40 mg by mouth daily.     insulin glargine-yfgn (SEMGLEE) 100 UNIT/ML Pen Inject 9 Units into the skin at bedtime. 2.7 mL 0   meloxicam (MOBIC) 7.5 MG tablet Take 1 tablet (7.5 mg total) by mouth daily. 30 tablet 0   omeprazole (PRILOSEC) 20 MG capsule Take 20 mg by mouth daily.     polyethylene glycol (MIRALAX / GLYCOLAX) 17 g packet Take 17 g by mouth daily as needed for mild constipation.     potassium chloride SA (KLOR-CON M) 20 MEQ tablet Take 1 tablet (20 mEq total) by mouth daily. *Alternate taking one tablet daily and taking two tablets daily* 90 tablet 0   pravastatin (PRAVACHOL) 40 MG tablet Take 1 tablet (40 mg total) by mouth daily. 30 tablet 0   Protein (PROSOURCE PO) Take 30 mLs by mouth 3 (three) times daily.     No current facility-administered medications for this visit.    Allergies: Allergies  Allergen Reactions   Ace Inhibitors Cough   Pork-Derived Products     Family reports patient does not eat pork  Past Medical History:  Diagnosis Date   Arthritis    Hypertension    Past Surgical History:  Procedure Laterality Date   ABDOMINAL HYSTERECTOMY     JOINT REPLACEMENT     TOTAL KNEE ARTHROPLASTY     Family History  Problem Relation Age of Onset   CAD Mother    Arthritis Mother    CVA Father    Hypertension Father    Arthritis Brother    Social History   Socioeconomic History   Marital status: Divorced    Spouse name: Not on file    Number of children: Not on file   Years of education: Not on file   Highest education level: Not on file  Occupational History   Not on file  Tobacco Use   Smoking status: Never   Smokeless tobacco: Never  Vaping Use   Vaping status: Never Used  Substance and Sexual Activity   Alcohol use: No   Drug use: No   Sexual activity: Not Currently    Birth control/protection: Surgical  Other Topics Concern   Not on file  Social History Narrative   Not on file   Social Drivers of Health   Financial Resource Strain: Not on file  Food Insecurity: Not on file  Transportation Needs: Not on file  Physical Activity: Not on file  Stress: Not on file  Social Connections: Not on file  Intimate Partner Violence: Not on file    Review of Systems Constitutional: Patient denies any unintentional weight loss or change in strength lntegumentary: Patient denies any rashes or pruritus Cardiovascular: Patient denies chest pain or syncope Respiratory: Patient denies shortness of breath Gastrointestinal: Patient ***denies nausea, vomiting, constipation, or diarrhea Musculoskeletal: Patient denies muscle cramps or weakness Neurologic: Patient denies convulsions or seizures Allergic/Immunologic: Patient denies recent allergic reaction(s) Hematologic/Lymphatic: Patient denies bleeding tendencies Endocrine: Patient denies heat/cold intolerance  GU: As per HPI.  OBJECTIVE There were no vitals filed for this visit. There is no height or weight on file to calculate BMI.  Physical Examination Constitutional: No obvious distress; patient is non-toxic appearing  Cardiovascular: No visible lower extremity edema.  Respiratory: The patient does not have audible wheezing/stridor; respirations do not appear labored  Gastrointestinal: Abdomen non-distended Musculoskeletal: Normal ROM of UEs  Skin: No obvious rashes/open sores  Neurologic: CN 2-12 grossly intact Psychiatric: Answered questions  appropriately with normal affect  Hematologic/Lymphatic/Immunologic: No obvious bruises or sites of spontaneous bleeding  UA:  ***positive for *** leukocytes, *** blood, ***nitrites ***Urine microscopy:  ***negative  *** WBC/hpf, *** RBC/hpf, *** bacteria ***with no evidence of UTI ***with no evidence of microscopic hematuria ***otherwise unremarkable ***glucosuria (secondary to ***Jardiance ***Farxiga use)  PVR: *** ml  ASSESSMENT No diagnosis found. ***  We agreed to plan for follow up in *** months or sooner if needed. Patient verbalized understanding of and agreement with current plan. All questions were answered.  PLAN Advised the following: 1. *** 2. ***No follow-ups on file.  No orders of the defined types were placed in this encounter.   It has been explained that the patient is to follow regularly with their PCP in addition to all other providers involved in their care and to follow instructions provided by these respective offices. Patient advised to contact urology clinic if any urologic-pertaining questions, concerns, new symptoms or problems arise in the interim period.  There are no Patient Instructions on file for this visit.  Electronically signed by:  Donnita Falls, FNP   03/07/24  12:41 PM

## 2024-03-14 ENCOUNTER — Ambulatory Visit: Payer: Medicare HMO | Admitting: Urology

## 2024-03-14 DIAGNOSIS — N3281 Overactive bladder: Secondary | ICD-10-CM

## 2024-03-14 DIAGNOSIS — N390 Urinary tract infection, site not specified: Secondary | ICD-10-CM

## 2024-03-14 DIAGNOSIS — N3946 Mixed incontinence: Secondary | ICD-10-CM

## 2024-03-14 DIAGNOSIS — N952 Postmenopausal atrophic vaginitis: Secondary | ICD-10-CM

## 2024-03-19 DIAGNOSIS — Z713 Dietary counseling and surveillance: Secondary | ICD-10-CM | POA: Diagnosis not present

## 2024-03-19 DIAGNOSIS — I10 Essential (primary) hypertension: Secondary | ICD-10-CM | POA: Diagnosis not present

## 2024-03-19 DIAGNOSIS — Z79899 Other long term (current) drug therapy: Secondary | ICD-10-CM | POA: Diagnosis not present

## 2024-03-19 DIAGNOSIS — H9193 Unspecified hearing loss, bilateral: Secondary | ICD-10-CM | POA: Diagnosis not present

## 2024-03-19 DIAGNOSIS — J302 Other seasonal allergic rhinitis: Secondary | ICD-10-CM | POA: Diagnosis not present

## 2024-03-19 DIAGNOSIS — L989 Disorder of the skin and subcutaneous tissue, unspecified: Secondary | ICD-10-CM | POA: Diagnosis not present

## 2024-03-19 DIAGNOSIS — K8689 Other specified diseases of pancreas: Secondary | ICD-10-CM | POA: Diagnosis not present

## 2024-03-19 DIAGNOSIS — R42 Dizziness and giddiness: Secondary | ICD-10-CM | POA: Diagnosis not present

## 2024-04-05 DIAGNOSIS — M6281 Muscle weakness (generalized): Secondary | ICD-10-CM | POA: Diagnosis not present

## 2024-04-05 DIAGNOSIS — R269 Unspecified abnormalities of gait and mobility: Secondary | ICD-10-CM | POA: Diagnosis not present

## 2024-04-05 DIAGNOSIS — R531 Weakness: Secondary | ICD-10-CM | POA: Diagnosis not present

## 2024-04-05 DIAGNOSIS — R262 Difficulty in walking, not elsewhere classified: Secondary | ICD-10-CM | POA: Diagnosis not present

## 2024-04-05 DIAGNOSIS — R2689 Other abnormalities of gait and mobility: Secondary | ICD-10-CM | POA: Diagnosis not present

## 2024-04-05 DIAGNOSIS — R2681 Unsteadiness on feet: Secondary | ICD-10-CM | POA: Diagnosis not present

## 2024-04-05 DIAGNOSIS — Z9181 History of falling: Secondary | ICD-10-CM | POA: Diagnosis not present

## 2024-04-18 DIAGNOSIS — M79675 Pain in left toe(s): Secondary | ICD-10-CM | POA: Diagnosis not present

## 2024-04-18 DIAGNOSIS — M2012 Hallux valgus (acquired), left foot: Secondary | ICD-10-CM | POA: Diagnosis not present

## 2024-04-18 DIAGNOSIS — M79671 Pain in right foot: Secondary | ICD-10-CM | POA: Diagnosis not present

## 2024-04-18 DIAGNOSIS — E1140 Type 2 diabetes mellitus with diabetic neuropathy, unspecified: Secondary | ICD-10-CM | POA: Diagnosis not present

## 2024-04-18 DIAGNOSIS — I739 Peripheral vascular disease, unspecified: Secondary | ICD-10-CM | POA: Diagnosis not present

## 2024-04-18 DIAGNOSIS — M79674 Pain in right toe(s): Secondary | ICD-10-CM | POA: Diagnosis not present

## 2024-04-18 DIAGNOSIS — M79672 Pain in left foot: Secondary | ICD-10-CM | POA: Diagnosis not present

## 2024-04-18 DIAGNOSIS — L110 Acquired keratosis follicularis: Secondary | ICD-10-CM | POA: Diagnosis not present

## 2024-04-19 DIAGNOSIS — I129 Hypertensive chronic kidney disease with stage 1 through stage 4 chronic kidney disease, or unspecified chronic kidney disease: Secondary | ICD-10-CM | POA: Diagnosis not present

## 2024-04-19 DIAGNOSIS — K8689 Other specified diseases of pancreas: Secondary | ICD-10-CM | POA: Diagnosis not present

## 2024-04-19 DIAGNOSIS — D509 Iron deficiency anemia, unspecified: Secondary | ICD-10-CM | POA: Diagnosis not present

## 2024-04-19 DIAGNOSIS — K219 Gastro-esophageal reflux disease without esophagitis: Secondary | ICD-10-CM | POA: Diagnosis not present

## 2024-04-19 DIAGNOSIS — E559 Vitamin D deficiency, unspecified: Secondary | ICD-10-CM | POA: Diagnosis not present

## 2024-04-19 DIAGNOSIS — I10 Essential (primary) hypertension: Secondary | ICD-10-CM | POA: Diagnosis not present

## 2024-04-19 DIAGNOSIS — L989 Disorder of the skin and subcutaneous tissue, unspecified: Secondary | ICD-10-CM | POA: Diagnosis not present

## 2024-04-19 DIAGNOSIS — R809 Proteinuria, unspecified: Secondary | ICD-10-CM | POA: Diagnosis not present

## 2024-04-19 DIAGNOSIS — H9193 Unspecified hearing loss, bilateral: Secondary | ICD-10-CM | POA: Diagnosis not present

## 2024-04-19 DIAGNOSIS — E1122 Type 2 diabetes mellitus with diabetic chronic kidney disease: Secondary | ICD-10-CM | POA: Diagnosis not present

## 2024-04-19 DIAGNOSIS — J302 Other seasonal allergic rhinitis: Secondary | ICD-10-CM | POA: Diagnosis not present

## 2024-04-19 DIAGNOSIS — E876 Hypokalemia: Secondary | ICD-10-CM | POA: Diagnosis not present

## 2024-05-02 DIAGNOSIS — I739 Peripheral vascular disease, unspecified: Secondary | ICD-10-CM | POA: Diagnosis not present

## 2024-05-02 DIAGNOSIS — M79675 Pain in left toe(s): Secondary | ICD-10-CM | POA: Diagnosis not present

## 2024-05-02 DIAGNOSIS — M79674 Pain in right toe(s): Secondary | ICD-10-CM | POA: Diagnosis not present

## 2024-05-02 DIAGNOSIS — L110 Acquired keratosis follicularis: Secondary | ICD-10-CM | POA: Diagnosis not present

## 2024-05-02 DIAGNOSIS — E1140 Type 2 diabetes mellitus with diabetic neuropathy, unspecified: Secondary | ICD-10-CM | POA: Diagnosis not present

## 2024-05-02 DIAGNOSIS — M2012 Hallux valgus (acquired), left foot: Secondary | ICD-10-CM | POA: Diagnosis not present

## 2024-05-02 DIAGNOSIS — M79671 Pain in right foot: Secondary | ICD-10-CM | POA: Diagnosis not present

## 2024-05-02 DIAGNOSIS — M79672 Pain in left foot: Secondary | ICD-10-CM | POA: Diagnosis not present

## 2024-05-05 DIAGNOSIS — Z9181 History of falling: Secondary | ICD-10-CM | POA: Diagnosis not present

## 2024-05-05 DIAGNOSIS — R262 Difficulty in walking, not elsewhere classified: Secondary | ICD-10-CM | POA: Diagnosis not present

## 2024-05-05 DIAGNOSIS — R531 Weakness: Secondary | ICD-10-CM | POA: Diagnosis not present

## 2024-05-05 DIAGNOSIS — R269 Unspecified abnormalities of gait and mobility: Secondary | ICD-10-CM | POA: Diagnosis not present

## 2024-05-05 DIAGNOSIS — R2689 Other abnormalities of gait and mobility: Secondary | ICD-10-CM | POA: Diagnosis not present

## 2024-05-05 DIAGNOSIS — M6281 Muscle weakness (generalized): Secondary | ICD-10-CM | POA: Diagnosis not present

## 2024-05-05 DIAGNOSIS — R2681 Unsteadiness on feet: Secondary | ICD-10-CM | POA: Diagnosis not present

## 2024-05-09 DIAGNOSIS — I739 Peripheral vascular disease, unspecified: Secondary | ICD-10-CM | POA: Diagnosis not present

## 2024-05-09 DIAGNOSIS — N184 Chronic kidney disease, stage 4 (severe): Secondary | ICD-10-CM | POA: Diagnosis not present

## 2024-05-09 DIAGNOSIS — K219 Gastro-esophageal reflux disease without esophagitis: Secondary | ICD-10-CM | POA: Diagnosis not present

## 2024-05-09 DIAGNOSIS — E782 Mixed hyperlipidemia: Secondary | ICD-10-CM | POA: Diagnosis not present

## 2024-05-09 DIAGNOSIS — R42 Dizziness and giddiness: Secondary | ICD-10-CM | POA: Diagnosis not present

## 2024-05-09 DIAGNOSIS — N390 Urinary tract infection, site not specified: Secondary | ICD-10-CM | POA: Diagnosis not present

## 2024-05-09 DIAGNOSIS — E1165 Type 2 diabetes mellitus with hyperglycemia: Secondary | ICD-10-CM | POA: Diagnosis not present

## 2024-05-09 DIAGNOSIS — I10 Essential (primary) hypertension: Secondary | ICD-10-CM | POA: Diagnosis not present

## 2024-05-09 DIAGNOSIS — F329 Major depressive disorder, single episode, unspecified: Secondary | ICD-10-CM | POA: Diagnosis not present

## 2024-05-09 DIAGNOSIS — J302 Other seasonal allergic rhinitis: Secondary | ICD-10-CM | POA: Diagnosis not present

## 2024-05-09 DIAGNOSIS — K8689 Other specified diseases of pancreas: Secondary | ICD-10-CM | POA: Diagnosis not present

## 2024-05-09 DIAGNOSIS — D509 Iron deficiency anemia, unspecified: Secondary | ICD-10-CM | POA: Diagnosis not present

## 2024-05-17 DIAGNOSIS — E1165 Type 2 diabetes mellitus with hyperglycemia: Secondary | ICD-10-CM | POA: Diagnosis not present

## 2024-05-17 DIAGNOSIS — I10 Essential (primary) hypertension: Secondary | ICD-10-CM | POA: Diagnosis not present

## 2024-05-17 DIAGNOSIS — F329 Major depressive disorder, single episode, unspecified: Secondary | ICD-10-CM | POA: Diagnosis not present

## 2024-05-17 DIAGNOSIS — N184 Chronic kidney disease, stage 4 (severe): Secondary | ICD-10-CM | POA: Diagnosis not present

## 2024-05-24 DIAGNOSIS — H9193 Unspecified hearing loss, bilateral: Secondary | ICD-10-CM | POA: Diagnosis not present

## 2024-05-24 DIAGNOSIS — D509 Iron deficiency anemia, unspecified: Secondary | ICD-10-CM | POA: Diagnosis not present

## 2024-05-24 DIAGNOSIS — N390 Urinary tract infection, site not specified: Secondary | ICD-10-CM | POA: Diagnosis not present

## 2024-05-24 DIAGNOSIS — E782 Mixed hyperlipidemia: Secondary | ICD-10-CM | POA: Diagnosis not present

## 2024-05-24 DIAGNOSIS — K219 Gastro-esophageal reflux disease without esophagitis: Secondary | ICD-10-CM | POA: Diagnosis not present

## 2024-05-24 DIAGNOSIS — I10 Essential (primary) hypertension: Secondary | ICD-10-CM | POA: Diagnosis not present

## 2024-05-24 DIAGNOSIS — R42 Dizziness and giddiness: Secondary | ICD-10-CM | POA: Diagnosis not present

## 2024-05-24 DIAGNOSIS — K8689 Other specified diseases of pancreas: Secondary | ICD-10-CM | POA: Diagnosis not present

## 2024-05-24 DIAGNOSIS — E1165 Type 2 diabetes mellitus with hyperglycemia: Secondary | ICD-10-CM | POA: Diagnosis not present

## 2024-05-24 DIAGNOSIS — F329 Major depressive disorder, single episode, unspecified: Secondary | ICD-10-CM | POA: Diagnosis not present

## 2024-05-24 DIAGNOSIS — J302 Other seasonal allergic rhinitis: Secondary | ICD-10-CM | POA: Diagnosis not present

## 2024-05-24 DIAGNOSIS — I739 Peripheral vascular disease, unspecified: Secondary | ICD-10-CM | POA: Diagnosis not present

## 2024-05-31 DIAGNOSIS — K8689 Other specified diseases of pancreas: Secondary | ICD-10-CM | POA: Diagnosis not present

## 2024-05-31 DIAGNOSIS — E1165 Type 2 diabetes mellitus with hyperglycemia: Secondary | ICD-10-CM | POA: Diagnosis not present

## 2024-05-31 DIAGNOSIS — H9193 Unspecified hearing loss, bilateral: Secondary | ICD-10-CM | POA: Diagnosis not present

## 2024-05-31 DIAGNOSIS — N390 Urinary tract infection, site not specified: Secondary | ICD-10-CM | POA: Diagnosis not present

## 2024-05-31 DIAGNOSIS — D509 Iron deficiency anemia, unspecified: Secondary | ICD-10-CM | POA: Diagnosis not present

## 2024-05-31 DIAGNOSIS — F329 Major depressive disorder, single episode, unspecified: Secondary | ICD-10-CM | POA: Diagnosis not present

## 2024-05-31 DIAGNOSIS — E782 Mixed hyperlipidemia: Secondary | ICD-10-CM | POA: Diagnosis not present

## 2024-05-31 DIAGNOSIS — R42 Dizziness and giddiness: Secondary | ICD-10-CM | POA: Diagnosis not present

## 2024-05-31 DIAGNOSIS — I739 Peripheral vascular disease, unspecified: Secondary | ICD-10-CM | POA: Diagnosis not present

## 2024-05-31 DIAGNOSIS — J302 Other seasonal allergic rhinitis: Secondary | ICD-10-CM | POA: Diagnosis not present

## 2024-05-31 DIAGNOSIS — I10 Essential (primary) hypertension: Secondary | ICD-10-CM | POA: Diagnosis not present

## 2024-05-31 DIAGNOSIS — K219 Gastro-esophageal reflux disease without esophagitis: Secondary | ICD-10-CM | POA: Diagnosis not present

## 2024-06-05 DIAGNOSIS — R262 Difficulty in walking, not elsewhere classified: Secondary | ICD-10-CM | POA: Diagnosis not present

## 2024-06-05 DIAGNOSIS — M6281 Muscle weakness (generalized): Secondary | ICD-10-CM | POA: Diagnosis not present

## 2024-06-05 DIAGNOSIS — R531 Weakness: Secondary | ICD-10-CM | POA: Diagnosis not present

## 2024-06-05 DIAGNOSIS — R2681 Unsteadiness on feet: Secondary | ICD-10-CM | POA: Diagnosis not present

## 2024-06-05 DIAGNOSIS — R2689 Other abnormalities of gait and mobility: Secondary | ICD-10-CM | POA: Diagnosis not present

## 2024-06-05 DIAGNOSIS — Z9181 History of falling: Secondary | ICD-10-CM | POA: Diagnosis not present

## 2024-06-05 DIAGNOSIS — R269 Unspecified abnormalities of gait and mobility: Secondary | ICD-10-CM | POA: Diagnosis not present

## 2024-06-07 ENCOUNTER — Ambulatory Visit (INDEPENDENT_AMBULATORY_CARE_PROVIDER_SITE_OTHER): Admitting: Audiology

## 2024-06-07 ENCOUNTER — Institutional Professional Consult (permissible substitution) (INDEPENDENT_AMBULATORY_CARE_PROVIDER_SITE_OTHER): Admitting: Otolaryngology

## 2024-06-17 DIAGNOSIS — I129 Hypertensive chronic kidney disease with stage 1 through stage 4 chronic kidney disease, or unspecified chronic kidney disease: Secondary | ICD-10-CM | POA: Diagnosis not present

## 2024-06-17 DIAGNOSIS — E782 Mixed hyperlipidemia: Secondary | ICD-10-CM | POA: Diagnosis not present

## 2024-06-17 DIAGNOSIS — E1165 Type 2 diabetes mellitus with hyperglycemia: Secondary | ICD-10-CM | POA: Diagnosis not present

## 2024-06-17 DIAGNOSIS — I10 Essential (primary) hypertension: Secondary | ICD-10-CM | POA: Diagnosis not present

## 2024-06-20 ENCOUNTER — Other Ambulatory Visit (HOSPITAL_COMMUNITY): Payer: Self-pay

## 2024-06-20 ENCOUNTER — Ambulatory Visit (HOSPITAL_COMMUNITY)
Admission: RE | Admit: 2024-06-20 | Discharge: 2024-06-20 | Disposition: A | Discharge status: Home / Self Care | Source: Ambulatory Visit

## 2024-06-20 DIAGNOSIS — K449 Diaphragmatic hernia without obstruction or gangrene: Secondary | ICD-10-CM | POA: Diagnosis not present

## 2024-06-20 DIAGNOSIS — K8689 Other specified diseases of pancreas: Secondary | ICD-10-CM | POA: Diagnosis not present

## 2024-06-20 DIAGNOSIS — R17 Unspecified jaundice: Secondary | ICD-10-CM | POA: Insufficient documentation

## 2024-06-20 DIAGNOSIS — K5730 Diverticulosis of large intestine without perforation or abscess without bleeding: Secondary | ICD-10-CM | POA: Diagnosis not present

## 2024-06-20 DIAGNOSIS — N1330 Unspecified hydronephrosis: Secondary | ICD-10-CM | POA: Diagnosis not present

## 2024-06-20 LAB — POCT I-STAT CREATININE: Creatinine, Ser: 0.8 mg/dL (ref 0.44–1.00)

## 2024-06-20 MED ORDER — IOHEXOL 300 MG/ML  SOLN
100.0000 mL | Freq: Once | INTRAMUSCULAR | Status: AC | PRN
  Administered 2024-06-20: 100 mL via INTRAVENOUS

## 2024-06-24 ENCOUNTER — Other Ambulatory Visit (HOSPITAL_COMMUNITY): Payer: Self-pay

## 2024-06-24 DIAGNOSIS — R17 Unspecified jaundice: Secondary | ICD-10-CM

## 2024-06-27 ENCOUNTER — Other Ambulatory Visit (HOSPITAL_COMMUNITY): Payer: Self-pay

## 2024-06-27 ENCOUNTER — Inpatient Hospital Stay

## 2024-06-27 ENCOUNTER — Inpatient Hospital Stay: Attending: Oncology | Admitting: Oncology

## 2024-06-27 VITALS — BP 159/76 | HR 61 | Temp 98.0°F | Resp 17 | Ht 65.0 in

## 2024-06-27 DIAGNOSIS — R17 Unspecified jaundice: Secondary | ICD-10-CM | POA: Diagnosis not present

## 2024-06-27 DIAGNOSIS — R634 Abnormal weight loss: Secondary | ICD-10-CM | POA: Diagnosis not present

## 2024-06-27 DIAGNOSIS — K8689 Other specified diseases of pancreas: Secondary | ICD-10-CM | POA: Diagnosis not present

## 2024-06-27 NOTE — Assessment & Plan Note (Signed)
 Recent jaundice likely related to pancreatic mass.

## 2024-06-27 NOTE — Assessment & Plan Note (Addendum)
 CT scan with suspicious pancreatic mass and patients physical exam consistent with jaundice  The patient's recent CT imaging reveals findings highly suggestive of pancreatic cancer. Given the concerning nature of these results, further diagnostic evaluation with MRI and biopsy is needed to confirm the diagnosis. However, considering the patient's age and current functional status, she is not a candidate for surgical intervention or chemotherapy.  We engaged in a detailed discussion regarding the potential risks and benefits of pursuing aggressive treatments versus focusing on quality of life. After careful consideration, both the patient and her son have opted to forgo further diagnostic procedures at this time. They have chosen to prioritize comfort measures and quality of life.  A referral to hospice care has been initiated to provide supportive services tailored to the patient's needs.  However, genetic screening is essential in pancreatic cancer for ruling out germline mutations.  Will refer for genetic counseling

## 2024-06-27 NOTE — Assessment & Plan Note (Signed)
 Significant weight loss of 20-30 pounds over six months, likely secondary to concerning pancreatic cancer.

## 2024-06-27 NOTE — Progress Notes (Signed)
 Hematology-Oncology Clinic Note  System, Provider Not In   Reason for Referral: Pancreatic mass  Oncology History: I have reviewed her chart and materials related to her cancer extensively and collaborated history with the patient. Summary of oncologic history is as follows:  Oncology History  Pancreatic mass  06/20/2024 Imaging   IMPRESSION: 1. Soft tissue mass in the proximal pancreas most consistent with malignancy. MRI without and with contrast recommended for better evaluation. 2. Distended urinary bladder with findings suggestive of chronic bladder dysfunction. Large amount of air within the urinary bladder may be related to recent instrumentation or an infectious process. The possibility of a colovesical fistula is not entirely excluded.   06/27/2024 Initial Diagnosis   Pancreatic mass       History of Presenting Illness: Dana Ellis 89 y.o. female is here for pancreatic mass.  She is accompanied by her son today.  Patient has a past medical history of dementia, hypertension and hyperlipidemia.  Patient is a limited historian and most of the history is obtained from the son. Patient has been experiencing jaundice for about a week and went to the primary care who did a CT of her abdomen and pelvis which showed a pancreatic mass.She has experienced significant weight loss over the past six months, approximately 20 to 30 pounds, and has a decreased appetite.  She experiences pain in her back and knees, which restricts her mobility. She uses a walker to assist with movement around her house. Despite her limited mobility due to arthritis and back pain, she spends most of her time out of bed and uses a recliner chair.  She lives independently with assistance from her son and daughter-in-law, who help with meals and other daily activities. She has a history of limited smoking and has been separated from her husband for years. She has one son and other family members who are  involved in her care.  She does not drink alcohol .  She spends most of her day in her bed or recliner.  No known family history of cancer.    Medical History: Past Medical History:  Diagnosis Date   Arthritis    Hypertension     Surgical history: Past Surgical History:  Procedure Laterality Date   ABDOMINAL HYSTERECTOMY     JOINT REPLACEMENT     TOTAL KNEE ARTHROPLASTY       Allergies:  is allergic to ace inhibitors and pork-derived products.  Medications:  Current Outpatient Medications  Medication Sig Dispense Refill   amLODipine  (NORVASC ) 5 MG tablet Take 1 tablet (5 mg total) by mouth daily. 30 tablet 0   BD PEN NEEDLE NANO 2ND GEN 32G X 4 MM MISC daily.     calcium-vitamin D  (OSCAL WITH D) 500-200 MG-UNIT per tablet Take 1 tablet by mouth 2 (two) times daily.     citalopram  (CELEXA ) 20 MG tablet Take 1 tablet (20 mg total) by mouth daily. 30 tablet 0   cloNIDine  (CATAPRES ) 0.1 MG tablet Take 1 tablet (0.1 mg total) by mouth 2 (two) times daily. 60 tablet 0   Continuous Glucose Receiver (FREESTYLE LIBRE 3 READER) DEVI as directed.     Continuous Glucose Sensor (FREESTYLE LIBRE 3 PLUS SENSOR) MISC as directed.     CREON 24000-76000 units CPEP Take by mouth.     fluticasone (FLONASE) 50 MCG/ACT nasal spray Place 2 sprays into both nostrils daily as needed for rhinitis or allergies.     furosemide  (LASIX ) 20 MG tablet Take 1 tablet (  20 mg total) by mouth daily. 30 tablet 0   furosemide  (LASIX ) 40 MG tablet Take 40 mg by mouth daily.     insulin  glargine-yfgn (SEMGLEE ) 100 UNIT/ML Pen Inject 9 Units into the skin at bedtime. 2.7 mL 0   levocetirizine (XYZAL) 5 MG tablet Take 1 tablet by mouth daily.     meclizine (ANTIVERT) 12.5 MG tablet Take 12.5 mg by mouth daily.     meloxicam  (MOBIC ) 7.5 MG tablet Take 1 tablet (7.5 mg total) by mouth daily. 30 tablet 0   NOVOLOG  FLEXPEN 100 UNIT/ML FlexPen INJECT 4 UNITS EVERY DAY BY SUBCUTANEOUS ROUTE BEFORE MEAL(S), FOR DINNER.      olmesartan (BENICAR) 20 MG tablet Take 20 mg by mouth daily.     omeprazole (PRILOSEC) 20 MG capsule Take 20 mg by mouth daily.     polyethylene glycol (MIRALAX  / GLYCOLAX ) 17 g packet Take 17 g by mouth daily as needed for mild constipation.     potassium chloride  SA (KLOR-CON  M) 20 MEQ tablet Take 1 tablet (20 mEq total) by mouth daily. *Alternate taking one tablet daily and taking two tablets daily* 90 tablet 0   pravastatin  (PRAVACHOL ) 40 MG tablet Take 1 tablet (40 mg total) by mouth daily. 30 tablet 0   PREMARIN  vaginal cream PLEASE SEE ATTACHED FOR DETAILED DIRECTIONS     Protein (PROSOURCE PO) Take 30 mLs by mouth 3 (three) times daily.     TRESIBA FLEXTOUCH 100 UNIT/ML FlexTouch Pen INJECT 15 UNITS INTO THE SKIN ONCE DAILY     Vitamin D , Ergocalciferol , (DRISDOL) 1.25 MG (50000 UNIT) CAPS capsule Take 50,000 Units by mouth once a week.     No current facility-administered medications for this visit.    Review of Systems: Constitutional: Denies fevers, chills or abnormal night sweats Eyes: Denies blurriness of vision, double vision or watery eyes Ears, nose, mouth, throat, and face: Denies mucositis or sore throat Respiratory: Denies cough, dyspnea or wheezes Cardiovascular: Denies palpitation, chest discomfort or lower extremity swelling Gastrointestinal:  Denies nausea, heartburn or change in bowel habits Skin: Denies abnormal skin rashes Lymphatics: Denies new lymphadenopathy or easy bruising Neurological:Denies numbness, tingling or new weaknesses Behavioral/Psych: Mood is stable, no new changes  All other systems were reviewed with the patient and are negative.  Physical Examination: ECOG PERFORMANCE STATUS: 3 - Symptomatic, >50% confined to bed  Vitals:   06/27/24 1123  BP: (!) 159/76  Pulse: 61  Resp: 17  Temp: 98 F (36.7 C)  SpO2: 100%   There were no vitals filed for this visit.  GENERAL: Frail, alert, no distress and comfortable, in a wheelchair SKIN:  Jaundiced EYES: normal, conjunctiva are yellow and non-injected LYMPH:  no palpable lymphadenopathy in the cervical, axillary or inguinal LUNGS: clear to auscultation and percussion with normal breathing effort HEART: regular rate & rhythm and no murmurs and no lower extremity edema ABDOMEN:abdomen soft, non-tender and normal bowel sounds Musculoskeletal:no cyanosis of digits and no clubbing  PSYCH:Patient is slow to understanding and required repeating multiple times but I am unsure if she understands and comprehends fully. NEURO: no focal motor/sensory deficits  Laboratory Data: I have reviewed the data as listed  Lab Results  Component Value Date   WBC 7.3 10/19/2023   HGB 9.2 (L) 10/19/2023   HCT 29.8 (L) 10/19/2023   MCV 97.1 10/19/2023   PLT 392 10/19/2023   Recent Labs    10/08/23 1614 10/09/23 0532 10/16/23 0441 10/19/23 1356 11/02/23 0800 06/20/24 1621  NA 131*   < > 135 133* 137  --   K 2.5*   < > 4.0 4.7 3.6  --   CL 98   < > 103 104 106  --   CO2 22   < > 25 22 23   --   GLUCOSE 59*   < > 135* 189* 74  --   BUN 23   < > 13 27* 28*  --   CREATININE 0.98   < > 0.81 0.94 0.80 0.80  CALCIUM 8.3*   < > 8.5* 8.6* 8.1*  --   GFRNONAA 56*   < > >60 59* >60  --   PROT 6.4*  --   --  6.7  --   --   ALBUMIN 2.8*  --   --  2.6*  --   --   AST 51*  --   --  25  --   --   ALT 38  --   --  23  --   --   ALKPHOS 89  --   --  102  --   --   BILITOT 0.7  --   --  0.4  --   --    < > = values in this interval not displayed.    Radiographic Studies: I have personally reviewed the radiological images as listed and agreed with the findings in the report.  CT ABDOMEN PELVIS W CONTRAST CLINICAL DATA:  Painless jaundice.  EXAM: CT ABDOMEN AND PELVIS WITH CONTRAST  TECHNIQUE: Multidetector CT imaging of the abdomen and pelvis was performed using the standard protocol following bolus administration of intravenous contrast.  RADIATION DOSE REDUCTION: This exam was  performed according to the departmental dose-optimization program which includes automated exposure control, adjustment of the mA and/or kV according to patient size and/or use of iterative reconstruction technique.  CONTRAST:  OMNIPAQUE  IOHEXOL  300 MG/ML  SOLN  COMPARISON:  CT abdomen pelvis dated 06/25/2019  FINDINGS: Evaluation of this exam is limited due to respiratory motion and streak artifact caused by patient's arms.  Lower chest: The visualized lung bases are clear. There is coronary vascular calcification.  No intra-abdominal free air or free fluid.  Hepatobiliary: Heterogeneous enhancement of the liver. There is diffuse dilatation of the intrahepatic and extrahepatic biliary tree. The common bile duct measures 15 mm in diameter. No calcified gallstone.  Pancreas: There is a 2 x 5 cm mass in the proximal pancreas. There is dilatation of the main pancreatic duct with atrophy of the body and tail of the pancreas. MRI without and with contrast recommended for better evaluation of the pancreatic mass.  Spleen: Normal in size without focal abnormality.  Adrenals/Urinary Tract: The adrenal glands are not well visualized. Mild bilateral renal parenchyma atrophy and cortical scarring and irregularity. There is mild bilateral hydronephrosis. No obstructing stone noted. The urinary bladder is distended. There is trabeculated appearance of the bladder wall suggestive of chronic bladder dysfunction. Large amount of air within the urinary bladder may be related to recent instrumentation or an infectious process. The possibility of a colovesical fistula is not entirely excluded. Small pocket of air in the right renal collecting system, likely extension from the bladder.  Stomach/Bowel: Evaluation of the bowel is very limited in the absence of oral contrast and due to paucity of intra-abdominal fat. There is sigmoid diverticulosis. There is a large hiatal  hernia containing the majority of the stomach. No evidence of bowel obstruction.  Vascular/Lymphatic: Moderate aortoiliac  atherosclerotic disease. The IVC is unremarkable. No portal venous gas. No adenopathy.  Reproductive: Hysterectomy.  Other: Diffuse subcutaneous edema.  Musculoskeletal: Osteopenia with scoliosis and degenerative changes. No acute osseous pathology.  IMPRESSION: 1. Soft tissue mass in the proximal pancreas most consistent with malignancy. MRI without and with contrast recommended for better evaluation. 2. Distended urinary bladder with findings suggestive of chronic bladder dysfunction. Large amount of air within the urinary bladder may be related to recent instrumentation or an infectious process. The possibility of a colovesical fistula is not entirely excluded. 3. Mild bilateral hydronephrosis. No obstructing stone noted. 4. Sigmoid diverticulosis. No evidence of bowel obstruction. 5. Large hiatal hernia containing the majority of the stomach. 6.  Aortic Atherosclerosis (ICD10-I70.0).  Electronically Signed   By: Vanetta Chou M.D.   On: 06/20/2024 17:32    ASSESSMENT & PLAN:  Patient is a 89 y.o. female presenting for pancreatic mass  Pancreatic mass CT scan with suspicious pancreatic mass and patients physical exam consistent with jaundice  The patient's recent CT imaging reveals findings highly suggestive of pancreatic cancer. Given the concerning nature of these results, further diagnostic evaluation with MRI and biopsy is needed to confirm the diagnosis. However, considering the patient's age and current functional status, she is not a candidate for surgical intervention or chemotherapy.  We engaged in a detailed discussion regarding the potential risks and benefits of pursuing aggressive treatments versus focusing on quality of life. After careful consideration, both the patient and her son have opted to forgo further diagnostic procedures at  this time. They have chosen to prioritize comfort measures and quality of life.  A referral to hospice care has been initiated to provide supportive services tailored to the patient's needs.  However, genetic screening is essential in pancreatic cancer for ruling out germline mutations.  Will refer for genetic counseling  Weight loss Significant weight loss of 20-30 pounds over six months, likely secondary to concerning pancreatic cancer.   Jaundice Recent jaundice likely related to pancreatic mass.   No orders of the defined types were placed in this encounter.   The total time spent in the appointment was 60 minutes encounter with patients including review of chart and various tests results, discussions about plan of care and coordination of care plan   All questions were answered. The patient knows to call the clinic with any problems, questions or concerns. No barriers to learning was detected.  Mickiel Dry, MD 7/10/20253:55 PM

## 2024-07-04 ENCOUNTER — Other Ambulatory Visit (HOSPITAL_COMMUNITY)

## 2024-07-04 ENCOUNTER — Encounter (HOSPITAL_COMMUNITY): Payer: Self-pay

## 2024-07-05 DIAGNOSIS — R2689 Other abnormalities of gait and mobility: Secondary | ICD-10-CM | POA: Diagnosis not present

## 2024-07-05 DIAGNOSIS — R269 Unspecified abnormalities of gait and mobility: Secondary | ICD-10-CM | POA: Diagnosis not present

## 2024-07-05 DIAGNOSIS — R262 Difficulty in walking, not elsewhere classified: Secondary | ICD-10-CM | POA: Diagnosis not present

## 2024-07-05 DIAGNOSIS — M6281 Muscle weakness (generalized): Secondary | ICD-10-CM | POA: Diagnosis not present

## 2024-07-05 DIAGNOSIS — R2681 Unsteadiness on feet: Secondary | ICD-10-CM | POA: Diagnosis not present

## 2024-07-05 DIAGNOSIS — Z9181 History of falling: Secondary | ICD-10-CM | POA: Diagnosis not present

## 2024-07-05 DIAGNOSIS — R531 Weakness: Secondary | ICD-10-CM | POA: Diagnosis not present

## 2024-08-01 ENCOUNTER — Other Ambulatory Visit

## 2024-08-01 ENCOUNTER — Inpatient Hospital Stay: Admitting: Licensed Clinical Social Worker

## 2024-08-08 ENCOUNTER — Ambulatory Visit: Admitting: Cardiology

## 2024-08-19 DEATH — deceased
